# Patient Record
Sex: Male | Born: 1965 | Race: Black or African American | Hispanic: No | Marital: Single | Smoking: Never smoker
Health system: Southern US, Community
[De-identification: ages and names within clinical notes are randomized; demographics above are authoritative.]

## PROBLEM LIST (undated history)

## (undated) DIAGNOSIS — F1020 Alcohol dependence, uncomplicated: Secondary | ICD-10-CM

## (undated) DIAGNOSIS — F1010 Alcohol abuse, uncomplicated: Secondary | ICD-10-CM

## (undated) DIAGNOSIS — K7460 Unspecified cirrhosis of liver: Secondary | ICD-10-CM

## (undated) DIAGNOSIS — K7030 Alcoholic cirrhosis of liver without ascites: Secondary | ICD-10-CM

## (undated) DIAGNOSIS — M1990 Unspecified osteoarthritis, unspecified site: Secondary | ICD-10-CM

---

## 2004-02-07 ENCOUNTER — Emergency Department: Payer: Self-pay | Admitting: Emergency Medicine

## 2010-10-25 ENCOUNTER — Emergency Department: Payer: Self-pay | Admitting: Emergency Medicine

## 2012-12-09 ENCOUNTER — Emergency Department: Payer: Self-pay | Admitting: Emergency Medicine

## 2014-11-28 ENCOUNTER — Emergency Department
Admission: EM | Admit: 2014-11-28 | Discharge: 2014-11-28 | Disposition: A | Discharge status: Home / Self Care | Payer: Self-pay | Attending: Emergency Medicine | Admitting: Emergency Medicine

## 2014-11-28 DIAGNOSIS — F10120 Alcohol abuse with intoxication, uncomplicated: Secondary | ICD-10-CM | POA: Insufficient documentation

## 2014-11-28 DIAGNOSIS — F10920 Alcohol use, unspecified with intoxication, uncomplicated: Secondary | ICD-10-CM

## 2014-11-28 LAB — BASIC METABOLIC PANEL
Anion gap: 16 — ABNORMAL HIGH (ref 5–15)
BUN: 7 mg/dL (ref 6–20)
CALCIUM: 8.5 mg/dL — AB (ref 8.9–10.3)
CHLORIDE: 98 mmol/L — AB (ref 101–111)
CO2: 22 mmol/L (ref 22–32)
CREATININE: 0.64 mg/dL (ref 0.61–1.24)
GFR calc Af Amer: >60 mL/min (ref >60)
GFR calc non Af Amer: >60 mL/min (ref >60)
Glucose, Bld: 153 mg/dL — ABNORMAL HIGH (ref 65–99)
Potassium: 3.4 mmol/L — ABNORMAL LOW (ref 3.5–5.1)
SODIUM: 136 mmol/L (ref 135–145)

## 2014-11-28 LAB — ETHANOL: Alcohol, Ethyl (B): 387 mg/dL (ref <5)

## 2014-11-28 MED ORDER — SODIUM CHLORIDE 0.9 % IV BOLUS (SEPSIS)
1000.0000 mL | Freq: Once | INTRAVENOUS | Status: AC
  Administered 2014-11-28: 1000 mL via INTRAVENOUS

## 2014-11-28 NOTE — ED Notes (Signed)
Patient brought in by [place] police for to be cleared for jail.

## 2014-11-28 NOTE — ED Provider Notes (Signed)
-----------------------------------------   8:23 AM on 11/28/2014 -----------------------------------------  Police breathalyzer indicates alcohol level of 210, well below 300. Patient fully awake and alert reports that he feels ready to go. Heart rate 95, fully awake and alert in no distress. He'll be going in the custody of the police to jail.  Alcohol intoxication return precautions given and follow-up with [facility] advised once he is out of custody of the police.  Delman Kitten, MD 11/28/14 [phone]

## 2014-11-28 NOTE — ED Notes (Signed)
Pt placed on O2 [place] 2 LPM for oxygen saturation on 92 in RA.

## 2014-11-28 NOTE — Discharge Instructions (Signed)
Alcohol Intoxication Alcohol intoxication occurs when the amount of alcohol that a person has consumed impairs his or her ability to mentally and physically function. Alcohol directly impairs the normal chemical activity of the brain. Drinking large amounts of alcohol can lead to changes in mental function and behavior, and it can cause many physical effects that can be harmful.  Alcohol intoxication can range in severity from mild to very severe. Various factors can affect the level of intoxication that occurs, such as the person's age, gender, weight, frequency of alcohol consumption, and the presence of other medical conditions (such as diabetes, seizures, or heart conditions). Dangerous levels of alcohol intoxication may occur when people drink large amounts of alcohol in a short period (binge drinking). Alcohol can also be especially dangerous when combined with certain prescription medicines or "recreational" drugs. SIGNS AND SYMPTOMS Some common signs and symptoms of mild alcohol intoxication include:  Loss of coordination.  Changes in mood and behavior.  Impaired judgment.  Slurred speech. As alcohol intoxication progresses to more severe levels, other signs and symptoms will appear. These may include:  Vomiting.  Confusion and impaired memory.  Slowed breathing.  Seizures.  Loss of consciousness. DIAGNOSIS  Your health care provider will take a medical history and perform a physical exam. You will be asked about the amount and type of alcohol you have consumed. Blood tests will be done to measure the concentration of alcohol in your blood. In many places, your blood alcohol level must be lower than 80 mg/dL (0.08%) to legally drive. However, many dangerous effects of alcohol can occur at much lower levels.  TREATMENT  People with alcohol intoxication often do not require treatment. Most of the effects of alcohol intoxication are temporary, and they go away as the alcohol naturally  leaves the body. Your health care provider will monitor your condition until you are stable enough to go home. Fluids are sometimes given through an IV access tube to help prevent dehydration.  HOME CARE INSTRUCTIONS  Do not drive after drinking alcohol.  Stay hydrated. Drink enough water and fluids to keep your urine clear or pale yellow. Avoid caffeine.   Only take over-the-counter or prescription medicines as directed by your health care provider.  SEEK MEDICAL CARE IF:   You have persistent vomiting.   You do not feel better after a few days.  You have frequent alcohol intoxication. Your health care provider can help determine if you should see a substance use treatment counselor. SEEK IMMEDIATE MEDICAL CARE IF:   You become shaky or tremble when you try to stop drinking.   You shake uncontrollably (seizure).   You throw up (vomit) blood. This may be bright red or may look like black coffee grounds.   You have blood in your stool. This may be bright red or may appear as a black, tarry, bad smelling stool.   You become lightheaded or faint.  MAKE SURE YOU:   Understand these instructions.  Will watch your condition.  Will get help right away if you are not doing well or get worse.   This information is not intended to replace advice given to you by your health care provider. Make sure you discuss any questions you have with your health care provider.   Document Released: 10/04/2004 Document Revised: 08/27/2012 Document Reviewed: 05/30/2012 Elsevier Interactive Patient Education 2016 [employer]  Ethanol Test WHY AM I HAVING THIS TEST? This test is used to determine whether your blood alcohol level can impair  your ability to drive. It can also determine the possibility of overdose. WHAT KIND OF SAMPLE IS TAKEN? A blood sample is required for this test. It is usually collected by inserting a needle into a vein. HOW DO I PREPARE FOR THE TEST? There is no  preparation required for this test. WHAT ARE THE REFERENCE RANGES? Reference ranges are considered healthy ranges established after testing a large group of healthy people. Reference ranges may vary among different people, labs, and hospitals. It is your responsibility to obtain your test results. Ask the lab or department performing the test when and how you will get your test results. Reference ranges are 0-50 mg/dL or 0-0.05%. WHAT DO THE RESULTS MEAN? If your results are:  Greater than or equal to 80 mg/dL or 0.08%, this indicates legal intoxication.  80-400 mg/dL or 0.08-0.4%, this means that you have increased intoxication. You may also have depressed central nervous response.  Greater than 400 mg/dL or 0.4%, this indicates possible alcohol overdose. Talk with your health care provider to discuss your results, treatment options, and if necessary, the need for more tests. Talk with your health care provider if you have any questions about your results.   This information is not intended to replace advice given to you by your health care provider. Make sure you discuss any questions you have with your health care provider.   Document Released: 01/18/2004 Document Revised: 01/15/2014 Document Reviewed: 06/16/2013 Elsevier Interactive Patient Education [employer].

## 2014-11-28 NOTE — ED Provider Notes (Signed)
[facility] Emergency Department Provider Note  ____________________________________________  Time seen: Approximately [id number] AM  I have reviewed the triage vital signs and the nursing notes.   HISTORY  Chief Complaint Alcohol Intoxication    HPI Jordan Hayden is a 49 y.o. male who was brought in by police with alcohol intoxication. The patient reports he was at a cookout and he had some liquor. The patient reports that he feels better that he had when he first came in. The patient is unsure how much she was drinking. He reports that he drinks about a beer daily. The patient denies any vomiting or dizziness. He also denies any smoking. The patient has some left knee pain with some mild swelling that he is unsure what the cause. The patient was brought in by the police because when they took him to jail his blood alcohol level was initially in the 340s and then seemed to rise into the 450s. The patient's alcohol level was too high for him to be safely monitored details and he was brought in here for medical clearance and will be taken back once he is medically cleared. The patient has no complaints or concerns at this time and is sitting without any distress on the stretcher.   No past medical history  There are no active problems to display for this patient.   No past surgical history   No current outpatient prescriptions on file.  Allergies No known drug allergies  No family history on file.  Social History Social History  Substance Use Topics  . Smoking status: Smoker  . Smokeless tobacco: Not on file  . Alcohol Use: Daily Drinker    Review of Systems Constitutional: No fever/chills Eyes: No visual changes. ENT: No sore throat. Cardiovascular: Denies chest pain. Respiratory: Denies shortness of breath. Gastrointestinal: No abdominal pain.  No nausea, no vomiting.  No diarrhea.  No constipation. Genitourinary: Negative for  dysuria. Musculoskeletal: Negative for back pain. Skin: Negative for rash. Neurological: Negative for headaches, focal weakness or numbness.  10-point ROS otherwise negative.  ____________________________________________   PHYSICAL EXAM:  VITAL SIGNS: ED Triage Vitals  Enc Vitals Group     BP 11/28/14 0516 157/91 mmHg     Pulse Rate 11/28/14 0516 122     Resp 11/28/14 0516 20     Temp 11/28/14 0516 98.1 F (36.7 C)     Temp Source 11/28/14 0516 Oral     SpO2 11/28/14 0516 94 %     Weight 11/28/14 0515 185 lb (83.915 kg)     Height 11/28/14 0515 5\' 8"  (1.727 m)     Head Cir --      Peak Flow --      Pain Score 11/28/14 0518 8     Pain Loc --      Pain Edu? --      Excl. in [place]? --     Constitutional: Alert and oriented. Well appearing and in no acute distress. Eyes: Conjunctivae are normal. PERRL. EOMI. Head: Atraumatic. Nose: No congestion/rhinnorhea. Mouth/Throat: Mucous membranes are moist.  Oropharynx non-erythematous. Cardiovascular: Normal rate, regular rhythm. Grossly normal heart sounds.  Good peripheral circulation. Respiratory: Normal respiratory effort.  No retractions. Lungs CTAB. Gastrointestinal: Soft and nontender. No distention. Positive bowel sounds Musculoskeletal: No lower extremity tenderness nor edema.   Neurologic:  Normal speech and language.  Skin:  Skin is warm, dry and intact.  Psychiatric: Mood and affect are normal.   ____________________________________________   LABS (all labs  ordered are listed, but only abnormal results are displayed)  Labs Reviewed  ETHANOL - Abnormal; Notable for the following:    Alcohol, Ethyl (B) 387 (*)    All other components within normal limits  BASIC METABOLIC PANEL - Abnormal; Notable for the following:    Potassium 3.4 (*)    Chloride 98 (*)    Glucose, Bld 153 (*)    Calcium 8.5 (*)    Anion gap 16 (*)    All other components within normal limits    ____________________________________________  EKG  none ____________________________________________  RADIOLOGY  none ____________________________________________   PROCEDURES  Procedure(s) performed: None  Critical Care performed: No  ____________________________________________   INITIAL IMPRESSION / ASSESSMENT AND PLAN / ED COURSE  Pertinent labs & imaging results that were available during my care of the patient were reviewed by me and considered in my medical decision making (see chart for details).  This is a patient who is intoxicated in the custody of police. We will give him some fluid and reassess the patient.  The patient will be reassessed to see if he is able to be discharged into the care of the police. Dr. Jacqualine Code will follow up the results of the breathalyzer. He continues to be in no distress.  ____________________________________________   FINAL CLINICAL IMPRESSION(S) / ED DIAGNOSES  Final diagnoses:  Alcohol intoxication, uncomplicated (HCC)      Loney Hering, MD 11/28/14 [phone]

## 2014-11-28 NOTE — ED Notes (Signed)
Pt arrived to the ED under [employer] custody for medical clearance for jail. Pt is AOx4 anxious during assessment. No other complaints during assessment.

## 2015-12-29 ENCOUNTER — Emergency Department
Admission: EM | Admit: 2015-12-29 | Discharge: 2015-12-29 | Disposition: A | Discharge status: Home / Self Care | Payer: Medicaid Other | Attending: Emergency Medicine | Admitting: Emergency Medicine

## 2015-12-29 ENCOUNTER — Emergency Department: Payer: Medicaid Other

## 2015-12-29 DIAGNOSIS — K7031 Alcoholic cirrhosis of liver with ascites: Secondary | ICD-10-CM | POA: Insufficient documentation

## 2015-12-29 DIAGNOSIS — R609 Edema, unspecified: Secondary | ICD-10-CM | POA: Diagnosis not present

## 2015-12-29 DIAGNOSIS — M7989 Other specified soft tissue disorders: Secondary | ICD-10-CM | POA: Diagnosis present

## 2015-12-29 LAB — COMPREHENSIVE METABOLIC PANEL
ALT: 23 U/L (ref 17–63)
AST: 82 U/L — ABNORMAL HIGH (ref 15–41)
Albumin: 1.9 g/dL — ABNORMAL LOW (ref 3.5–5.0)
Alkaline Phosphatase: 176 U/L — ABNORMAL HIGH (ref 38–126)
Anion gap: 5 (ref 5–15)
BILIRUBIN TOTAL: 13.2 mg/dL — AB (ref 0.3–1.2)
BUN: <5 mg/dL — ABNORMAL LOW (ref 6–20)
CHLORIDE: 100 mmol/L — AB (ref 101–111)
CO2: 27 mmol/L (ref 22–32)
Calcium: 8.1 mg/dL — ABNORMAL LOW (ref 8.9–10.3)
Creatinine, Ser: 0.4 mg/dL — ABNORMAL LOW (ref 0.61–1.24)
Glucose, Bld: 103 mg/dL — ABNORMAL HIGH (ref 65–99)
POTASSIUM: 3.3 mmol/L — AB (ref 3.5–5.1)
Sodium: 132 mmol/L — ABNORMAL LOW (ref 135–145)
TOTAL PROTEIN: 7.4 g/dL (ref 6.5–8.1)

## 2015-12-29 LAB — CBC
HCT: 33.5 % — ABNORMAL LOW (ref 40.0–52.0)
Hemoglobin: 11.8 g/dL — ABNORMAL LOW (ref 13.0–18.0)
MCH: 36.3 pg — AB (ref 26.0–34.0)
MCHC: 35.4 g/dL (ref 32.0–36.0)
MCV: 102.5 fL — AB (ref 80.0–100.0)
PLATELETS: 84 10*3/uL — AB (ref 150–440)
RBC: 3.26 MIL/uL — ABNORMAL LOW (ref 4.40–5.90)
RDW: 18.7 % — AB (ref 11.5–14.5)
WBC: 7.7 10*3/uL (ref 3.8–10.6)

## 2015-12-29 LAB — BRAIN NATRIURETIC PEPTIDE: B NATRIURETIC PEPTIDE 5: 78 pg/mL (ref 0.0–100.0)

## 2015-12-29 LAB — PROTIME-INR
INR: 1.81
Prothrombin Time: 21.2 seconds — ABNORMAL HIGH (ref 11.4–15.2)

## 2015-12-29 LAB — ETHANOL

## 2015-12-29 LAB — TROPONIN I: Troponin I: <0.03 ng/mL (ref <0.03)

## 2015-12-29 MED ORDER — FUROSEMIDE 20 MG PO TABS: 20.0000 mg | ORAL_TABLET | Freq: Every day | ORAL | 11 refills | Status: DC

## 2015-12-29 MED ORDER — SPIRONOLACTONE 25 MG PO TABS: 25.0000 mg | ORAL_TABLET | Freq: Every day | ORAL | 11 refills | Status: DC

## 2015-12-29 NOTE — ED Triage Notes (Signed)
Pt ibn in with co bilat lower leg edema since last week. No hx of the same, denies any pain or shob.

## 2015-12-29 NOTE — ED Provider Notes (Signed)
[facility] Emergency Department Provider Note        Time seen: ----------------------------------------- 7:59 PM on 12/29/2015 -----------------------------------------    I have reviewed the triage vital signs and the nursing notes.   HISTORY  Chief Complaint Leg Swelling    HPI Jordan Hayden is a 50 y.o. male who presents to ER with bilateral lower extremity edema for the last week. He has not had a history of same, denies fluid or weight gain. He denies fevers, chills, chest pain or shortness of breath. Patient reports frequent alcohol intake but denies alcohol intake daily.   No past medical history on file.  There are no active problems to display for this patient.   No past surgical history on file.  Allergies Patient has no known allergies.  Social History Social History  Substance Use Topics  . Smoking status: Not on file  . Smokeless tobacco: Not on file  . Alcohol use Not on file    Review of Systems Constitutional: Negative for fever. Cardiovascular: Negative for chest pain. Respiratory: Negative for shortness of breath. Gastrointestinal: Negative for abdominal pain, vomiting and diarrhea. Genitourinary: Negative for dysuria. Musculoskeletal: Negative for back pain.Positive for lower extremity edema Skin: Negative for rash. Neurological: Negative for headaches, focal weakness or numbness.  10-point ROS otherwise negative.  ____________________________________________   PHYSICAL EXAM:  VITAL SIGNS: ED Triage Vitals [12/29/15 1905]  Enc Vitals Group     BP (!) 149/86     Pulse Rate 95     Resp 18     Temp 99.6 F (37.6 C)     Temp Source Oral     SpO2 100 %     Weight 190 lb (86.2 kg)     Height 5\' 8"  (1.727 m)     Head Circumference      Peak Flow      Pain Score      Pain Loc      Pain Edu?      Excl. in [place]?     Constitutional: Alert and oriented. Well appearing and in no distress. Eyes:  Conjunctivae are normal. PERRL. Normal extraocular movements. ENT   Head: Normocephalic and atraumatic.   Nose: No congestion/rhinnorhea.   Mouth/Throat: Mucous membranes are moist.   Neck: No stridor. Cardiovascular: Normal rate, regular rhythm. No murmurs, rubs, or gallops. Respiratory: Normal respiratory effort without tachypnea nor retractions. Breath sounds are clear and equal bilaterally. No wheezes/rales/rhonchi. Gastrointestinal: Soft and nontender. Normal bowel sounds Musculoskeletal: Nontender with normal range of motion in all extremities. Bilateral lower extremity pitting edema to the knees Neurologic:  Normal speech and language. No gross focal neurologic deficits are appreciated.  Skin:  Skin is warm, dry and intact. No rash noted. Psychiatric: Mood and affect are normal. Speech and behavior are normal.  ____________________________________________  EKG: Interpreted by me. Sinus rhythm rate 88 bpm, normal PR interval, normal QRS, normal QT, normal axis.  ____________________________________________  ED COURSE:  Pertinent labs & imaging results that were available during my care of the patient were reviewed by me and considered in my medical decision making (see chart for details). Clinical Course   Patient presents to the ER with edema which is likely cirrhosis related. We will assess with labs and reevaluate.  Procedures ____________________________________________   LABS (pertinent positives/negatives)  Labs Reviewed  CBC - Abnormal; Notable for the following:       Result Value   RBC 3.26 (*)    Hemoglobin 11.8 (*)  HCT 33.5 (*)    MCV 102.5 (*)    MCH 36.3 (*)    RDW 18.7 (*)    Platelets 84 (*)    All other components within normal limits  COMPREHENSIVE METABOLIC PANEL - Abnormal; Notable for the following:    Sodium 132 (*)    Potassium 3.3 (*)    Chloride 100 (*)    Glucose, Bld 103 (*)    BUN <5 (*)    Creatinine, Ser 0.40 (*)     Calcium 8.1 (*)    Albumin 1.9 (*)    AST 82 (*)    Alkaline Phosphatase 176 (*)    Total Bilirubin 13.2 (*)    All other components within normal limits  PROTIME-INR - Abnormal; Notable for the following:    Prothrombin Time 21.2 (*)    All other components within normal limits  BRAIN NATRIURETIC PEPTIDE  TROPONIN I  ETHANOL  URINALYSIS, COMPLETE (UACMP) WITH MICROSCOPIC    RADIOLOGY Images were viewed by me  Right upper quadrant ultrasound IMPRESSION: 1. Advanced cirrhosis with moderate ascites suggesting portal hypertension. 2. Gallbladder sludge with nonspecific gallbladder wall thickening (likely due to liver disease and ascites). No biliary dilatation or sonographic Murphy's sign.  ____________________________________________  FINAL ASSESSMENT AND PLAN  Cirrhosis, Peripheral edema, hyperbilirubinemia  Plan: Patient with labs and imaging as dictated above. Patient presents to the ER with symptoms and signs of cirrhosis. Ultrasound findings confirmed the same. I have advised the patient that he needs to be admitted and have his liver more thoroughly evaluated for GI consultation. He has declined and is leaving [place]. I will offer him diuretics to start taking and he is advised to return for worsening symptoms.   Earleen Newport, MD   Note: This dictation was prepared with Dragon dictation. Any transcriptional errors that result from this process are unintentional    Earleen Newport, MD 12/29/15 2223

## 2016-02-17 DIAGNOSIS — M720 Palmar fascial fibromatosis [Dupuytren]: Secondary | ICD-10-CM | POA: Insufficient documentation

## 2016-02-17 DIAGNOSIS — K7460 Unspecified cirrhosis of liver: Secondary | ICD-10-CM | POA: Insufficient documentation

## 2016-02-28 ENCOUNTER — Other Ambulatory Visit: Payer: Self-pay | Admitting: Gastroenterology

## 2016-02-28 ENCOUNTER — Ambulatory Visit
Admission: RE | Admit: 2016-02-28 | Discharge: 2016-02-28 | Disposition: A | Discharge status: Home / Self Care | Payer: Medicaid Other | Source: Ambulatory Visit | Attending: Gastroenterology | Admitting: Gastroenterology

## 2016-02-28 DIAGNOSIS — D6959 Other secondary thrombocytopenia: Secondary | ICD-10-CM | POA: Diagnosis present

## 2016-02-28 DIAGNOSIS — Z79899 Other long term (current) drug therapy: Secondary | ICD-10-CM

## 2016-02-28 DIAGNOSIS — E8809 Other disorders of plasma-protein metabolism, not elsewhere classified: Secondary | ICD-10-CM | POA: Diagnosis present

## 2016-02-28 DIAGNOSIS — D689 Coagulation defect, unspecified: Secondary | ICD-10-CM | POA: Diagnosis present

## 2016-02-28 DIAGNOSIS — Z833 Family history of diabetes mellitus: Secondary | ICD-10-CM

## 2016-02-28 DIAGNOSIS — K7031 Alcoholic cirrhosis of liver with ascites: Principal | ICD-10-CM | POA: Diagnosis present

## 2016-02-28 DIAGNOSIS — R601 Generalized edema: Secondary | ICD-10-CM | POA: Diagnosis present

## 2016-02-28 DIAGNOSIS — N5089 Other specified disorders of the male genital organs: Secondary | ICD-10-CM | POA: Diagnosis present

## 2016-02-28 DIAGNOSIS — D539 Nutritional anemia, unspecified: Secondary | ICD-10-CM | POA: Diagnosis present

## 2016-02-28 DIAGNOSIS — Z8249 Family history of ischemic heart disease and other diseases of the circulatory system: Secondary | ICD-10-CM

## 2016-02-28 DIAGNOSIS — E876 Hypokalemia: Secondary | ICD-10-CM | POA: Diagnosis present

## 2016-02-28 DIAGNOSIS — J90 Pleural effusion, not elsewhere classified: Secondary | ICD-10-CM | POA: Diagnosis present

## 2016-02-28 LAB — ALBUMIN, PLEURAL OR PERITONEAL FLUID

## 2016-02-28 LAB — PROTEIN, PLEURAL OR PERITONEAL FLUID

## 2016-02-28 NOTE — Discharge Instructions (Signed)
Paracentesis, Care After °Introduction °Refer to this sheet in the next few weeks. These instructions provide you with information about caring for yourself after your procedure. Your health care provider may also give you more specific instructions. Your treatment has been planned according to current medical practices, but problems sometimes occur. Call your health care provider if you have any problems or questions after your procedure. °What can I expect after the procedure? °After your procedure, it is common to have a small amount of clear fluid coming from the puncture site. °Follow these instructions at home: °· Return to your normal activities as told by your health care provider. Ask your health care provider what activities are safe for you. °· Take over-the-counter and prescription medicines only as told by your health care provider. °· Do not take baths, swim, or use a hot tub until your health care provider approves. °· Follow instructions from your health care provider about: °¨ How to take care of your puncture site. °¨ When and how you should change your bandage (dressing). °¨ When you should remove your dressing. °· Check your puncture area every day signs of infection. Watch for: °¨ Redness, swelling, or pain. °¨ Fluid, blood, or pus. °· Keep all follow-up visits as told by your health care provider. This is important. °Contact a health care provider if: °· You have redness, swelling, or pain at your puncture site. °· You start to have more clear fluid coming from your puncture site. °· You have blood or pus coming from your puncture site. °· You have chills. °· You have a fever. °Get help right away if: °· You develop chest pain or shortness of breath. °· You develop increasing pain, discomfort, or swelling in your abdomen. °· You feel dizzy or light-headed or you pass out. °This information is not intended to replace advice given to you by your health care provider. Make sure you discuss any  questions you have with your health care provider. °Document Released: 05/11/2014 Document Revised: 06/02/2015 Document Reviewed: 03/09/2014 °© 2017 Elsevier ° °

## 2016-02-29 ENCOUNTER — Inpatient Hospital Stay
Admission: EM | Admit: 2016-02-29 | Discharge: 2016-03-05 | DRG: 433 | Disposition: A | Discharge status: Home / Self Care | Payer: Medicaid Other | Attending: Internal Medicine | Admitting: Internal Medicine

## 2016-02-29 ENCOUNTER — Encounter: Payer: Self-pay | Admitting: *Deleted

## 2016-02-29 DIAGNOSIS — I5189 Other ill-defined heart diseases: Secondary | ICD-10-CM

## 2016-02-29 DIAGNOSIS — Z8249 Family history of ischemic heart disease and other diseases of the circulatory system: Secondary | ICD-10-CM | POA: Diagnosis not present

## 2016-02-29 DIAGNOSIS — E876 Hypokalemia: Secondary | ICD-10-CM | POA: Diagnosis present

## 2016-02-29 DIAGNOSIS — J90 Pleural effusion, not elsewhere classified: Secondary | ICD-10-CM | POA: Diagnosis present

## 2016-02-29 DIAGNOSIS — R601 Generalized edema: Secondary | ICD-10-CM

## 2016-02-29 DIAGNOSIS — D539 Nutritional anemia, unspecified: Secondary | ICD-10-CM | POA: Diagnosis present

## 2016-02-29 DIAGNOSIS — R140 Abdominal distension (gaseous): Secondary | ICD-10-CM

## 2016-02-29 DIAGNOSIS — Z79899 Other long term (current) drug therapy: Secondary | ICD-10-CM | POA: Diagnosis not present

## 2016-02-29 DIAGNOSIS — N5089 Other specified disorders of the male genital organs: Secondary | ICD-10-CM | POA: Diagnosis present

## 2016-02-29 DIAGNOSIS — R188 Other ascites: Secondary | ICD-10-CM

## 2016-02-29 DIAGNOSIS — D689 Coagulation defect, unspecified: Secondary | ICD-10-CM | POA: Diagnosis present

## 2016-02-29 DIAGNOSIS — Z833 Family history of diabetes mellitus: Secondary | ICD-10-CM | POA: Diagnosis not present

## 2016-02-29 DIAGNOSIS — D6959 Other secondary thrombocytopenia: Secondary | ICD-10-CM | POA: Diagnosis present

## 2016-02-29 DIAGNOSIS — K7031 Alcoholic cirrhosis of liver with ascites: Secondary | ICD-10-CM | POA: Diagnosis not present

## 2016-02-29 DIAGNOSIS — E8809 Other disorders of plasma-protein metabolism, not elsewhere classified: Secondary | ICD-10-CM | POA: Diagnosis present

## 2016-02-29 HISTORY — DX: Alcohol abuse, uncomplicated: F10.10

## 2016-02-29 HISTORY — DX: Unspecified cirrhosis of liver: K74.60

## 2016-02-29 LAB — COMPREHENSIVE METABOLIC PANEL
ALBUMIN: 1.7 g/dL — AB (ref 3.5–5.0)
ALT: 22 U/L (ref 17–63)
ANION GAP: 6 (ref 5–15)
AST: 74 U/L — ABNORMAL HIGH (ref 15–41)
Alkaline Phosphatase: 184 U/L — ABNORMAL HIGH (ref 38–126)
BILIRUBIN TOTAL: 6.1 mg/dL — AB (ref 0.3–1.2)
BUN: 7 mg/dL (ref 6–20)
CHLORIDE: 103 mmol/L (ref 101–111)
CO2: 27 mmol/L (ref 22–32)
Calcium: 7.9 mg/dL — ABNORMAL LOW (ref 8.9–10.3)
Creatinine, Ser: 1.02 mg/dL (ref 0.61–1.24)
GFR calc Af Amer: >60 mL/min (ref >60)
GFR calc non Af Amer: >60 mL/min (ref >60)
GLUCOSE: 106 mg/dL — AB (ref 65–99)
POTASSIUM: 3.3 mmol/L — AB (ref 3.5–5.1)
SODIUM: 136 mmol/L (ref 135–145)
TOTAL PROTEIN: 7.1 g/dL (ref 6.5–8.1)

## 2016-02-29 LAB — DIFFERENTIAL
Basophils Absolute: 0.1 10*3/uL (ref 0–0.1)
Basophils Relative: 1 %
EOS PCT: 5 %
Eosinophils Absolute: 0.3 10*3/uL (ref 0–0.7)
LYMPHS ABS: 1.2 10*3/uL (ref 1.0–3.6)
LYMPHS PCT: 20 %
MONO ABS: 0.5 10*3/uL (ref 0.2–1.0)
MONOS PCT: 9 %
NEUTROS ABS: 4.1 10*3/uL (ref 1.4–6.5)
Neutrophils Relative %: 65 %

## 2016-02-29 LAB — CBC
HEMATOCRIT: 32.1 % — AB (ref 40.0–52.0)
HEMOGLOBIN: 11.2 g/dL — AB (ref 13.0–18.0)
MCH: 36.3 pg — ABNORMAL HIGH (ref 26.0–34.0)
MCHC: 35 g/dL (ref 32.0–36.0)
MCV: 103.9 fL — ABNORMAL HIGH (ref 80.0–100.0)
Platelets: 84 10*3/uL — ABNORMAL LOW (ref 150–440)
RBC: 3.09 MIL/uL — ABNORMAL LOW (ref 4.40–5.90)
RDW: 15.9 % — AB (ref 11.5–14.5)
WBC: 6.2 10*3/uL (ref 3.8–10.6)

## 2016-02-29 LAB — MISC LABCORP TEST (SEND OUT): Labcorp test code: 19588

## 2016-02-29 LAB — TSH: TSH: 3.601 u[IU]/mL (ref 0.350–4.500)

## 2016-02-29 LAB — PROTIME-INR
INR: 2.2
Prothrombin Time: 24.8 seconds — ABNORMAL HIGH (ref 11.4–15.2)

## 2016-02-29 LAB — CYTOLOGY - NON PAP

## 2016-02-29 LAB — APTT: aPTT: 47 seconds — ABNORMAL HIGH (ref 24–36)

## 2016-02-29 MED ORDER — ONDANSETRON HCL 4 MG PO TABS: 4.0000 mg | ORAL_TABLET | Freq: Four times a day (QID) | ORAL | Status: DC | PRN

## 2016-02-29 MED ORDER — ONDANSETRON HCL 4 MG/2ML IJ SOLN: 4.0000 mg | Freq: Four times a day (QID) | INTRAMUSCULAR | Status: DC | PRN

## 2016-02-29 MED ORDER — SPIRONOLACTONE 25 MG PO TABS
25.0000 mg | ORAL_TABLET | Freq: Every day | ORAL | Status: DC
  Administered 2016-02-29 – 2016-03-02 (×3): 25 mg via ORAL
  Filled 2016-02-29 (×4): qty 1

## 2016-02-29 MED ORDER — FUROSEMIDE 10 MG/ML IJ SOLN
60.0000 mg | Freq: Two times a day (BID) | INTRAMUSCULAR | Status: DC
  Administered 2016-03-01 – 2016-03-03 (×5): 60 mg via INTRAVENOUS
  Filled 2016-02-29 (×5): qty 6

## 2016-02-29 MED ORDER — ACETAMINOPHEN 325 MG PO TABS: 650.0000 mg | ORAL_TABLET | Freq: Four times a day (QID) | ORAL | Status: DC | PRN

## 2016-02-29 MED ORDER — SODIUM CHLORIDE 0.9% FLUSH
3.0000 mL | INTRAVENOUS | Status: DC | PRN
  Administered 2016-03-03: 3 mL via INTRAVENOUS
  Filled 2016-02-29: qty 3

## 2016-02-29 MED ORDER — HEPARIN SODIUM (PORCINE) 5000 UNIT/ML IJ SOLN
5000.0000 [IU] | Freq: Three times a day (TID) | INTRAMUSCULAR | Status: DC
  Administered 2016-02-29 – 2016-03-04 (×9): 5000 [IU] via SUBCUTANEOUS
  Filled 2016-02-29 (×9): qty 1

## 2016-02-29 MED ORDER — ACETAMINOPHEN 650 MG RE SUPP: 650.0000 mg | Freq: Four times a day (QID) | RECTAL | Status: DC | PRN

## 2016-02-29 MED ORDER — FUROSEMIDE 10 MG/ML IJ SOLN
60.0000 mg | Freq: Once | INTRAMUSCULAR | Status: AC
  Administered 2016-02-29: 60 mg via INTRAVENOUS
  Filled 2016-02-29: qty 8

## 2016-02-29 MED ORDER — SODIUM CHLORIDE 0.9% FLUSH
3.0000 mL | Freq: Two times a day (BID) | INTRAVENOUS | Status: DC
  Administered 2016-02-29 – 2016-03-04 (×9): 3 mL via INTRAVENOUS

## 2016-02-29 MED ORDER — SODIUM CHLORIDE 0.9 % IV SOLN: 250.0000 mL | INTRAVENOUS | Status: DC | PRN

## 2016-02-29 MED ORDER — ALBUMIN HUMAN 25 % IV SOLN
25.0000 g | Freq: Four times a day (QID) | INTRAVENOUS | Status: AC
  Administered 2016-02-29 – 2016-03-01 (×3): 25 g via INTRAVENOUS
  Filled 2016-02-29 (×4): qty 100

## 2016-02-29 NOTE — ED Triage Notes (Signed)
Pt has cirrhosis, pt has a paracentesis yesterday, pt sent from [facility] today for increased fluid, 2 liters drawn off abdomen yesterday, pt reports fluid leaking from site, pt reports MD wanted pt admitted

## 2016-02-29 NOTE — H&P (Signed)
[facility] [place] NAME: Jordan Hayden    MR#:  [id number]  DATE OF BIRTH:  04-21-1965  DATE OF ADMISSION:  02/29/2016  PRIMARY CARE PHYSICIAN: Zaviyar Body, MD   REQUESTING/REFERRING PHYSICIAN: Shelba Flake MD  CHIEF COMPLAINT:   Chief Complaint  Patient presents with  . Abdominal Pain    HISTORY OF PRESENT ILLNESS: Jordan Hayden  is a 51 y.o. male with a known history of  Alcohol abuse and cirrhosis who actually had his first paracentesis yesterday. He went back for follow-up with Dr. Lytle Butte who evaluated the patient and noticed that he had significant amount of lower extremity swelling he has a significant amount of scrotal swelling and referred him to the emergency room for further evaluation. Patient does have anasarca from his waist below. Also has abdominal distention. He denies any fevers chills no abdominal pain nausea vomiting or diarrhea. Patient used to drink he stopped drinking 2 months ago PAST MEDICAL HISTORY:   Past Medical History:  Diagnosis Date  . Alcohol abuse   . Cirrhosis ([place])     PAST SURGICAL HISTORY: Denies any surgical history  SOCIAL HISTORY:  Social History  Substance Use Topics  . Smoking status: Never Smoker  . Smokeless tobacco: Never Used  . Alcohol use No    FAMILY HISTORY:  Family History  Problem Relation Age of Onset  . Hypertension Mother   . Diabetes Father     DRUG ALLERGIES: No Known Allergies  REVIEW OF SYSTEMS:   CONSTITUTIONAL: No fever, fatigue or weakness.  EYES: No blurred or double vision.  EARS, NOSE, AND THROAT: No tinnitus or ear pain.  RESPIRATORY: No cough, shortness of breath, wheezing or hemoptysis.  CARDIOVASCULAR: No chest pain, orthopnea, edema.  GASTROINTESTINAL: No nausea, vomiting, diarrhea or abdominal pain. Abdominal distention GENITOURINARY: No dysuria, hematuria.  Positive scrotal swelling ENDOCRINE: No polyuria, nocturia,  HEMATOLOGY: No  anemia, easy bruising or bleeding SKIN: No rash or lesion. MUSCULOSKELETAL: No joint pain or arthritis.   NEUROLOGIC: No tingling, numbness, weakness.  PSYCHIATRY: No anxiety or depression.   MEDICATIONS AT HOME:  Prior to Admission medications   Medication Sig Start Date End Date Taking? Authorizing Provider  furosemide (LASIX) 20 MG tablet Take 1 tablet (20 mg total) by mouth daily. 12/29/15 12/28/16  Earleen Newport, MD  spironolactone (ALDACTONE) 25 MG tablet Take 1 tablet (25 mg total) by mouth daily. 12/29/15 12/28/16  Earleen Newport, MD      PHYSICAL EXAMINATION:   VITAL SIGNS: Blood pressure 136/77, pulse (!) 108, temperature 98.4 F (36.9 C), temperature source Oral, resp. rate 20, height 5\' 8"  (1.727 m), weight 223 lb (101.2 kg), SpO2 100 %.  GENERAL:  51 y.o.-year-old patient lying in the bed with no acute distress.  EYES: Pupils equal, round, reactive to light and accommodation. No scleral icterus. Extraocular muscles intact.  HEENT: Head atraumatic, normocephalic. Oropharynx and nasopharynx clear.  NECK:  Supple, no jugular venous distention. No thyroid enlargement, no tenderness.  LUNGS: Normal breath sounds bilaterally, no wheezing, rales,rhonchi or crepitation. No use of accessory muscles of respiration.  CARDIOVASCULAR: S1, S2 normal. No murmurs, rubs, or gallops.  ABDOMEN: Soft, nontender, ascites noted Bowel sounds present. No organomegaly or mass.  EXTREMITIES: 3+ pedal edema, cyanosis, or clubbing.  NEUROLOGIC: Cranial nerves II through XII are intact. Muscle strength 5/5 in all extremities. Sensation intact. Gait not checked.  PSYCHIATRIC: The patient is alert and oriented x 3.  SKIN: No  obvious rash, lesion, or ulcer.   LABORATORY PANEL:   CBC No results for input(s): WBC, HGB, HCT, PLT, MCV, MCH, MCHC, RDW, LYMPHSABS, MONOABS, EOSABS, BASOSABS, BANDABS in the last 168 hours.  Invalid input(s): NEUTRABS,  BANDSABD ------------------------------------------------------------------------------------------------------------------  Chemistries  No results for input(s): NA, K, CL, CO2, GLUCOSE, BUN, CREATININE, CALCIUM, MG, AST, ALT, ALKPHOS, BILITOT in the last 168 hours.  Invalid input(s): GFRCGP ------------------------------------------------------------------------------------------------------------------ CrCl cannot be calculated (Patient's most recent lab result is older than the maximum 21 days allowed.). ------------------------------------------------------------------------------------------------------------------ No results for input(s): TSH, T4TOTAL, T3FREE, THYROIDAB in the last 72 hours.  Invalid input(s): FREET3   Coagulation profile No results for input(s): INR, PROTIME in the last 168 hours. ------------------------------------------------------------------------------------------------------------------- No results for input(s): DDIMER in the last 72 hours. -------------------------------------------------------------------------------------------------------------------  Cardiac Enzymes No results for input(s): CKMB, TROPONINI, MYOGLOBIN in the last 168 hours.  Invalid input(s): CK ------------------------------------------------------------------------------------------------------------------ Invalid input(s): POCBNP  ---------------------------------------------------------------------------------------------------------------  Urinalysis No results found for: COLORURINE, APPEARANCEUR, LABSPEC, PHURINE, GLUCOSEU, HGBUR, BILIRUBINUR, KETONESUR, PROTEINUR, UROBILINOGEN, NITRITE, LEUKOCYTESUR   RADIOLOGY: US Paracentesis  Result Date: 02/28/2016 CLINICAL DATA:  Alcoholic cirrhosis, abdominal ascites, distension. Paracentesis up to 2 L requested. EXAM: ULTRASOUND GUIDED PARACENTESIS TECHNIQUE: The procedure, risks (including but not limited to bleeding, infection,  organ damage ), benefits, and alternatives were explained to the patient. Questions regarding the procedure were encouraged and answered. The patient understands and consents to the procedure. Survey ultrasound of the abdomen was performed and an appropriate skin entry site in the right lateral abdomen was selected. Skin site was marked, prepped with chlorhexidine, and draped in usual sterile fashion, and infiltrated locally with 1% lidocaine. A Safe-T-Centesis sheath needle was advanced into the peritoneal space until fluid could be aspirated. The sheath was advanced and the needle removed. 2 L of clear yellowascites were aspirated. Sample sent for the requested laboratory studies. COMPLICATIONS: COMPLICATIONS none IMPRESSION: Technically successful ultrasound guided paracentesis, removing 2 L of ascites. Electronically Signed   By: Lucrezia Europe M.D.   On: 02/28/2016 13:23    EKG: Orders placed or performed during the hospital encounter of 02/29/16  . EKG 12-Lead  . EKG 12-Lead    IMPRESSION AND PLAN: Patient is a 51 year old African-American male with liver cirrhosis now presents with anasarca  1. Anasarca due to combination of liver cirrhosis and severe hypoalbuminemia I will grieve patient IV albumin We'll treat him with IV Lasix may need IV Lasix drip Also obtain echo showed that there is no cardiac dysfunction  2. Ascites which is distended We'll obtain paracentesis tomorrow  3. Liver cirrhosis continue supportive care  4. Miscellaneous SCDs for DVT prophylaxis    All the records are reviewed and case discussed with ED provider. Management plans discussed with the patient, family and they are in agreement.  CODE STATUS:    Code Status Orders        Start     Ordered   02/29/16 1725  Full code  Continuous     02/29/16 1726    Code Status History    Date Active Date Inactive Code Status Order ID Comments User Context   This patient has a current code status but no  historical code status.       TOTAL TIME TAKING CARE OF THIS PATIENT: 55 minutes.    Dustin Flock M.D on 02/29/2016 at 5:32 PM  Between 7am to 6pm - Pager - [phone]  After 6pm go to [email or web address] - password [facility]  Office  [phone]  CC: Primary care physician; Fynn Body, MD

## 2016-02-29 NOTE — ED Provider Notes (Signed)
[facility] Emergency Department Provider Note   ____________________________________________   I have reviewed the triage vital signs and the nursing notes.   HISTORY  Chief Complaint Abdominal Pain   History limited by: Not Limited   HPI Jordan Hayden is a 51 y.o. male who presents to the emergency department today because of GI concerns. The patient was seen in GI clinic today after having a paracentesis performed yesterday. He was seen again because of leakage at the paracentesis site. During the visit today they noticed that the swelling was worse and is now extending into the groin area. Given concern concern for failure of outpatient treatment they sent the patient to the emergency department. Patient states that he feels like his breathing has improved since the paracentesis yesterday.    Past Medical History:  Diagnosis Date  . Cirrhosis ([place])     There are no active problems to display for this patient.   History reviewed. No pertinent surgical history.  Prior to Admission medications   Medication Sig Start Date End Date Taking? Authorizing Provider  furosemide (LASIX) 20 MG tablet Take 1 tablet (20 mg total) by mouth daily. 12/29/15 12/28/16  Earleen Newport, MD  spironolactone (ALDACTONE) 25 MG tablet Take 1 tablet (25 mg total) by mouth daily. 12/29/15 12/28/16  Earleen Newport, MD    Allergies Patient has no known allergies.  No family history on file.  Social History Social History  Substance Use Topics  . Smoking status: Never Smoker  . Smokeless tobacco: Not on file  . Alcohol use No    Review of Systems  Constitutional: Negative for fever. Cardiovascular: Negative for chest pain. Respiratory: Negative for shortness of breath. Gastrointestinal: Positive for abdominal swelling. Genitourinary: Positive for scrotal and penile swelling. Musculoskeletal: Negative for back pain. Skin: Negative for  rash. Neurological: Negative for headaches, focal weakness or numbness.  10-point ROS otherwise negative.  ____________________________________________   PHYSICAL EXAM:  VITAL SIGNS: ED Triage Vitals [02/29/16 1627]  Enc Vitals Group     BP 136/77     Pulse Rate (!) 108     Resp 20     Temp 98.4 F (36.9 C)     Temp Source Oral     SpO2 100 %     Weight 223 lb (101.2 kg)     Height 5\' 8"  (1.727 m)    Constitutional: Alert and oriented. Well appearing and in no distress. Eyes: Conjunctivae are normal. Normal extraocular movements. ENT   Head: Normocephalic and atraumatic.   Nose: No congestion/rhinnorhea.   Mouth/Throat: Mucous membranes are moist.   Neck: No stridor. Hematological/Lymphatic/Immunilogical: No cervical lymphadenopathy. Cardiovascular: Normal rate, regular rhythm.  No murmurs, rubs, or gallops.  Respiratory: Normal respiratory effort without tachypnea nor retractions. Breath sounds are clear and equal bilaterally. No wheezes/rales/rhonchi. Gastrointestinal: Distended. Bandage to the right lower quadrant. No tenderness. No rebound. No guarding.  Genitourinary: Scrotal and penile swelling. Musculoskeletal: Normal range of motion in all extremities. 2 + bilateral edema.  Neurologic:  Normal speech and language. No gross focal neurologic deficits are appreciated.  Skin:  Skin is warm, dry and intact. No rash noted. Psychiatric: Mood and affect are normal. Speech and behavior are normal. Patient exhibits appropriate insight and judgment.  ____________________________________________    LABS (pertinent positives/negatives)  Pending  ____________________________________________   EKG  None  ____________________________________________    RADIOLOGY  None  ____________________________________________   PROCEDURES  Procedures  ____________________________________________   INITIAL IMPRESSION / ASSESSMENT AND  PLAN / ED  COURSE  Pertinent labs & imaging results that were available during my care of the patient were reviewed by me and considered in my medical decision making (see chart for details).  Patient presented to the emergency department today because of concerns for anasarca. Patient will be admitted for further workup and evaluation.  ____________________________________________   FINAL CLINICAL IMPRESSION(S) / ED DIAGNOSES  Final diagnoses:  Anasarca     Note: This dictation was prepared with Dragon dictation. Any transcriptional errors that result from this process are unintentional     Nance Pear, MD 02/29/16 1715

## 2016-02-29 NOTE — ED Notes (Signed)
Patient denies pain and is resting comfortably.  

## 2016-03-01 ENCOUNTER — Inpatient Hospital Stay (HOSPITAL_COMMUNITY)
Admit: 2016-03-01 | Discharge: 2016-03-01 | Disposition: A | Discharge status: Home / Self Care | Payer: Medicaid Other | Attending: Internal Medicine | Admitting: Internal Medicine

## 2016-03-01 ENCOUNTER — Inpatient Hospital Stay: Payer: Medicaid Other

## 2016-03-01 DIAGNOSIS — R0600 Dyspnea, unspecified: Secondary | ICD-10-CM

## 2016-03-01 LAB — ECHOCARDIOGRAM COMPLETE
CHL CUP MV DEC (S): 176
E decel time: 176 msec
EERAT: 5.33
FS: 55 % — AB (ref 28–44)
Height: 68 in
IV/PV OW: 1.07
LA ID, A-P, ES: 42 mm
LA diam end sys: 42 mm
LA diam index: 1.88 cm/m2
LA vol A4C: 74.4 ml
LA vol index: 30.5 mL/m2
LA vol: 68.2 mL
LV E/e' medial: 5.33
LV E/e'average: 5.33
LV e' LATERAL: 17.2 cm/s
MV Peak grad: 3 mmHg
MVPKAVEL: 89.2 m/s
MVPKEVEL: 91.7 m/s
PW: 7.59 mm — AB (ref 0.6–1.1)
RV LATERAL S' VELOCITY: 20 cm/s
TAPSE: 35 mm
TDI e' lateral: 17.2
TDI e' medial: 11
WEIGHTICAEL: 3568 [oz_av]

## 2016-03-01 LAB — BASIC METABOLIC PANEL
Anion gap: 5 (ref 5–15)
BUN: 7 mg/dL (ref 6–20)
CALCIUM: 7.6 mg/dL — AB (ref 8.9–10.3)
CO2: 27 mmol/L (ref 22–32)
CREATININE: 0.85 mg/dL (ref 0.61–1.24)
Chloride: 107 mmol/L (ref 101–111)
GFR calc Af Amer: >60 mL/min (ref >60)
GFR calc non Af Amer: >60 mL/min (ref >60)
Glucose, Bld: 89 mg/dL (ref 65–99)
Potassium: 3.4 mmol/L — ABNORMAL LOW (ref 3.5–5.1)
SODIUM: 139 mmol/L (ref 135–145)

## 2016-03-01 LAB — CBC
HEMATOCRIT: 28 % — AB (ref 40.0–52.0)
HEMOGLOBIN: 10.2 g/dL — AB (ref 13.0–18.0)
MCH: 37 pg — ABNORMAL HIGH (ref 26.0–34.0)
MCHC: 36.2 g/dL — ABNORMAL HIGH (ref 32.0–36.0)
MCV: 102.1 fL — AB (ref 80.0–100.0)
Platelets: 77 10*3/uL — ABNORMAL LOW (ref 150–440)
RBC: 2.75 MIL/uL — AB (ref 4.40–5.90)
RDW: 15.7 % — AB (ref 11.5–14.5)
WBC: 5.7 10*3/uL (ref 3.8–10.6)

## 2016-03-01 MED ORDER — POTASSIUM CHLORIDE CRYS ER 20 MEQ PO TBCR
20.0000 meq | EXTENDED_RELEASE_TABLET | Freq: Two times a day (BID) | ORAL | Status: DC
  Administered 2016-03-01 – 2016-03-05 (×9): 20 meq via ORAL
  Filled 2016-03-01 (×9): qty 1

## 2016-03-01 NOTE — Consult Note (Signed)
Please see full GI consult by Ms Jacqulyn Liner.  Patient presented to o/p clinic yesterday with anasarca and massive ascites.  Patient with h/o etoh abuse, apparently initially presenting with ascites in 12/17.  He states ETOH abstinence since then.  Currently undergoing diuresis and albumen replacement due to hypoalbuminemia and above. Echo done, awaiting final reading.  Please see GI consult for recommendations and for medication doses for o/p followup.  He will need to have close o/p fu with GI.  May consider repeat paracentesis in 2 days.  Still with substantial edema as well.  Will need to follow Na restriction and fluid restriction as o/p as well as I/p and daily weights and strict I/O.     Thank you to IM for help. Following with you Lollie Sails, MD Gastroenterology

## 2016-03-01 NOTE — Progress Notes (Signed)
Patient ID: Jordan Hayden, male   DOB: 1965-09-14, 51 y.o.   MRN: [id number]  [employer] PROGRESS NOTE  Jordan Hayden [id number] DOB: 06-11-1965 DOA: 02/29/2016 PCP: Cordale Body, MD  HPI/Subjective: Patient has had swelling since December. He was diagnosed with cirrhosis of the liver. Has not drank in alcohol since December. Scrotum and legs are so swollen that he can hardly walk.  Objective: Vitals:   03/01/16 0758 03/01/16 1346  BP: 125/63 122/74  Pulse: (!) 108 (!) 108  Resp: (!) 22 20  Temp: 98.7 F (37.1 C)     Filed Weights   02/29/16 1627  Weight: 101.2 kg (223 lb)    ROS: Review of Systems  Constitutional: Negative for chills and fever.  Eyes: Negative for blurred vision.  Respiratory: Positive for shortness of breath. Negative for cough.   Cardiovascular: Negative for chest pain.  Gastrointestinal: Positive for abdominal pain. Negative for constipation, diarrhea, nausea and vomiting.  Genitourinary: Negative for dysuria.  Musculoskeletal: Negative for joint pain.  Neurological: Negative for dizziness and headaches.   Exam: Physical Exam  Constitutional: He is oriented to person, place, and time.  HENT:  Nose: No mucosal edema.  Mouth/Throat: No oropharyngeal exudate or posterior oropharyngeal edema.  Eyes: Conjunctivae, EOM and lids are normal. Pupils are equal, round, and reactive to light.  Neck: No JVD present. Carotid bruit is not present. No edema present. No thyroid mass and no thyromegaly present.  Cardiovascular: S1 normal and S2 normal.  Exam reveals no gallop.   No murmur heard. Pulses:      Dorsalis pedis pulses are 2+ on the right side, and 2+ on the left side.  Respiratory: No respiratory distress. He has decreased breath sounds in the right lower field and the left lower field. He has no wheezes. He has no rhonchi. He has no rales.  GI: Soft. Bowel sounds are normal. He exhibits distension. There is no tenderness.   Genitourinary:  Genitourinary Comments: Penis and scrotum swelling 3+  Musculoskeletal:       Right knee: He exhibits swelling.       Left knee: He exhibits swelling.       Right ankle: He exhibits swelling.       Left ankle: He exhibits swelling.  Lymphadenopathy:    He has no cervical adenopathy.  Neurological: He is alert and oriented to person, place, and time. No cranial nerve deficit.  Skin: Skin is warm. No rash noted. Nails show no clubbing.  Psychiatric: He has a normal mood and affect.      Data Reviewed: Basic Metabolic Panel:  Recent Labs Lab 02/29/16 1826 03/01/16 0422  NA 136 139  K 3.3* 3.4*  CL 103 107  CO2 27 27  GLUCOSE 106* 89  BUN 7 7  CREATININE 1.02 0.85  CALCIUM 7.9* 7.6*   Liver Function Tests:  Recent Labs Lab 02/29/16 1826  AST 74*  ALT 22  ALKPHOS 184*  BILITOT 6.1*  PROT 7.1  ALBUMIN 1.7*   CBC:  Recent Labs Lab 02/29/16 1826 03/01/16 0422  WBC 6.2 5.7  NEUTROABS 4.1  --   HGB 11.2* 10.2*  HCT 32.1* 28.0*  MCV 103.9* 102.1*  PLT 84* 77*   BNP (last 3 results)  Recent Labs  12/29/15 1908  BNP 78.0      Recent Results (from the past 240 hour(s))  Aerobic/Anaerobic Culture (surgical/deep wound)     Status: None (Preliminary result)   Collection Time: 02/28/16 12:00 PM  Result  Value Ref Range Status   Specimen Description PERITONEAL  Final   Special Requests NONE  Final   Gram Stain   Final    WBC PRESENT, PREDOMINANTLY MONONUCLEAR NO ORGANISMS SEEN CYTOSPIN    Culture   Final    NO GROWTH 2 DAYS Performed at [facility], [address], [place], [place] [address]    Report Status PENDING  Incomplete      Scheduled Meds: . albumin human  25 g Intravenous Q6H  . furosemide  60 mg Intravenous BID  . heparin  5,000 Units Subcutaneous Q8H  . potassium chloride  20 mEq Oral BID  . sodium chloride flush  3 mL Intravenous Q12H  . spironolactone  25 mg Oral Daily    Assessment/Plan:  1. Severe  anasarca secondary to liver cirrhosis and severe hypoalbuminemia. The patient is receiving Lasix and albumin IV. Also spironolactone. Echocardiogram ordered. Continue to watch creatinine closely with diuresis. 2. 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 Patient has thrombocytopenia and coagulopathy and ascites. Check hepatitis profiles. Patient will need another paracentesis large volume while here. 3. Hypokalemia replace potassium orally with diuresis.  Code Status:     Code Status Orders        Start     Ordered   02/29/16 1725  Full code  Continuous     02/29/16 1726    Code Status History    Date Active Date Inactive Code Status Order ID Comments User Context   This patient has a current code status but no historical code status.     Disposition Plan: To be determined  Consultants:  Gastroenterology  Time spent: 25 minutes  [place], [place]

## 2016-03-01 NOTE — Consult Note (Signed)
Consultation  Referring Provider:     Dr Leslye Peer Admit date: 02/29/16 Consult date      03/01/16   Reason for Consultation:     Cirrhosis         HPI:   Jordan Hayden is a 51 y.o. 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 Seen in the GI clinic 2d ago to establish care for this:    was noted at the time he was diagnosed with liver cirrhosis about 2-45m ago when seen at [facility] 12/17 for edema: was recommended for him to be admitted for liver evaluation, but patient declined- work up included labs, [place], EKG. LImited US 12/17- moderate ascites/ advanced liver cirrhosis.No hepatoma.  PT.INR: 21.2/1.8. + thrombocytopenia, macrocytic anemia, hypoalbuminema, elevated alt and ast. Alt normal. There has been hyperbilirubinemia. BNP normal. He was given some diuretic prescriptions and advised to see a provider for this. At the time of visit he had significant ascites/edema- diagnostic paracentesis was arranged & done labs assessed/diuretics adjusted, and he was to follow up today. However, during a call placed to patient yesterday, it was noted he was having significant leakage from his paracentesis site. He was brought back to clinic for reassessment, and was subsequently admitted for ascites/anasarca management.  Of note labs 02/28/16 demonstrated improvement to bilirubin (6/7). He has immunity to HAV, no evidence of HBV/HCV. HIV negative.  Since admission, patient states he is feeling better and less swollen. States he has no abdominal pain, NV, dyspepsia, melena/hematochezia, nor any other GI related complaint. Paracentesis labs from yesterday with no SBP so far and no malignancy. He is afebrile and hemodynamically stable. He is receiving IV lasix and po spironolactone, albumin q6h. He has been started on Cipro twice weekly for SBP prophlyaxis. I&O total since admission is a net -1297ml.       Past Medical History:  Diagnosis Date  . Alcohol abuse   .  Cirrhosis ([place])     History reviewed. No pertinent surgical history.  Family History  Problem Relation Age of Onset  . Hypertension Mother   . Diabetes Father      Social History  Substance Use Topics  . Smoking status: Never Smoker  . Smokeless tobacco: Never Used  . Alcohol use No    Prior to Admission medications   Medication Sig Start Date End Date Taking? Authorizing Provider  aspirin 500 MG tablet Take 500 mg by mouth every 6 (six) hours as needed for pain.   Yes Historical Provider, MD  furosemide (LASIX) 20 MG tablet Take 1 tablet (20 mg total) by mouth daily. 12/29/15 12/28/16 Yes Earleen Newport, MD  KLOR-CON M20 20 MEQ tablet Take 20 mEq by mouth daily. Take daily when lasix is taken 02/17/16  Yes Historical Provider, MD  naproxen sodium (ANAPROX) 220 MG tablet Take 220 mg by mouth as needed.   Yes Historical Provider, MD  spironolactone (ALDACTONE) 25 MG tablet Take 1 tablet (25 mg total) by mouth daily. 12/29/15 12/28/16 Yes Earleen Newport, MD    Current Facility-Administered Medications  Medication Dose Route Frequency Provider Last Rate Last Dose  . 0.9 %  sodium chloride infusion  250 mL Intravenous PRN Dustin Flock, MD      . acetaminophen (TYLENOL) tablet 650 mg  650 mg Oral Q6H PRN Dustin Flock, MD       Or  . acetaminophen (TYLENOL) suppository 650 mg  650 mg Rectal Q6H PRN Dustin Flock, MD      .  albumin human 25 % solution 25 g  25 g Intravenous Q6H Dustin Flock, MD   25 g at 03/01/16 1116  . furosemide (LASIX) injection 60 mg  60 mg Intravenous BID Dustin Flock, MD   60 mg at 03/01/16 0912  . heparin injection 5,000 Units  5,000 Units Subcutaneous Q8H Dustin Flock, MD   5,000 Units at 03/01/16 0602  . ondansetron (ZOFRAN) tablet 4 mg  4 mg Oral Q6H PRN Dustin Flock, MD       Or  . ondansetron (ZOFRAN) injection 4 mg  4 mg Intravenous Q6H PRN Dustin Flock, MD      . potassium chloride SA (K-DUR,KLOR-CON) CR tablet 20 mEq  20 mEq Oral  BID Loletha Grayer, MD   20 mEq at 03/01/16 0912  . sodium chloride flush (NS) 0.9 % injection 3 mL  3 mL Intravenous Q12H Dustin Flock, MD   3 mL at 03/01/16 0916  . sodium chloride flush (NS) 0.9 % injection 3 mL  3 mL Intravenous PRN Dustin Flock, MD      . spironolactone (ALDACTONE) tablet 25 mg  25 mg Oral Daily Dustin Flock, MD   25 mg at 03/01/16 0912    Allergies as of 02/29/2016  . (No Known Allergies)     Review of Systems:    All systems reviewed and negative except where noted in HPI.      Physical Exam:  Vital signs in last 24 hours: Temp:  [98.1 F (36.7 C)-98.8 F (37.1 C)] 98.7 F (37.1 C) (02/22 0758) Pulse Rate:  [108-111] 108 (02/22 0758) Resp:  [18-22] 22 (02/22 0758) BP: (125-136)/(63-82) 125/63 (02/22 0758) SpO2:  [98 %-100 %] 98 % (02/22 0758) Weight:  [101.2 kg (223 lb)] 101.2 kg (223 lb) (02/21 1627) Last BM Date: 02/29/16 General:   Pleasant man in NAD Head:  Normocephalic and atraumatic. Eyes:   No icterus.   Conjunctiva pink. Ears:  Normal auditory acuity. Mouth: Mucosa pink moist, no lesions. Neck:  Supple; no masses felt Lungs: Respirations even and unlabored. Lungs clear to auscultation bilaterally.   No wheezes, crackles, or rhonchi.  Heart:  S1S2, RRR, no MRG. Edematous from stomach down, 1-2+- improved from 2d ago Abdomen:   Flat, soft, + ascites, nontender. Normal bowel sounds. No appreciable masses or hepatomegaly. No rebound signs or other peritoneal signs. Rectal:  Not performed.  Msk:  MAEW x4, No clubbing or cyanosis. Strength 5/5. Symmetrical without gross deformities. Neurologic:  Alert and  oriented x4;  Cranial nerves II-XII intact.  Skin:  Warm, dry, pink without significant lesions or rashes. Psych:  Alert and cooperative. Normal affect.  LAB RESULTS:  Recent Labs  02/29/16 1826 03/01/16 0422  WBC 6.2 5.7  HGB 11.2* 10.2*  HCT 32.1* 28.0*  PLT 84* 77*   BMET  Recent Labs  02/29/16 1826 03/01/16 0422  NA  136 139  K 3.3* 3.4*  CL 103 107  CO2 27 27  GLUCOSE 106* 89  BUN 7 7  CREATININE 1.02 0.85  CALCIUM 7.9* 7.6*   LFT  Recent Labs  02/29/16 1826  PROT 7.1  ALBUMIN 1.7*  AST 74*  ALT 22  ALKPHOS 184*  BILITOT 6.1*   PT/INR  Recent Labs  02/29/16 1739  LABPROT 24.8*  INR 2.20    STUDIES: US Paracentesis  Result Date: 02/28/2016 CLINICAL DATA:  Alcoholic cirrhosis, abdominal ascites, distension. Paracentesis up to 2 L requested. EXAM: ULTRASOUND GUIDED PARACENTESIS TECHNIQUE: The procedure, risks (including but not  limited to bleeding, infection, organ damage ), benefits, and alternatives were explained to the patient. Questions regarding the procedure were encouraged and answered. The patient understands and consents to the procedure. Survey ultrasound of the abdomen was performed and an appropriate skin entry site in the right lateral abdomen was selected. Skin site was marked, prepped with chlorhexidine, and draped in usual sterile fashion, and infiltrated locally with 1% lidocaine. A Safe-T-Centesis sheath needle was advanced into the peritoneal space until fluid could be aspirated. The sheath was advanced and the needle removed. 2 L of clear yellowascites were aspirated. Sample sent for the requested laboratory studies. COMPLICATIONS: COMPLICATIONS none IMPRESSION: Technically successful ultrasound guided paracentesis, removing 2 L of ascites. Electronically Signed   By: Lucrezia Europe M.D.   On: 02/28/2016 13:23       Impression / Plan:   1. Alcoholic cirrhosis with ascites/anasarca: CTP C, MELD 22. Agree with present diuretics- Agree with I&O, needs qd weights, 2l fluid restriction, 2gmna diet. Will arrange for paracentesis, 2-4L removal for tomorrow.  Recommend outpatient diuretics to be lasix 40mg  po bid and Aldactone 100mg  poqd.  Thank you very much for this consult. These services were provided by Stephens November, NP-C, in collaboration with Lollie Sails, MD,  with whom I have discussed this patient in full.   Stephens November, NP-C

## 2016-03-02 ENCOUNTER — Encounter: Payer: Self-pay | Admitting: Radiology

## 2016-03-02 ENCOUNTER — Inpatient Hospital Stay: Payer: Medicaid Other

## 2016-03-02 LAB — BASIC METABOLIC PANEL
ANION GAP: 4 — AB (ref 5–15)
BUN: 6 mg/dL (ref 6–20)
CHLORIDE: 107 mmol/L (ref 101–111)
CO2: 27 mmol/L (ref 22–32)
Calcium: 7.7 mg/dL — ABNORMAL LOW (ref 8.9–10.3)
Creatinine, Ser: 0.7 mg/dL (ref 0.61–1.24)
GFR calc Af Amer: >60 mL/min (ref >60)
GFR calc non Af Amer: >60 mL/min (ref >60)
GLUCOSE: 91 mg/dL (ref 65–99)
POTASSIUM: 3.7 mmol/L (ref 3.5–5.1)
Sodium: 138 mmol/L (ref 135–145)

## 2016-03-02 MED ORDER — IOPAMIDOL (ISOVUE-300) INJECTION 61%
75.0000 mL | Freq: Once | INTRAVENOUS | Status: AC | PRN
  Administered 2016-03-02: 75 mL via INTRAVENOUS

## 2016-03-02 MED ORDER — SPIRONOLACTONE 25 MG PO TABS
50.0000 mg | ORAL_TABLET | Freq: Every day | ORAL | Status: DC
  Administered 2016-03-03: 50 mg via ORAL
  Filled 2016-03-02: qty 2

## 2016-03-02 NOTE — Consult Note (Signed)
Subjective: Patient seen for cirrhosis, massive ascites and anasarca.  Patient is feeling some better, some decrease of earlier SOB.  Denies abdominal pain.  Urinating well, normal BM.  Continues with anasarca, improving, continues with ascites.  I/O negative not including paracentesis of over 6 liters in the last 3 days. .   Objective: Vital signs in last 24 hours: Temp:  [98.4 F (36.9 C)-98.9 F (37.2 C)] 98.4 F (36.9 C) (02/23 0746) Pulse Rate:  [98-103] 98 (02/23 0746) Resp:  [16-20] 16 (02/23 0450) BP: (103-109)/(60-66) 109/66 (02/23 0746) SpO2:  [97 %-99 %] 97 % (02/23 0746) Blood pressure 109/66, pulse 98, temperature 98.4 F (36.9 C), temperature source Oral, resp. rate 16, height 5\' 8"  (1.727 m), weight 101.2 kg (223 lb), SpO2 97 %.   Intake/Output from previous day: 02/22 0701 - 02/23 0700 In: 480 [P.O.:480] Out: 2700 [Urine:2700]  Intake/Output this shift: Total I/O In: 790 [P.O.:790] Out: 2000 [Urine:2000]   General appearance:  51 male no distress Resp:  cta Cardio:  rrr GI:  Ascites, bowel sounds positive, NT.  Extremities:  2-3 + edema   Lab Results: Results for orders placed or performed during the hospital encounter of 02/29/16 (from the past 24 hour(s))  Basic metabolic panel     Status: Abnormal   Collection Time: 03/02/16  7:33 AM  Result Value Ref Range   Sodium 138 135 - 145 mmol/L   Potassium 3.7 3.5 - 5.1 mmol/L   Chloride 107 101 - 111 mmol/L   CO2 27 22 - 32 mmol/L   Glucose, Bld 91 65 - 99 mg/dL   BUN 6 6 - 20 mg/dL   Creatinine, Ser 0.70 0.61 - 1.24 mg/dL   Calcium 7.7 (L) 8.9 - 10.3 mg/dL   GFR calc non Af Amer >60 >60 mL/min   GFR calc Af Amer >60 >60 mL/min   Anion gap 4 (L) 5 - 15      Recent Labs  02/29/16 1826 03/01/16 0422  WBC 6.2 5.7  HGB 11.2* 10.2*  HCT 32.1* 28.0*  PLT 84* 77*   BMET  Recent Labs  02/29/16 1826 03/01/16 0422 03/02/16 0733  NA 136 139 138  K 3.3* 3.4* 3.7  CL 103 107 107  CO2 27 27 27    GLUCOSE 106* 89 91  BUN 7 7 6   CREATININE 1.02 0.85 0.70  CALCIUM 7.9* 7.6* 7.7*   LFT  Recent Labs  02/29/16 1826  PROT 7.1  ALBUMIN 1.7*  AST 74*  ALT 22  ALKPHOS 184*  BILITOT 6.1*   PT/INR  Recent Labs  02/29/16 1739  LABPROT 24.8*  INR 2.20   Hepatitis Panel No results for input(s): HEPBSAG, HCVAB, HEPAIGM, HEPBIGM in the last 72 hours. C-Diff No results for input(s): CDIFFTOX in the last 72 hours. No results for input(s): CDIFFPCR in the last 72 hours.   Studies/Results: Ct Chest W Contrast  Result Date: 03/02/2016 CLINICAL DATA:  Shortness of breath. Improvement following paracentesis. EXAM: CT CHEST WITH CONTRAST TECHNIQUE: Multidetector CT imaging of the chest was performed during intravenous contrast administration. CONTRAST:  26mL ISOVUE-300 IOPAMIDOL (ISOVUE-300) INJECTION 61% COMPARISON:  No prior chest imaging. FINDINGS: Cardiovascular: Extensive coronary artery calcification. No aortic calcification identified. No pericardial fluid. Mediastinum/Nodes: No mediastinal or hilar mass or lymphadenopathy. Lungs/Pleura: Elevated right hemidiaphragm with mild atelectasis at the right lung base. Nondependent lung is clear. Moderate size left effusion layering dependently with dependent pulmonary atelectasis. Near complete collapse of left lower lobe. No focal lesions  seen in the aerated lung. Upper Abdomen: Advanced cirrhosis.  Large amount of ascites. Musculoskeletal: Ordinary degenerative changes affect the spine. IMPRESSION: Moderate left effusion layering dependently with dependent pulmonary atelectasis. Elevated right hemidiaphragm with some atelectasis the right lung base. Extensive coronary artery calcification. Large amount of ascites.  Advanced cirrhosis. Electronically Signed   By: Nelson Chimes M.D.   On: 03/02/2016 11:28   US Paracentesis  Result Date: 03/01/2016 INDICATION: 51 year old male with recurrent ascites EXAM: ULTRASOUND GUIDED  PARACENTESIS  MEDICATIONS: None. COMPLICATIONS: None immediate. PROCEDURE: Informed written consent was obtained from the patient after a discussion of the risks, benefits and alternatives to treatment. A timeout was performed prior to the initiation of the procedure. Initial ultrasound scanning demonstrates a large amount of ascites within the right lower abdominal quadrant. The right lower abdomen was prepped and draped in the usual sterile fashion. 1% lidocaine with epinephrine was used for local anesthesia. Following this, a 6 Fr Safe-T-Centesis catheter was introduced. An ultrasound image was saved for documentation purposes. The paracentesis was performed. The catheter was removed and a dressing was applied. The patient tolerated the procedure well without immediate post procedural complication. FINDINGS: A total of approximately 4300 mL straw-colored ascitic fluid was removed. Samples were sent to the laboratory as requested by the clinical team. IMPRESSION: Successful ultrasound-guided paracentesis yielding 4.3 liters of peritoneal fluid. Electronically Signed   By: Jacqulynn Cadet M.D.   On: 03/01/2016 17:46    Scheduled Inpatient Medications:   . furosemide  60 mg Intravenous BID  . heparin  5,000 Units Subcutaneous Q8H  . potassium chloride  20 mEq Oral BID  . sodium chloride flush  3 mL Intravenous Q12H  . spironolactone  25 mg Oral Daily    Continuous Inpatient Infusions:    PRN Inpatient Medications:  sodium chloride, acetaminophen **OR** acetaminophen, ondansetron **OR** ondansetron (ZOFRAN) IV, sodium chloride flush  Miscellaneous:   Assessment:  1) childs pugh class C cirrhosis with obviousd cirrhosis on imaging,  Hypoalbuminemia, large amount of ascites--slow improvement. 2) atypical result of ECHO  Noted, CT chest with atelectasis likely from pleural effusion and ascites/  Plan:  1) increase aldactone to 50 daily with increase to 100 Monday. Repeat paracentesis Monday of 4-6 liters as  tolerated. Convert iv lasix to po when clinically feasible.   I will not be available until Monday, Dr Marius Ditch on call if needed.  Thank you for IM management.   Lollie Sails MD 03/02/2016, 3:07 PM

## 2016-03-02 NOTE — Progress Notes (Signed)
Patient ID: Jordan Hayden, male   DOB: 01-23-1965, 51 y.o.   MRN: [id number]   [facility] PROGRESS NOTE  Jordan Hayden [id number] DOB: 17-Nov-1965 DOA: 02/29/2016 PCP: Lazarius Body, MD  HPI/Subjective: Patient states that his swelling has gotten better. He is urinating well. Breathing better.  Objective: Vitals:   03/02/16 0450 03/02/16 0746  BP: 104/61 109/66  Pulse: (!) 103 98  Resp: 16   Temp: 98.9 F (37.2 C) 98.4 F (36.9 C)    Filed Weights   02/29/16 1627  Weight: 101.2 kg (223 lb)    ROS: Review of Systems  Constitutional: Negative for chills and fever.  Eyes: Negative for blurred vision.  Respiratory: Positive for shortness of breath. Negative for cough.   Cardiovascular: Negative for chest pain.  Gastrointestinal: Negative for abdominal pain, constipation, diarrhea, nausea and vomiting.  Genitourinary: Negative for dysuria.  Musculoskeletal: Negative for joint pain.  Neurological: Negative for dizziness and headaches.   Exam: Physical Exam  Constitutional: He is oriented to person, place, and time.  HENT:  Nose: No mucosal edema.  Mouth/Throat: No oropharyngeal exudate or posterior oropharyngeal edema.  Eyes: Conjunctivae, EOM and lids are normal. Pupils are equal, round, and reactive to light.  Neck: No JVD present. Carotid bruit is not present. No edema present. No thyroid mass and no thyromegaly present.  Cardiovascular: S1 normal and S2 normal.  Exam reveals no gallop.   No murmur heard. Pulses:      Dorsalis pedis pulses are 2+ on the right side, and 2+ on the left side.  Respiratory: No respiratory distress. He has decreased breath sounds in the right lower field and the left lower field. He has no wheezes. He has no rhonchi. He has no rales.  GI: Soft. Bowel sounds are normal. He exhibits distension. There is no tenderness.  Genitourinary:  Genitourinary Comments: Penis and scrotum swelling 3+  Musculoskeletal:       Right  knee: He exhibits swelling.       Left knee: He exhibits swelling.       Right ankle: He exhibits swelling.       Left ankle: He exhibits swelling.  Lymphadenopathy:    He has no cervical adenopathy.  Neurological: He is alert and oriented to person, place, and time. No cranial nerve deficit.  Skin: Skin is warm. No rash noted. Nails show no clubbing.  Psychiatric: He has a normal mood and affect.      Data Reviewed: Basic Metabolic Panel:  Recent Labs Lab 02/29/16 1826 03/01/16 0422 03/02/16 0733  NA 136 139 138  K 3.3* 3.4* 3.7  CL 103 107 107  CO2 27 27 27   GLUCOSE 106* 89 91  BUN 7 7 6   CREATININE 1.02 0.85 0.70  CALCIUM 7.9* 7.6* 7.7*   Liver Function Tests:  Recent Labs Lab 02/29/16 1826  AST 74*  ALT 22  ALKPHOS 184*  BILITOT 6.1*  PROT 7.1  ALBUMIN 1.7*   CBC:  Recent Labs Lab 02/29/16 1826 03/01/16 0422  WBC 6.2 5.7  NEUTROABS 4.1  --   HGB 11.2* 10.2*  HCT 32.1* 28.0*  MCV 103.9* 102.1*  PLT 84* 77*   BNP (last 3 results)  Recent Labs  12/29/15 1908  BNP 78.0      Recent Results (from the past 240 hour(s))  Aerobic/Anaerobic Culture (surgical/deep wound)     Status: None (Preliminary result)   Collection Time: 02/28/16 12:00 PM  Result Value Ref Range Status   Specimen  Description PERITONEAL  Final   Special Requests NONE  Final   Gram Stain   Final    WBC PRESENT, PREDOMINANTLY MONONUCLEAR NO ORGANISMS SEEN CYTOSPIN    Culture   Final    NO GROWTH 3 DAYS Performed at [facility], [place] [address] [place], [place] [address]    Report Status PENDING  Incomplete      Scheduled Meds: . furosemide  60 mg Intravenous BID  . heparin  5,000 Units Subcutaneous Q8H  . potassium chloride  20 mEq Oral BID  . sodium chloride flush  3 mL Intravenous Q12H  . spironolactone  25 mg Oral Daily    Assessment/Plan:  1. Severe anasarca secondary to liver cirrhosis and severe hypoalbuminemia. The patient is receiving Lasix and  albumin IV. Also spironolactone. Echocardiogram ordered Showed normal ejection fraction but could not rule out a mass outside the heart. CT scan did not show any mass. Just showed pleural effusion and anasarca and ascites. 2. 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 Patient has thrombocytopenia and coagulopathy and ascites. Patient will need another paracentesis large volume while here. 3. Hypokalemia replace potassium orally with diuresis.  Code Status:     Code Status Orders        Start     Ordered   02/29/16 1725  Full code  Continuous     02/29/16 1726    Code Status History    Date Active Date Inactive Code Status Order ID Comments User Context   This patient has a current code status but no historical code status.     Disposition Plan: To be determined  Consultants:  Gastroenterology  Time spent: 24 minutes  Loletha Grayer  [employer]

## 2016-03-03 LAB — BASIC METABOLIC PANEL
Anion gap: 4 — ABNORMAL LOW (ref 5–15)
BUN: 7 mg/dL (ref 6–20)
CHLORIDE: 106 mmol/L (ref 101–111)
CO2: 26 mmol/L (ref 22–32)
CREATININE: 0.76 mg/dL (ref 0.61–1.24)
Calcium: 7.6 mg/dL — ABNORMAL LOW (ref 8.9–10.3)
Glucose, Bld: 91 mg/dL (ref 65–99)
POTASSIUM: 3.8 mmol/L (ref 3.5–5.1)
SODIUM: 136 mmol/L (ref 135–145)

## 2016-03-03 MED ORDER — SPIRONOLACTONE 25 MG PO TABS
50.0000 mg | ORAL_TABLET | Freq: Two times a day (BID) | ORAL | Status: DC
  Administered 2016-03-03 – 2016-03-04 (×3): 50 mg via ORAL
  Filled 2016-03-03: qty 1
  Filled 2016-03-03 (×4): qty 2

## 2016-03-03 MED ORDER — FUROSEMIDE 40 MG PO TABS
40.0000 mg | ORAL_TABLET | Freq: Two times a day (BID) | ORAL | Status: DC
  Administered 2016-03-03 – 2016-03-04 (×3): 40 mg via ORAL
  Filled 2016-03-03 (×4): qty 1

## 2016-03-03 NOTE — Progress Notes (Signed)
4oz of water total today from 7am to 5pm this does not include what is on tray

## 2016-03-03 NOTE — Progress Notes (Signed)
Subjective: Feels less distended in his abdomen. Making good urine. Denies any complaints.    Objective: Vital signs in last 24 hours: Vitals:   03/02/16 1925 03/03/16 0400 03/03/16 0726 03/03/16 1447  BP: 119/73 119/70 (!) 113/59 107/63  Pulse: 70 (!) 105 100 (!) 106  Resp: 16 16 16 16   Temp: 98.8 F (37.1 C) 99.3 F (37.4 C) 98.8 F (37.1 C)   TempSrc: Oral Oral Oral   SpO2: 99% 97% 96% 96%  Weight:      Height:       Weight change:   Intake/Output Summary (Last 24 hours) at 03/03/16 1520 Last data filed at 03/03/16 1414  Gross per 24 hour  Intake              723 ml  Output                0 ml  Net              723 ml     Exam: Heart:: S1S2 present Lungs: clear to auscultation Abdomen: soft, nontender, distended, normal bowel sounds   Lab Results: @LABTEST2 @ Micro Results: Recent Results (from the past 240 hour(s))  Aerobic/Anaerobic Culture (surgical/deep wound)     Status: None (Preliminary result)   Collection Time: 02/28/16 12:00 PM  Result Value Ref Range Status   Specimen Description PERITONEAL  Final   Special Requests NONE  Final   Gram Stain   Final    WBC PRESENT, PREDOMINANTLY MONONUCLEAR NO ORGANISMS SEEN CYTOSPIN    Culture   Final    NO GROWTH 2 DAYS NO ANAEROBES ISOLATED; CULTURE IN PROGRESS FOR 5 DAYS Performed at [facility], [address], [place], [place] [address]    Report Status PENDING  Incomplete   Studies/Results: Ct Chest W Contrast  Result Date: 03/02/2016 CLINICAL DATA:  Shortness of breath. Improvement following paracentesis. EXAM: CT CHEST WITH CONTRAST TECHNIQUE: Multidetector CT imaging of the chest was performed during intravenous contrast administration. CONTRAST:  66mL ISOVUE-300 IOPAMIDOL (ISOVUE-300) INJECTION 61% COMPARISON:  No prior chest imaging. FINDINGS: Cardiovascular: Extensive coronary artery calcification. No aortic calcification identified. No pericardial fluid. Mediastinum/Nodes: No  mediastinal or hilar mass or lymphadenopathy. Lungs/Pleura: Elevated right hemidiaphragm with mild atelectasis at the right lung base. Nondependent lung is clear. Moderate size left effusion layering dependently with dependent pulmonary atelectasis. Near complete collapse of left lower lobe. No focal lesions seen in the aerated lung. Upper Abdomen: Advanced cirrhosis.  Large amount of ascites. Musculoskeletal: Ordinary degenerative changes affect the spine. IMPRESSION: Moderate left effusion layering dependently with dependent pulmonary atelectasis. Elevated right hemidiaphragm with some atelectasis the right lung base. Extensive coronary artery calcification. Large amount of ascites.  Advanced cirrhosis. Electronically Signed   By: Nelson Chimes M.D.   On: 03/02/2016 11:28   Medications: I have reviewed the patient's current medications. Scheduled Meds: . furosemide  40 mg Oral BID  . heparin  5,000 Units Subcutaneous Q8H  . potassium chloride  20 mEq Oral BID  . sodium chloride flush  3 mL Intravenous Q12H  . spironolactone  50 mg Oral BID   Continuous Infusions: PRN Meds:.sodium chloride, acetaminophen **OR** acetaminophen, ondansetron **OR** ondansetron (ZOFRAN) IV, sodium chloride flush   Assessment: Active Problems:   Anasarca Ascites Cirrhosis   Plan: - Start po lasix 40mg  BID - Aldactone 50mg  BID - 2 gm sodium restriction - Abstinence from ETOH - Needs EGD as out pt for variceal screening - No  evidence of [place] on [place] in 12/2015, recommend [place] every 20months - Follow up with Dr Gustavo Lah as out pt for long term cirrhosis care   LOS: 3 days   Lekendrick Alpern 03/03/2016, 3:20 PM

## 2016-03-03 NOTE — Progress Notes (Signed)
Patient ID: Jordan Hayden, male   DOB: 12-04-1965, 51 y.o.   MRN: [id number]    [facility] PROGRESS NOTE  Jordan Hayden [id number] DOB: [birth year] DOA: 02/29/2016 PCP: Jordan Body, MD  HPI/Subjective: Patient feels his breathing is better. Still little bit short of breath. The swelling is a little bit better. But still swollen  Objective: Vitals:   03/03/16 0400 03/03/16 0726  BP: 119/70 (!) 113/59  Pulse: (!) 105 100  Resp: 16 16  Temp: 99.3 F (37.4 C) 98.8 F (37.1 C)    Filed Weights   02/29/16 1627  Weight: 101.2 kg (223 lb)    ROS: Review of Systems  Constitutional: Negative for chills and fever.  Eyes: Negative for blurred vision.  Respiratory: Positive for shortness of breath. Negative for cough.   Cardiovascular: Negative for chest pain.  Gastrointestinal: Negative for abdominal pain, constipation, diarrhea, nausea and vomiting.  Genitourinary: Negative for dysuria.  Musculoskeletal: Negative for joint pain.  Neurological: Negative for dizziness and headaches.   Exam: Physical Exam  Constitutional: He is oriented to person, place, and time.  HENT:  Nose: No mucosal edema.  Mouth/Throat: No oropharyngeal exudate or posterior oropharyngeal edema.  Eyes: Conjunctivae, EOM and lids are normal. Pupils are equal, round, and reactive to light.  Neck: No JVD present. Carotid bruit is not present. No edema present. No thyroid mass and no thyromegaly present.  Cardiovascular: S1 normal and S2 normal.  Exam reveals no gallop.   No murmur heard. Pulses:      Dorsalis pedis pulses are 2+ on the right side, and 2+ on the left side.  Respiratory: No respiratory distress. He has decreased breath sounds in the right lower field and the left lower field. He has no wheezes. He has no rhonchi. He has no rales.  GI: Soft. Bowel sounds are normal. He exhibits distension. There is no tenderness.  Genitourinary:  Genitourinary Comments: Penis and scrotum  swelling 3+  Musculoskeletal:       Right knee: He exhibits swelling.       Left knee: He exhibits swelling.       Right ankle: He exhibits swelling.       Left ankle: He exhibits swelling.  Lymphadenopathy:    He has no cervical adenopathy.  Neurological: He is alert and oriented to person, place, and time. No cranial nerve deficit.  Skin: Skin is warm. No rash noted. Nails show no clubbing.  Psychiatric: He has a normal mood and affect.      Data Reviewed: Basic Metabolic Panel:  Recent Labs Lab 02/29/16 1826 03/01/16 0422 03/02/16 0733 03/03/16 0412  NA 136 139 138 136  K 3.3* 3.4* 3.7 3.8  CL 103 107 107 106  CO2 27 27 27 26   GLUCOSE 106* 89 91 91  BUN 7 7 6 7   CREATININE 1.02 0.85 0.70 0.76  CALCIUM 7.9* 7.6* 7.7* 7.6*   Liver Function Tests:  Recent Labs Lab 02/29/16 1826  AST 74*  ALT 22  ALKPHOS 184*  BILITOT 6.1*  PROT 7.1  ALBUMIN 1.7*   CBC:  Recent Labs Lab 02/29/16 1826 03/01/16 0422  WBC 6.2 5.7  NEUTROABS 4.1  --   HGB 11.2* 10.2*  HCT 32.1* 28.0*  MCV 103.9* 102.1*  PLT 84* 77*   BNP (last 3 results)  Recent Labs  12/29/15 1908  BNP 78.0      Recent Results (from the past 240 hour(s))  Aerobic/Anaerobic Culture (surgical/deep wound)     Status:  None (Preliminary result)   Collection Time: 02/28/16 12:00 PM  Result Value Ref Range Status   Specimen Description PERITONEAL  Final   Special Requests NONE  Final   Gram Stain   Final    WBC PRESENT, PREDOMINANTLY MONONUCLEAR NO ORGANISMS SEEN CYTOSPIN    Culture   Final    NO GROWTH 2 DAYS NO ANAEROBES ISOLATED; CULTURE IN PROGRESS FOR 5 DAYS Performed at [facility], [place] [address] [place], [place] [address]    Report Status PENDING  Incomplete      Scheduled Meds: . furosemide  40 mg Oral BID  . heparin  5,000 Units Subcutaneous Q8H  . potassium chloride  20 mEq Oral BID  . sodium chloride flush  3 mL Intravenous Q12H  . spironolactone  50 mg Oral BID     Assessment/Plan:  1. Severe anasarca secondary to liver cirrhosis and severe hypoalbuminemia. The patient is receiving albumin IV.  Case discussed with gastroenterology today. Switch Lasix to 40 mg orally twice a day and increase spironolactone to 50 mg twice a day.  2. 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 Patient has thrombocytopenia and coagulopathy and ascites.  3. Hypokalemia replace potassium orally with diuresis.  Code Status:     Code Status Orders        Start     Ordered   02/29/16 1725  Full code  Continuous     02/29/16 1726    Code Status History    Date Active Date Inactive Code Status Order ID Comments User Context   This patient has a current code status but no historical code status.     Disposition Plan: To be determined  Consultants:  Gastroenterology  Time spent: 25 minutes  [place], [place]

## 2016-03-04 LAB — AEROBIC/ANAEROBIC CULTURE W GRAM STAIN (SURGICAL/DEEP WOUND): Culture: NO GROWTH

## 2016-03-04 LAB — AEROBIC/ANAEROBIC CULTURE (SURGICAL/DEEP WOUND)

## 2016-03-04 LAB — BASIC METABOLIC PANEL
Anion gap: 5 (ref 5–15)
BUN: 7 mg/dL (ref 6–20)
CO2: 27 mmol/L (ref 22–32)
Calcium: 7.7 mg/dL — ABNORMAL LOW (ref 8.9–10.3)
Chloride: 104 mmol/L (ref 101–111)
Creatinine, Ser: 1.02 mg/dL (ref 0.61–1.24)
Glucose, Bld: 86 mg/dL (ref 65–99)
POTASSIUM: 3.7 mmol/L (ref 3.5–5.1)
SODIUM: 136 mmol/L (ref 135–145)

## 2016-03-04 NOTE — Progress Notes (Signed)
Patient ID: Jordan Hayden, male   DOB: February 25, 1965, 51 y.o.   MRN: [id number]  [facility] PROGRESS NOTE  Iremide Meseke [id number] DOB: 1965-09-30 DOA: 02/29/2016 PCP: Ashir Body, MD  HPI/Subjective: Patient feeling better with regards to his breathing and his weight. His leg swelling is less. His scrotum swelling is less.  Objective: Vitals:   03/04/16 0337 03/04/16 0741  BP: 114/68 111/68  Pulse: (!) 105 99  Resp: 18 18  Temp: 98.6 F (37 C) 99.1 F (37.3 C)    Filed Weights   02/29/16 1627 03/04/16 0337  Weight: 101.2 kg (223 lb) 91.9 kg (202 lb 11.2 oz)    ROS: Review of Systems  Constitutional: Negative for chills and fever.  Eyes: Negative for blurred vision.  Respiratory: Negative for cough and shortness of breath.   Cardiovascular: Negative for chest pain.  Gastrointestinal: Negative for abdominal pain, constipation, diarrhea, nausea and vomiting.  Genitourinary: Negative for dysuria.  Musculoskeletal: Negative for joint pain.  Neurological: Negative for dizziness and headaches.   Exam: Physical Exam  Constitutional: He is oriented to person, place, and time.  HENT:  Nose: No mucosal edema.  Mouth/Throat: No oropharyngeal exudate or posterior oropharyngeal edema.  Eyes: Conjunctivae, EOM and lids are normal. Pupils are equal, round, and reactive to light.  Neck: No JVD present. Carotid bruit is not present. No edema present. No thyroid mass and no thyromegaly present.  Cardiovascular: S1 normal and S2 normal.  Tachycardia present.  Exam reveals no gallop.   No murmur heard. Pulses:      Dorsalis pedis pulses are 2+ on the right side, and 2+ on the left side.  Respiratory: No respiratory distress. He has no wheezes. He has no rhonchi. He has no rales.  GI: Soft. Bowel sounds are normal. He exhibits distension and fluid wave. There is no tenderness.  Genitourinary:  Genitourinary Comments: 3+ scrotal and penile swelling  Musculoskeletal:     Right knee: He exhibits swelling.       Left knee: He exhibits swelling.       Right ankle: He exhibits swelling.       Left ankle: He exhibits swelling.  Lymphadenopathy:    He has no cervical adenopathy.  Neurological: He is alert and oriented to person, place, and time. No cranial nerve deficit.  Skin: Skin is warm. No rash noted. Nails show no clubbing.  Psychiatric: He has a normal mood and affect.      Data Reviewed: Basic Metabolic Panel:  Recent Labs Lab 02/29/16 1826 03/01/16 0422 03/02/16 0733 03/03/16 0412 03/04/16 0347  NA 136 139 138 136 136  K 3.3* 3.4* 3.7 3.8 3.7  CL 103 107 107 106 104  CO2 27 27 27 26 27   GLUCOSE 106* 89 91 91 86  BUN 7 7 6 7 7   CREATININE 1.02 0.85 0.70 0.76 1.02  CALCIUM 7.9* 7.6* 7.7* 7.6* 7.7*   Liver Function Tests:  Recent Labs Lab 02/29/16 1826  AST 74*  ALT 22  ALKPHOS 184*  BILITOT 6.1*  PROT 7.1  ALBUMIN 1.7*   CBC:  Recent Labs Lab 02/29/16 1826 03/01/16 0422  WBC 6.2 5.7  NEUTROABS 4.1  --   HGB 11.2* 10.2*  HCT 32.1* 28.0*  MCV 103.9* 102.1*  PLT 84* 77*   BNP (last 3 results)  Recent Labs  12/29/15 1908  BNP 78.0      Recent Results (from the past 240 hour(s))  Aerobic/Anaerobic Culture (surgical/deep wound)  Status: None   Collection Time: 02/28/16 12:00 PM  Result Value Ref Range Status   Specimen Description PERITONEAL  Final   Special Requests NONE  Final   Gram Stain   Final    WBC PRESENT, PREDOMINANTLY MONONUCLEAR NO ORGANISMS SEEN CYTOSPIN    Culture   Final    No growth aerobically or anaerobically. Performed at [facility], [place] [address] [place], [place] [address]    Report Status 03/04/2016 FINAL  Final      Scheduled Meds: . furosemide  40 mg Oral BID  . potassium chloride  20 mEq Oral BID  . sodium chloride flush  3 mL Intravenous Q12H  . spironolactone  50 mg Oral BID    Assessment/Plan:  1. Severe anasarca secondary to liver cirrhosis and severe  hypoalbuminemia. Patient was switched to Lasix 40 mg orally twice a day and spironolactone was increased to 50 mg twice a day. Patient will have a paracentesis  Monday and potentially can go home after that. 2. 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 Patient also has thrombocytopenia, coagulopathy and ascites. Patient will have a paracentesis on Monday and potentially go home after that. 3. Hypokalemia. Potassium replaced with diuresis. Since the patient spironolactone dose is increased and hopefully sent home on potassium replacement.  Code Status:     Code Status Orders        Start     Ordered   02/29/16 1725  Full code  Continuous     02/29/16 1726    Code Status History    Date Active Date Inactive Code Status Order ID Comments User Context   This patient has a current code status but no historical code status.     Disposition Plan: potentially home tomorrow  After paracentesis  Consultants:  gastroenterology  Time spent: 25 minutes  [place], [place]

## 2016-03-05 ENCOUNTER — Inpatient Hospital Stay: Payer: Medicaid Other

## 2016-03-05 LAB — BASIC METABOLIC PANEL
Anion gap: 4 — ABNORMAL LOW (ref 5–15)
BUN: 7 mg/dL (ref 6–20)
CO2: 26 mmol/L (ref 22–32)
Calcium: 7.5 mg/dL — ABNORMAL LOW (ref 8.9–10.3)
Chloride: 104 mmol/L (ref 101–111)
Creatinine, Ser: 0.92 mg/dL (ref 0.61–1.24)
GFR calc Af Amer: >60 mL/min (ref >60)
GFR calc non Af Amer: >60 mL/min (ref >60)
Glucose, Bld: 98 mg/dL (ref 65–99)
POTASSIUM: 3.7 mmol/L (ref 3.5–5.1)
SODIUM: 134 mmol/L — AB (ref 135–145)

## 2016-03-05 MED ORDER — POTASSIUM CHLORIDE CRYS ER 20 MEQ PO TBCR: 20.0000 meq | EXTENDED_RELEASE_TABLET | Freq: Two times a day (BID) | ORAL | 0 refills | Status: DC

## 2016-03-05 MED ORDER — ASPIRIN EC 81 MG PO TBEC: 81.0000 mg | DELAYED_RELEASE_TABLET | Freq: Every day | ORAL | Status: DC

## 2016-03-05 MED ORDER — SPIRONOLACTONE 50 MG PO TABS: 50.0000 mg | ORAL_TABLET | Freq: Two times a day (BID) | ORAL | 0 refills | Status: DC

## 2016-03-05 MED ORDER — FUROSEMIDE 40 MG PO TABS: 40.0000 mg | ORAL_TABLET | Freq: Two times a day (BID) | ORAL | 0 refills | Status: DC

## 2016-03-05 NOTE — Discharge Summary (Signed)
[place] [facility] [place] NAME: Jordan Hayden    MR#:  [id number]  DATE OF BIRTH:  Oct 14, 1965  DATE OF ADMISSION:  02/29/2016 ADMITTING PHYSICIAN: Dustin Flock, MD  DATE OF DISCHARGE: 03/05/16 PRIMARY CARE PHYSICIAN: Lazlo Body, MD    ADMISSION DIAGNOSIS:  Abdominal distention [R14.0] Anasarca [R60.1]  DISCHARGE DIAGNOSIS:  Active Problems:   Anasarca   SECONDARY DIAGNOSIS:   Past Medical History:  Diagnosis Date  . Alcohol abuse   . Cirrhosis [facility])     HOSPITAL COURSE:   HISTORY OF PRESENT ILLNESS: Jordan Hayden  is a 51 y.o. male with a known history of  Alcohol abuse and cirrhosis who actually had his first paracentesis yesterday. He went back for follow-up with Dr. Lytle Butte who evaluated the patient and noticed that he had significant amount of lower extremity swelling he has a significant amount of scrotal swelling and referred him to the emergency room for further evaluation. Patient does have anasarca from his waist below. Also has abdominal distention. He denies any fevers chills no abdominal pain nausea vomiting or diarrhea. Patient used to drink he stopped drinking 2 months ago. Please review history and physical for details  Hospital course   1. Severe anasarca secondary to liver cirrhosis and severe hypoalbuminemia. The patient has received IV  albumin . Case discussed with gastroenterology . Recommending  Lasix to 40 mg orally twice a day and increase spironolactone to 50 mg twice a day. Patient needs repeat ultrasound of the abdomen in 6 months. Outpatient follow-up with the gastroenterology Dr. Anastasio Champion for EGD. Anasarca significantly improved  2. 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 Patient has ascites. Patient had paracentesis today ultrasound-guided and had 4.3 L of fluid extracted, tolerated the procedure well, feeling better. Provide low-sodium diet   3. 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 Hypokalemia replace potassium orally . PCP to consider repeating BMP in a week during follow-up visit   DISCHARGE CONDITIONS:   Stable  CONSULTS OBTAINED:  Treatment Team:  Lollie Sails, MD Lin Landsman, MD   PROCEDURES Paracentesis on 03/05/2016  DRUG ALLERGIES:  No Known Allergies  DISCHARGE MEDICATIONS:   Current Discharge Medication List    START taking these medications   Details  aspirin EC 81 MG tablet Take 1 tablet (81 mg total) by mouth daily.      CONTINUE these medications which have CHANGED   Details  furosemide (LASIX) 40 MG tablet Take 1 tablet (40 mg total) by mouth 2 (two) times daily. Qty: 60 tablet, Refills: 0    potassium chloride SA (K-DUR,KLOR-CON) 20 MEQ tablet Take 1 tablet (20 mEq total) by mouth 2 (two) times daily. Qty: 60 tablet, Refills: 0    spironolactone (ALDACTONE) 50 MG tablet Take 1 tablet (50 mg total) by mouth 2 (two) times daily. Qty: 120 tablet, Refills: 0      CONTINUE these medications which have NOT CHANGED   Details  naproxen sodium (ANAPROX) 220 MG tablet Take 220 mg by mouth as needed.      STOP taking these medications     aspirin 500 MG tablet          DISCHARGE INSTRUCTIONS:  Follow-up with primary care physician in a week, PCP to repeat BMP in a week Follow-up with gastroenterology Dr. Donnella Sham in 2 weeks for outpatient EGD Restart taking spironolactone tonight and Lasix from tomorrow 03/06/2016 Stop drinking alcohol, outpatient follow-up with AA  DIET:  Cardiac diet  DISCHARGE CONDITION:  Fair  ACTIVITY:  Activity as tolerated  OXYGEN:  Home Oxygen: No.   Oxygen Delivery: room air  DISCHARGE LOCATION:  home   If you experience worsening of your admission symptoms, develop shortness of breath, life threatening emergency, suicidal or homicidal thoughts you must seek medical attention immediately by calling 911 or calling your MD  immediately  if symptoms less severe.  You Must read complete instructions/literature along with all the possible adverse reactions/side effects for all the Medicines you take and that have been prescribed to you. Take any new Medicines after you have completely understood and accpet all the possible adverse reactions/side effects.   Please note  You were cared for by a hospitalist during your hospital stay. If you have any questions about your discharge medications or the care you received while you were in the hospital after you are discharged, you can call the unit and asked to speak with the hospitalist on call if the hospitalist that took care of you is not available. Once you are discharged, your primary care physician will handle any further medical issues. Please note that NO REFILLS for any discharge medications will be authorized once you are discharged, as it is imperative that you return to your primary care physician (or establish a relationship with a primary care physician if you do not have one) for your aftercare needs so that they can reassess your need for medications and monitor your lab values.     Today  Chief Complaint  Patient presents with  . Abdominal Pain   Patient is feeling fine. Had ultrasound guided paracentesis today tolerated the procedure well lower extremity swelling significantly improved and wants to go home. GI is recommending outpatient EGD after discharge and repeat ultrasound in 6 months  ROS:  CONSTITUTIONAL: Denies fevers, chills. Denies any fatigue, weakness.  EYES: Denies blurry vision, double vision, eye pain. EARS, NOSE, THROAT: Denies tinnitus, ear pain, hearing loss. RESPIRATORY: Denies cough, wheeze, shortness of breath.  CARDIOVASCULAR: Denies chest pain, palpitations, edema.  GASTROINTESTINAL: Denies nausea, vomiting, diarrhea, abdominal pain. Denies bright red blood per rectum. GENITOURINARY: Denies dysuria, hematuria. ENDOCRINE: Denies  nocturia or thyroid problems. HEMATOLOGIC AND LYMPHATIC: Denies easy bruising or bleeding. SKIN: Denies rash or lesion. MUSCULOSKELETAL: Denies pain in neck, back, shoulder, knees, hips or arthritic symptoms.  NEUROLOGIC: Denies paralysis, paresthesias.  PSYCHIATRIC: Denies anxiety or depressive symptoms.   VITAL SIGNS:  Blood pressure 113/62, pulse (!) 103, temperature 99.2 F (37.3 C), temperature source Oral, resp. rate 20, height 5\' 8"  (1.727 m), weight 91 kg (200 lb 11.2 oz), SpO2 98 %.  I/O:    Intake/Output Summary (Last 24 hours) at 03/05/16 1105 Last data filed at 03/05/16 0835  Gross per 24 hour  Intake              483 ml  Output              900 ml  Net             -417 ml    PHYSICAL EXAMINATION:  GENERAL:  51 y.o.-year-old patient lying in the bed with no acute distress.  EYES: Pupils equal, round, reactive to light and accommodation. No scleral icterus. Extraocular muscles intact.  HEENT: Head atraumatic, normocephalic. Oropharynx and nasopharynx clear.  NECK:  Supple, no jugular venous distention. No thyroid enlargement, no tenderness.  LUNGS: Normal breath sounds bilaterally, no wheezing, rales,rhonchi or crepitation. No use of accessory muscles  of respiration.  CARDIOVASCULAR: S1, S2 normal. No murmurs, rubs, or gallops.  ABDOMEN: Soft, non-tender, non-distended.Status post ultrasound-guided paracentesis Bowel sounds present. No organomegaly or mass.  EXTREMITIES: Trace pedal edema, no cyanosis, or clubbing.  NEUROLOGIC: Cranial nerves II through XII are intact. Muscle strength 5/5 in all extremities. Sensation intact. Gait not checked.  PSYCHIATRIC: The patient is alert and oriented x 3.  SKIN: No obvious rash, lesion, or ulcer.   DATA REVIEW:   CBC  Recent Labs Lab 03/01/16 0422  WBC 5.7  HGB 10.2*  HCT 28.0*  PLT 77*    Chemistries   Recent Labs Lab 02/29/16 1826  03/05/16 0425  NA 136  < > 134*  K 3.3*  < > 3.7  CL 103  < > 104  CO2 27   < > 26  GLUCOSE 106*  < > 98  BUN 7  < > 7  CREATININE 1.02  < > 0.92  CALCIUM 7.9*  < > 7.5*  AST 74*  --   --   ALT 22  --   --   ALKPHOS 184*  --   --   BILITOT 6.1*  --   --   < > = values in this interval not displayed.  Cardiac Enzymes No results for input(s): TROPONINI in the last 168 hours.  Microbiology Results  Results for orders placed or performed during the hospital encounter of 02/28/16  Aerobic/Anaerobic Culture (surgical/deep wound)     Status: None   Collection Time: 02/28/16 12:00 PM  Result Value Ref Range Status   Specimen Description PERITONEAL  Final   Special Requests NONE  Final   Gram Stain   Final    WBC PRESENT, PREDOMINANTLY MONONUCLEAR NO ORGANISMS SEEN CYTOSPIN    Culture   Final    No growth aerobically or anaerobically. Performed at [facility], [place] [address], [place], [place] [address]    Report Status 03/04/2016 FINAL  Final    RADIOLOGY:  Ct Chest W Contrast  Result Date: 03/02/2016 CLINICAL DATA:  Shortness of breath. Improvement following paracentesis. EXAM: CT CHEST WITH CONTRAST TECHNIQUE: Multidetector CT imaging of the chest was performed during intravenous contrast administration. CONTRAST:  88mL ISOVUE-300 IOPAMIDOL (ISOVUE-300) INJECTION 61% COMPARISON:  No prior chest imaging. FINDINGS: Cardiovascular: Extensive coronary artery calcification. No aortic calcification identified. No pericardial fluid. Mediastinum/Nodes: No mediastinal or hilar mass or lymphadenopathy. Lungs/Pleura: Elevated right hemidiaphragm with mild atelectasis at the right lung base. Nondependent lung is clear. Moderate size left effusion layering dependently with dependent pulmonary atelectasis. Near complete collapse of left lower lobe. No focal lesions seen in the aerated lung. Upper Abdomen: Advanced cirrhosis.  Large amount of ascites. Musculoskeletal: Ordinary degenerative changes affect the spine. IMPRESSION: Moderate left effusion layering dependently  with dependent pulmonary atelectasis. Elevated right hemidiaphragm with some atelectasis the right lung base. Extensive coronary artery calcification. Large amount of ascites.  Advanced cirrhosis. Electronically Signed   By: Nelson Chimes M.D.   On: 03/02/2016 11:28   US Paracentesis  Result Date: 03/01/2016 INDICATION: 51 year old male with recurrent ascites EXAM: ULTRASOUND GUIDED  PARACENTESIS MEDICATIONS: None. COMPLICATIONS: None immediate. PROCEDURE: Informed written consent was obtained from the patient after a discussion of the risks, benefits and alternatives to treatment. A timeout was performed prior to the initiation of the procedure. Initial ultrasound scanning demonstrates a large amount of ascites within the right lower abdominal quadrant. The right lower abdomen was prepped and draped in the usual sterile fashion. 1%  lidocaine with epinephrine was used for local anesthesia. Following this, a 6 Fr Safe-T-Centesis catheter was introduced. An ultrasound image was saved for documentation purposes. The paracentesis was performed. The catheter was removed and a dressing was applied. The patient tolerated the procedure well without immediate post procedural complication. FINDINGS: A total of approximately 4300 mL straw-colored ascitic fluid was removed. Samples were sent to the laboratory as requested by the clinical team. IMPRESSION: Successful ultrasound-guided paracentesis yielding 4.3 liters of peritoneal fluid. Electronically Signed   By: Jacqulynn Cadet M.D.   On: 03/01/2016 17:46    EKG:   Orders placed or performed during the hospital encounter of 02/29/16  . EKG 12-Lead  . EKG 12-Lead      Management plans discussed with the patient, family and they are in agreement.  CODE STATUS:     Code Status Orders        Start     Ordered   02/29/16 1725  Full code  Continuous     02/29/16 1726    Code Status History    Date Active Date Inactive Code Status Order ID Comments  User Context   This patient has a current code status but no historical code status.      TOTAL TIME TAKING CARE OF THIS PATIENT: 45  minutes.   Note: This dictation was prepared with Dragon dictation along with smaller phrase technology. Any transcriptional errors that result from this process are unintentional.   @[facility] @  on 03/05/2016 at 11:05 AM  Between 7am to 6pm - Pager - [phone]  After 6pm go to [email or web address] - password [email or web address] [facility]  Office  [phone]  CC: Primary care physician; Ettore Body, MD

## 2016-03-05 NOTE — Progress Notes (Signed)
Pt is alert and VS stable. Denies pain. Swelling has improved.

## 2016-03-05 NOTE — Discharge Instructions (Signed)
Follow-up with primary care physician in a week, PCP to repeat BMP in a week Follow-up with gastroenterology Dr. Donnella Sham in 2 weeks for outpatient EGD Restart taking spironolactone tonight and Lasix from tomorrow 03/06/2016

## 2016-04-03 ENCOUNTER — Ambulatory Visit: Payer: Self-pay | Admitting: Pharmacy Technician

## 2016-04-03 NOTE — Progress Notes (Signed)
Patient scheduled for eligibility appointment at [facility].  Patient did not show for the appointment on 04/03/16 at 2:00p.m.  Patient did not reschedule eligibility appointment.  [facility] will be unable to provide additional medication assistance until eligibility is determined.  [facility]

## 2016-06-27 ENCOUNTER — Other Ambulatory Visit: Payer: Self-pay | Admitting: Gastroenterology

## 2016-06-27 DIAGNOSIS — K7031 Alcoholic cirrhosis of liver with ascites: Secondary | ICD-10-CM

## 2016-07-17 ENCOUNTER — Ambulatory Visit
Admission: RE | Admit: 2016-07-17 | Discharge: 2016-07-17 | Disposition: A | Discharge status: Home / Self Care | Payer: Medicaid Other | Source: Ambulatory Visit | Attending: Gastroenterology | Admitting: Gastroenterology

## 2016-07-17 DIAGNOSIS — K7031 Alcoholic cirrhosis of liver with ascites: Secondary | ICD-10-CM | POA: Diagnosis not present

## 2016-11-16 ENCOUNTER — Encounter: Payer: Self-pay | Admitting: *Deleted

## 2016-11-19 ENCOUNTER — Encounter: Admission: RE | Payer: Self-pay | Source: Ambulatory Visit

## 2016-11-19 ENCOUNTER — Ambulatory Visit: Admission: RE | Admit: 2016-11-19 | Payer: Medicaid Other | Source: Ambulatory Visit | Admitting: Gastroenterology

## 2016-11-19 HISTORY — DX: Alcohol dependence, uncomplicated: F10.20

## 2016-11-19 HISTORY — DX: Alcoholic cirrhosis of liver without ascites: K70.30

## 2016-11-19 SURGERY — ESOPHAGOGASTRODUODENOSCOPY (EGD) WITH PROPOFOL
Anesthesia: General

## 2017-01-14 ENCOUNTER — Other Ambulatory Visit: Payer: Self-pay | Admitting: Gastroenterology

## 2017-01-14 DIAGNOSIS — K7030 Alcoholic cirrhosis of liver without ascites: Secondary | ICD-10-CM

## 2017-01-18 ENCOUNTER — Ambulatory Visit
Admission: RE | Admit: 2017-01-18 | Discharge: 2017-01-18 | Disposition: A | Discharge status: Home / Self Care | Payer: Medicaid Other | Source: Ambulatory Visit | Attending: Gastroenterology | Admitting: Gastroenterology

## 2017-01-18 DIAGNOSIS — K7030 Alcoholic cirrhosis of liver without ascites: Secondary | ICD-10-CM | POA: Diagnosis not present

## 2017-01-18 DIAGNOSIS — R932 Abnormal findings on diagnostic imaging of liver and biliary tract: Secondary | ICD-10-CM | POA: Insufficient documentation

## 2017-03-29 ENCOUNTER — Ambulatory Visit: Admission: RE | Admit: 2017-03-29 | Payer: Medicaid Other | Source: Ambulatory Visit | Admitting: Gastroenterology

## 2017-03-29 ENCOUNTER — Encounter: Admission: RE | Payer: Self-pay | Source: Ambulatory Visit

## 2017-03-29 SURGERY — COLONOSCOPY WITH PROPOFOL
Anesthesia: General

## 2017-04-01 DIAGNOSIS — D696 Thrombocytopenia, unspecified: Secondary | ICD-10-CM | POA: Insufficient documentation

## 2017-05-16 ENCOUNTER — Other Ambulatory Visit: Payer: Self-pay

## 2017-05-16 ENCOUNTER — Encounter: Payer: Self-pay | Admitting: Oncology

## 2017-05-16 ENCOUNTER — Inpatient Hospital Stay: Payer: Medicaid Other

## 2017-05-16 ENCOUNTER — Inpatient Hospital Stay: Payer: Medicaid Other | Attending: Oncology | Admitting: Oncology

## 2017-05-16 VITALS — BP 117/65 | HR 94 | Temp 98.3°F | Wt 244.0 lb

## 2017-05-16 DIAGNOSIS — D509 Iron deficiency anemia, unspecified: Secondary | ICD-10-CM | POA: Diagnosis not present

## 2017-05-16 DIAGNOSIS — Z808 Family history of malignant neoplasm of other organs or systems: Secondary | ICD-10-CM | POA: Diagnosis not present

## 2017-05-16 DIAGNOSIS — Z79899 Other long term (current) drug therapy: Secondary | ICD-10-CM | POA: Insufficient documentation

## 2017-05-16 DIAGNOSIS — R748 Abnormal levels of other serum enzymes: Secondary | ICD-10-CM | POA: Insufficient documentation

## 2017-05-16 DIAGNOSIS — D689 Coagulation defect, unspecified: Secondary | ICD-10-CM | POA: Diagnosis not present

## 2017-05-16 DIAGNOSIS — D696 Thrombocytopenia, unspecified: Secondary | ICD-10-CM | POA: Diagnosis not present

## 2017-05-16 DIAGNOSIS — R5382 Chronic fatigue, unspecified: Secondary | ICD-10-CM | POA: Diagnosis not present

## 2017-05-16 DIAGNOSIS — D649 Anemia, unspecified: Secondary | ICD-10-CM

## 2017-05-16 DIAGNOSIS — K7030 Alcoholic cirrhosis of liver without ascites: Secondary | ICD-10-CM | POA: Diagnosis not present

## 2017-05-16 DIAGNOSIS — E559 Vitamin D deficiency, unspecified: Secondary | ICD-10-CM | POA: Insufficient documentation

## 2017-05-16 LAB — CBC WITH DIFFERENTIAL/PLATELET
BASOS PCT: 1 %
Basophils Absolute: 0 10*3/uL (ref 0–0.1)
EOS PCT: 5 %
Eosinophils Absolute: 0.2 10*3/uL (ref 0–0.7)
HCT: 28.1 % — ABNORMAL LOW (ref 40.0–52.0)
Hemoglobin: 9.3 g/dL — ABNORMAL LOW (ref 13.0–18.0)
Lymphocytes Relative: 34 %
Lymphs Abs: 1.5 10*3/uL (ref 1.0–3.6)
MCH: 30.9 pg (ref 26.0–34.0)
MCHC: 33.3 g/dL (ref 32.0–36.0)
MCV: 92.8 fL (ref 80.0–100.0)
MONO ABS: 0.4 10*3/uL (ref 0.2–1.0)
MONOS PCT: 9 %
NEUTROS ABS: 2.2 10*3/uL (ref 1.4–6.5)
Neutrophils Relative %: 51 %
PLATELETS: 77 10*3/uL — AB (ref 150–440)
RBC: 3.02 MIL/uL — ABNORMAL LOW (ref 4.40–5.90)
RDW: 15.7 % — AB (ref 11.5–14.5)
WBC: 4.3 10*3/uL (ref 3.8–10.6)

## 2017-05-16 LAB — FOLATE: Folate: 11.8 ng/mL (ref >5.9)

## 2017-05-16 LAB — IRON AND TIBC
IRON: 16 ug/dL — AB (ref 45–182)
SATURATION RATIOS: 5 % — AB (ref 17.9–39.5)
TIBC: 304 ug/dL (ref 250–450)
UIBC: 288 ug/dL

## 2017-05-16 LAB — FERRITIN: FERRITIN: 11 ng/mL — AB (ref 24–336)

## 2017-05-16 LAB — VITAMIN B12: Vitamin B-12: 1004 pg/mL — ABNORMAL HIGH (ref 180–914)

## 2017-05-16 LAB — TECHNOLOGIST SMEAR REVIEW

## 2017-05-16 LAB — TSH: TSH: 2.145 u[IU]/mL (ref 0.350–4.500)

## 2017-05-16 NOTE — Progress Notes (Signed)
Hematology/Oncology Consult note [facility] [phone] [phone]   Patient Care Team: Hendry Body, MD as PCP - General (Family Medicine)  REFERRING PROVIDER: Dr.Skulskie CHIEF COMPLAINTS/PURPOSE OF CONSULTATION:  Evaluation of elevated alkaline phosphatase.   HISTORY OF PRESENTING ILLNESS:  Jordan Hayden is a  52 y.o.  male with PMH listed below who was referred to me for evaluation of alkaline phosphatase. Patient has a history of alcoholic cirrhosis  And follows up with gastroenterologist Dr. Gustavo Lah.  Her recent labs showed anemia, which he plan to have EGD colonoscopy for evaluation.  This was dictated due to thrombocytopenia.  Patient's function test showed significantly elevated alkaline phosphatase with increased bone fraction.  Subsequently patient had PSA, serum protein electrophoresis done for work-up suspecting possible bone metastasis.  PSA was normal SPEP did not show any monoclonal protein.  Patient was referred to [place] for further evaluation.  Patient reports chronic fatigue. Denies hematochezia, hematuria, hematemesis, epistaxis, black tarry stool or easy bruising.   Review of Systems  Constitutional: Positive for malaise/fatigue. Negative for chills, fever and weight loss.  HENT: Negative for congestion, ear discharge, ear pain, nosebleeds, sinus pain and sore throat.   Eyes: Negative for double vision, photophobia, pain, discharge and redness.  Respiratory: Negative for cough, hemoptysis, sputum production, shortness of breath and wheezing.   Cardiovascular: Negative for chest pain, palpitations, orthopnea, claudication and leg swelling.  Gastrointestinal: Negative for abdominal pain, blood in stool, constipation, diarrhea, heartburn, melena, nausea and vomiting.  Genitourinary: Negative for dysuria, flank pain, frequency and hematuria.  Musculoskeletal: Negative for back pain, myalgias and neck pain.  Skin: Negative  for itching and rash.  Neurological: Negative for dizziness, tingling, tremors, focal weakness, weakness and headaches.  Endo/Heme/Allergies: Negative for environmental allergies. Does not bruise/bleed easily.  Psychiatric/Behavioral: Negative for depression and hallucinations. The patient is not nervous/anxious.     MEDICAL HISTORY:  Past Medical History:  Diagnosis Date  . Alcohol abuse   . Alcoholic cirrhosis ([place])   . Alcoholism ([place])   . Cirrhosis ([place])     SURGICAL HISTORY: History reviewed. No pertinent surgical history.  SOCIAL HISTORY: Social History   Socioeconomic History  . Marital status: Single    Spouse name: Not on file  . Number of children: 1  . Years of education: Not on file  . Highest education level: Not on file  Occupational History  . Not on file  Social Needs  . Financial resource strain: Not on file  . Food insecurity:    Worry: Not on file    Inability: Not on file  . Transportation needs:    Medical: Not on file    Non-medical: Not on file  Tobacco Use  . Smoking status: Never Smoker  . Smokeless tobacco: Never Used  Substance and Sexual Activity  . Alcohol use: No    Comment: quit 2 years   . Drug use: No  . Sexual activity: Not on file  Lifestyle  . Physical activity:    Days per week: Not on file    Minutes per session: Not on file  . Stress: Not on file  Relationships  . Social connections:    Talks on phone: Not on file    Gets together: Not on file    Attends religious service: Not on file    Active member of club or organization: Not on file    Attends meetings of clubs or organizations: Not on file    Relationship status: Not on  file  . Intimate partner violence:    Fear of current or ex partner: Not on file    Emotionally abused: Not on file    Physically abused: Not on file    Forced sexual activity: Not on file  Other Topics Concern  . Not on file  Social History Narrative  . Not on file    FAMILY  HISTORY: Family History  Problem Relation Age of Onset  . Hypertension Mother   . Diabetes Father   . Cervical cancer Sister   . Cirrhosis Brother     ALLERGIES:  is allergic to hydrocodone-acetaminophen.  MEDICATIONS:  Current Outpatient Medications  Medication Sig Dispense Refill  . aspirin EC 81 MG tablet Take 1 tablet (81 mg total) by mouth daily.    . furosemide (LASIX) 40 MG tablet Take 1 tablet (40 mg total) by mouth 2 (two) times daily. 60 tablet 0  . hydrOXYzine (ATARAX/VISTARIL) 25 MG tablet Take 25 mg by mouth 3 (three) times daily as needed.    . naproxen sodium (ANAPROX) 220 MG tablet Take 220 mg by mouth as needed.    Marland Kitchen spironolactone (ALDACTONE) 50 MG tablet Take 1 tablet (50 mg total) by mouth 2 (two) times daily. 120 tablet 0   No current facility-administered medications for this visit.      PHYSICAL EXAMINATION: ECOG PERFORMANCE STATUS: 1 - Symptomatic but completely ambulatory Vitals:   05/16/17 1547  BP: 117/65  Pulse: 94  Temp: 98.3 F (36.8 C)   Filed Weights   05/16/17 1547  Weight: 244 lb (110.7 kg)    Physical Exam  Constitutional: He is oriented to person, place, and time. He appears well-developed and well-nourished. No distress.  HENT:  Head: Normocephalic and atraumatic.  Right Ear: External ear normal.  Left Ear: External ear normal.  Mouth/Throat: Oropharynx is clear and moist.  Eyes: Pupils are equal, round, and reactive to light. EOM are normal. No scleral icterus.  Neck: Normal range of motion. Neck supple.  Cardiovascular: Normal rate, regular rhythm and normal heart sounds.  Pulmonary/Chest: Effort normal and breath sounds normal. No respiratory distress. He has no wheezes. He has no rales. He exhibits no tenderness.  Abdominal: Soft. Bowel sounds are normal. He exhibits no distension and no mass. There is no tenderness.  Musculoskeletal: Normal range of motion. He exhibits no edema or deformity.  Lymphadenopathy:    He has no  cervical adenopathy.  Neurological: He is alert and oriented to person, place, and time. No cranial nerve deficit. Coordination normal.  Skin: Skin is warm and dry. No rash noted.  Psychiatric: He has a normal mood and affect. His behavior is normal. Thought content normal.     LABORATORY DATA:  I have reviewed the data as listed Lab Results  Component Value Date   WBC 4.3 05/16/2017   HGB 9.3 (L) 05/16/2017   HCT 28.1 (L) 05/16/2017   MCV 92.8 05/16/2017   PLT 77 (L) 05/16/2017   No results for input(s): NA, K, CL, CO2, GLUCOSE, BUN, CREATININE, CALCIUM, GFRNONAA, GFRAA, PROT, ALBUMIN, AST, ALT, ALKPHOS, BILITOT, BILIDIR, IBILI in the last 8760 hours.     ASSESSMENT & PLAN:  1. Alkaline phosphatase elevation   2. Iron deficiency anemia, unspecified iron deficiency anemia type   3. Thrombocytopenia ([place])   4. Alcoholic cirrhosis, unspecified whether ascites present ([place])   5. Vitamin D deficiency    # Previous labs reviewed with patient. He has significantly elevated Alkaline phosphatase, bone fraction. PSA  normal and no monoclinal protein spike on SPEP. I suspect patient may have severe vitamin D deficiency, which can cause similar picture. Will obtain vitamin D level  # Anemia: possible causes including chronic blood loss, hyper/hypothyroidism, nutritional deficiency, infection/chronic inflammation, hemolysis, underlying bone marrow disorders.Will check CBC w differential, vitamin B12, Folate, iron/TIBC, ferritin,, TSH.   # Labs reviewed, consistent with severe vitamin D deficiency, will start patient on vitamin D supplement 50,000 units weekly x 8. Rx sent to Pharmacy.  Also severe iron deficiency anemia, plan IV iron therapy. Will discuss with patient.  Suspect chronic GI blood loss. Currently patient's GI work up is held due to thrombocytopenia and coagulopathy.   All questions were answered. The patient knows to call the clinic with any problems questions or  concerns.  Return of visit: 1-2 weeks. Thank you for this kind referral and the opportunity to participate in the care of this patient. A copy of today's note is routed to referring provider    Earlie Server, MD, PhD Hematology Oncology [facility] at [facility] Pager- [phone] 05/16/2017

## 2017-05-16 NOTE — Progress Notes (Signed)
Patient here today as a new patient  

## 2017-05-17 LAB — KAPPA/LAMBDA LIGHT CHAINS
KAPPA, LAMDA LIGHT CHAIN RATIO: 1.85 — AB (ref 0.26–1.65)
Kappa free light chain: 65.9 mg/L — ABNORMAL HIGH (ref 3.3–19.4)
Lambda free light chains: 35.7 mg/L — ABNORMAL HIGH (ref 5.7–26.3)

## 2017-05-17 LAB — VITAMIN D 25 HYDROXY (VIT D DEFICIENCY, FRACTURES)

## 2017-05-19 DIAGNOSIS — D509 Iron deficiency anemia, unspecified: Secondary | ICD-10-CM | POA: Insufficient documentation

## 2017-05-19 MED ORDER — ERGOCALCIFEROL 1.25 MG (50000 UT) PO CAPS: 50000.0000 [IU] | ORAL_CAPSULE | ORAL | 0 refills | Status: DC

## 2017-05-21 ENCOUNTER — Inpatient Hospital Stay: Payer: Medicaid Other

## 2017-05-21 ENCOUNTER — Inpatient Hospital Stay: Payer: Medicaid Other | Admitting: Oncology

## 2017-05-21 ENCOUNTER — Other Ambulatory Visit: Payer: Self-pay | Admitting: Gastroenterology

## 2017-05-21 DIAGNOSIS — K7460 Unspecified cirrhosis of liver: Secondary | ICD-10-CM

## 2017-05-22 ENCOUNTER — Telehealth: Payer: Self-pay | Admitting: Oncology

## 2017-05-22 NOTE — Telephone Encounter (Signed)
Patient No Show 05/21/17 MD/Infusion appts. Called patient to rschd appts, was unsuccessful. L/M and phone number on V/M for patient to call and rschd appts.

## 2017-05-23 NOTE — Discharge Instructions (Signed)
Paracentesis, Care After °Refer to this sheet in the next few weeks. These instructions provide you with information about caring for yourself after your procedure. Your health care provider may also give you more specific instructions. Your treatment has been planned according to current medical practices, but problems sometimes occur. Call your health care provider if you have any problems or questions after your procedure. °What can I expect after the procedure? °After your procedure, it is common to have a small amount of clear fluid coming from the puncture site. °Follow these instructions at home: °· Return to your normal activities as told by your health care provider. Ask your health care provider what activities are safe for you. °· Take over-the-counter and prescription medicines only as told by your health care provider. °· Do not take baths, swim, or use a hot tub until your health care provider approves. °· Follow instructions from your health care provider about: °? How to take care of your puncture site. °? When and how you should change your bandage (dressing). °? When you should remove your dressing. °· Check your puncture area every day signs of infection. Watch for: °? Redness, swelling, or pain. °? Fluid, blood, or pus. °· Keep all follow-up visits as told by your health care provider. This is important. °Contact a health care provider if: °· You have redness, swelling, or pain at your puncture site. °· You start to have more clear fluid coming from your puncture site. °· You have blood or pus coming from your puncture site. °· You have chills. °· You have a fever. °Get help right away if: °· You develop chest pain or shortness of breath. °· You develop increasing pain, discomfort, or swelling in your abdomen. °· You feel dizzy or light-headed or you pass out. °This information is not intended to replace advice given to you by your health care provider. Make sure you discuss any questions you  have with your health care provider. °Document Released: 05/11/2014 Document Revised: 06/02/2015 Document Reviewed: 03/09/2014 °Elsevier Interactive Patient Education © 2018 [employer] ° °

## 2017-05-28 ENCOUNTER — Ambulatory Visit
Admission: RE | Admit: 2017-05-28 | Discharge: 2017-05-28 | Disposition: A | Discharge status: Home / Self Care | Payer: Medicaid Other | Source: Ambulatory Visit | Attending: Gastroenterology | Admitting: Gastroenterology

## 2017-05-28 ENCOUNTER — Other Ambulatory Visit: Payer: Self-pay | Admitting: Gastroenterology

## 2017-05-28 ENCOUNTER — Ambulatory Visit: Payer: Medicaid Other

## 2017-05-28 DIAGNOSIS — K7469 Other cirrhosis of liver: Secondary | ICD-10-CM | POA: Diagnosis present

## 2017-05-28 DIAGNOSIS — K7460 Unspecified cirrhosis of liver: Secondary | ICD-10-CM

## 2017-05-28 DIAGNOSIS — R188 Other ascites: Secondary | ICD-10-CM | POA: Insufficient documentation

## 2017-05-30 ENCOUNTER — Ambulatory Visit: Payer: Medicaid Other | Admitting: Oncology

## 2017-06-26 ENCOUNTER — Other Ambulatory Visit: Payer: Self-pay | Admitting: Gastroenterology

## 2017-06-26 DIAGNOSIS — K7030 Alcoholic cirrhosis of liver without ascites: Secondary | ICD-10-CM

## 2017-06-28 ENCOUNTER — Other Ambulatory Visit: Payer: Self-pay

## 2017-06-28 ENCOUNTER — Inpatient Hospital Stay
Admission: EM | Admit: 2017-06-28 | Discharge: 2017-07-02 | DRG: 871 | Disposition: A | Discharge status: Home / Self Care | Payer: Medicaid Other | Attending: Family Medicine | Admitting: Family Medicine

## 2017-06-28 ENCOUNTER — Encounter: Payer: Self-pay | Admitting: Emergency Medicine

## 2017-06-28 ENCOUNTER — Emergency Department: Payer: Medicaid Other

## 2017-06-28 DIAGNOSIS — E872 Acidosis: Secondary | ICD-10-CM | POA: Diagnosis present

## 2017-06-28 DIAGNOSIS — T500X5A Adverse effect of mineralocorticoids and their antagonists, initial encounter: Secondary | ICD-10-CM | POA: Diagnosis present

## 2017-06-28 DIAGNOSIS — R509 Fever, unspecified: Secondary | ICD-10-CM

## 2017-06-28 DIAGNOSIS — E861 Hypovolemia: Secondary | ICD-10-CM | POA: Diagnosis present

## 2017-06-28 DIAGNOSIS — K449 Diaphragmatic hernia without obstruction or gangrene: Secondary | ICD-10-CM | POA: Diagnosis present

## 2017-06-28 DIAGNOSIS — E8770 Fluid overload, unspecified: Secondary | ICD-10-CM | POA: Diagnosis not present

## 2017-06-28 DIAGNOSIS — D684 Acquired coagulation factor deficiency: Secondary | ICD-10-CM | POA: Diagnosis present

## 2017-06-28 DIAGNOSIS — K7031 Alcoholic cirrhosis of liver with ascites: Secondary | ICD-10-CM | POA: Diagnosis present

## 2017-06-28 DIAGNOSIS — D6959 Other secondary thrombocytopenia: Secondary | ICD-10-CM | POA: Diagnosis present

## 2017-06-28 DIAGNOSIS — I10 Essential (primary) hypertension: Secondary | ICD-10-CM | POA: Diagnosis present

## 2017-06-28 DIAGNOSIS — N179 Acute kidney failure, unspecified: Secondary | ICD-10-CM | POA: Diagnosis present

## 2017-06-28 DIAGNOSIS — Z7682 Awaiting organ transplant status: Secondary | ICD-10-CM | POA: Diagnosis not present

## 2017-06-28 DIAGNOSIS — I8500 Esophageal varices without bleeding: Secondary | ICD-10-CM | POA: Diagnosis present

## 2017-06-28 DIAGNOSIS — A401 Sepsis due to streptococcus, group B: Secondary | ICD-10-CM | POA: Diagnosis present

## 2017-06-28 DIAGNOSIS — Z79899 Other long term (current) drug therapy: Secondary | ICD-10-CM

## 2017-06-28 DIAGNOSIS — K2970 Gastritis, unspecified, without bleeding: Secondary | ICD-10-CM | POA: Diagnosis present

## 2017-06-28 DIAGNOSIS — K767 Hepatorenal syndrome: Secondary | ICD-10-CM | POA: Diagnosis present

## 2017-06-28 DIAGNOSIS — Z885 Allergy status to narcotic agent status: Secondary | ICD-10-CM

## 2017-06-28 DIAGNOSIS — D500 Iron deficiency anemia secondary to blood loss (chronic): Secondary | ICD-10-CM | POA: Diagnosis present

## 2017-06-28 DIAGNOSIS — Z7982 Long term (current) use of aspirin: Secondary | ICD-10-CM

## 2017-06-28 DIAGNOSIS — I5031 Acute diastolic (congestive) heart failure: Secondary | ICD-10-CM | POA: Diagnosis not present

## 2017-06-28 DIAGNOSIS — D649 Anemia, unspecified: Secondary | ICD-10-CM | POA: Diagnosis not present

## 2017-06-28 DIAGNOSIS — A419 Sepsis, unspecified organism: Secondary | ICD-10-CM | POA: Diagnosis present

## 2017-06-28 LAB — URINALYSIS, COMPLETE (UACMP) WITH MICROSCOPIC
Bilirubin Urine: NEGATIVE
Glucose, UA: NEGATIVE mg/dL
Hgb urine dipstick: NEGATIVE
Ketones, ur: NEGATIVE mg/dL
LEUKOCYTES UA: NEGATIVE
Nitrite: NEGATIVE
PROTEIN: 30 mg/dL — AB
SPECIFIC GRAVITY, URINE: 1.02 (ref 1.005–1.030)
pH: 5 (ref 5.0–8.0)

## 2017-06-28 LAB — COMPREHENSIVE METABOLIC PANEL
ALT: 18 U/L (ref 17–63)
AST: 47 U/L — AB (ref 15–41)
Albumin: 2.2 g/dL — ABNORMAL LOW (ref 3.5–5.0)
Alkaline Phosphatase: 384 U/L — ABNORMAL HIGH (ref 38–126)
Anion gap: 9 (ref 5–15)
BUN: 13 mg/dL (ref 6–20)
CHLORIDE: 110 mmol/L (ref 101–111)
CO2: 16 mmol/L — AB (ref 22–32)
CREATININE: 1.17 mg/dL (ref 0.61–1.24)
Calcium: 7.2 mg/dL — ABNORMAL LOW (ref 8.9–10.3)
GFR calc non Af Amer: >60 mL/min (ref >60)
Glucose, Bld: 90 mg/dL (ref 65–99)
POTASSIUM: 3.4 mmol/L — AB (ref 3.5–5.1)
SODIUM: 135 mmol/L (ref 135–145)
Total Bilirubin: 2.1 mg/dL — ABNORMAL HIGH (ref 0.3–1.2)
Total Protein: 5.9 g/dL — ABNORMAL LOW (ref 6.5–8.1)

## 2017-06-28 LAB — CBC WITH DIFFERENTIAL/PLATELET
BASOS ABS: 0 10*3/uL (ref 0–0.1)
BASOS PCT: 0 %
Eosinophils Absolute: 0 10*3/uL (ref 0–0.7)
Eosinophils Relative: 0 %
HEMATOCRIT: 24 % — AB (ref 40.0–52.0)
HEMOGLOBIN: 8 g/dL — AB (ref 13.0–18.0)
LYMPHS PCT: 7 %
Lymphs Abs: 0.5 10*3/uL — ABNORMAL LOW (ref 1.0–3.6)
MCH: 27.8 pg (ref 26.0–34.0)
MCHC: 33.5 g/dL (ref 32.0–36.0)
MCV: 83.1 fL (ref 80.0–100.0)
MONO ABS: 0.1 10*3/uL — AB (ref 0.2–1.0)
Monocytes Relative: 1 %
NEUTROS ABS: 6.3 10*3/uL (ref 1.4–6.5)
NEUTROS PCT: 92 %
Platelets: 65 10*3/uL — ABNORMAL LOW (ref 150–440)
RBC: 2.89 MIL/uL — ABNORMAL LOW (ref 4.40–5.90)
RDW: 17 % — ABNORMAL HIGH (ref 11.5–14.5)
WBC: 7 10*3/uL (ref 3.8–10.6)

## 2017-06-28 LAB — PROTIME-INR
INR: 2.25
Prothrombin Time: 24.7 seconds — ABNORMAL HIGH (ref 11.4–15.2)

## 2017-06-28 LAB — LACTIC ACID, PLASMA
LACTIC ACID, VENOUS: 4.7 mmol/L — AB (ref 0.5–1.9)
Lactic Acid, Venous: 3.9 mmol/L (ref 0.5–1.9)

## 2017-06-28 MED ORDER — SODIUM CHLORIDE 0.9 % IV SOLN
2.0000 g | Freq: Three times a day (TID) | INTRAVENOUS | Status: DC
  Administered 2017-06-28 – 2017-06-30 (×5): 2 g via INTRAVENOUS
  Filled 2017-06-28 (×7): qty 2

## 2017-06-28 MED ORDER — ACETAMINOPHEN 325 MG PO TABS: 650.0000 mg | ORAL_TABLET | Freq: Four times a day (QID) | ORAL | Status: DC | PRN

## 2017-06-28 MED ORDER — ONDANSETRON HCL 4 MG PO TABS: 4.0000 mg | ORAL_TABLET | Freq: Four times a day (QID) | ORAL | Status: DC | PRN

## 2017-06-28 MED ORDER — POLYETHYLENE GLYCOL 3350 17 G PO PACK: 17.0000 g | PACK | Freq: Every day | ORAL | Status: DC | PRN

## 2017-06-28 MED ORDER — SODIUM CHLORIDE 0.9 % IV BOLUS (SEPSIS)
200.0000 mL | Freq: Once | INTRAVENOUS | Status: AC
  Administered 2017-06-28: 200 mL via INTRAVENOUS

## 2017-06-28 MED ORDER — ACETAMINOPHEN 650 MG RE SUPP: 650.0000 mg | Freq: Four times a day (QID) | RECTAL | Status: DC | PRN

## 2017-06-28 MED ORDER — SODIUM CHLORIDE 0.9 % IV BOLUS
500.0000 mL | Freq: Once | INTRAVENOUS | Status: AC
  Administered 2017-06-28: 500 mL via INTRAVENOUS

## 2017-06-28 MED ORDER — BISACODYL 10 MG RE SUPP: 10.0000 mg | Freq: Every day | RECTAL | Status: DC | PRN

## 2017-06-28 MED ORDER — SODIUM CHLORIDE 0.9 % IV BOLUS (SEPSIS)
1000.0000 mL | Freq: Once | INTRAVENOUS | Status: AC
  Administered 2017-06-28: 1000 mL via INTRAVENOUS

## 2017-06-28 MED ORDER — ONDANSETRON HCL 4 MG/2ML IJ SOLN: 4.0000 mg | Freq: Four times a day (QID) | INTRAMUSCULAR | Status: DC | PRN

## 2017-06-28 MED ORDER — PIPERACILLIN-TAZOBACTAM 3.375 G IVPB 30 MIN
3.3750 g | Freq: Once | INTRAVENOUS | Status: AC
  Administered 2017-06-28: 3.375 g via INTRAVENOUS
  Filled 2017-06-28: qty 50

## 2017-06-28 MED ORDER — HYDROXYZINE HCL 25 MG PO TABS: 25.0000 mg | ORAL_TABLET | Freq: Three times a day (TID) | ORAL | Status: DC | PRN

## 2017-06-28 MED ORDER — VANCOMYCIN HCL IN DEXTROSE 1-5 GM/200ML-% IV SOLN
1000.0000 mg | Freq: Once | INTRAVENOUS | Status: AC
  Administered 2017-06-28: 1000 mg via INTRAVENOUS
  Filled 2017-06-28: qty 200

## 2017-06-28 MED ORDER — ALBUTEROL SULFATE (2.5 MG/3ML) 0.083% IN NEBU
2.5000 mg | INHALATION_SOLUTION | RESPIRATORY_TRACT | Status: DC | PRN
  Administered 2017-06-29: 2.5 mg via RESPIRATORY_TRACT
  Filled 2017-06-28: qty 3

## 2017-06-28 MED ORDER — POTASSIUM CHLORIDE IN NACL 20-0.9 MEQ/L-% IV SOLN
INTRAVENOUS | Status: AC
  Administered 2017-06-29: via INTRAVENOUS
  Filled 2017-06-28 (×3): qty 1000

## 2017-06-28 MED ORDER — VANCOMYCIN HCL IN DEXTROSE 1-5 GM/200ML-% IV SOLN
1000.0000 mg | Freq: Two times a day (BID) | INTRAVENOUS | Status: DC
  Administered 2017-06-29 (×3): 1000 mg via INTRAVENOUS
  Filled 2017-06-28 (×5): qty 200

## 2017-06-28 MED ORDER — IBUPROFEN 600 MG PO TABS
600.0000 mg | ORAL_TABLET | Freq: Once | ORAL | Status: AC
  Administered 2017-06-28: 600 mg via ORAL
  Filled 2017-06-28: qty 1

## 2017-06-28 NOTE — Consult Note (Signed)
Pharmacy Antibiotic Note  Jordan Hayden is a 52 y.o. male admitted on 06/28/2017 with sepsis.  Pharmacy has been consulted for vancomycin and cefepime dosing.  Plan: Vancomycin 1 g given in the ED. Will give next dose in 6 hours for stacked dosing Vancomycin 1000mg  IV every 12 hours.  Goal trough 15-20 mcg/mL.  Trough prior to the 5th dose Cefepime 2g q 8 hours  Height: 5\' 8"  (172.7 cm) Weight: 262 lb (118.8 kg) IBW/kg (Calculated) : 68.4  Temp (24hrs), Avg:102.5 F (39.2 C), Min:102.5 F (39.2 C), Max:102.5 F (39.2 C)  Recent Labs  Lab 06/28/17 1450 06/28/17 1852  WBC 7.0  --   CREATININE 1.17  --   LATICACIDVEN 3.9* 4.7*    Estimated Creatinine Clearance: 94.7 mL/min (by C-G formula based on SCr of 1.17 mg/dL).    Allergies  Allergen Reactions  . Hydrocodone-Acetaminophen Swelling    Caused mouth swelling    Antimicrobials this admission: vancomycin 6/21 >>  cefepime 6/21 >>  Zosyn 6/21 >> one dose  Dose adjustments this admission:   Microbiology results: 6/21 BCx:  6/21 UCx:    Thank you for allowing pharmacy to be a part of this patient's care.  Ramond Dial, Pharm.D, BCPS Clinical Pharmacist 06/28/2017 7:54 PM

## 2017-06-28 NOTE — ED Provider Notes (Signed)
[facility] Emergency Department Provider Note  Time seen: 3:22 PM  I have reviewed the triage vital signs and the nursing notes.   HISTORY  Chief Complaint Medication Reaction    HPI Jordan Hayden is a 52 y.o. male with a past medical history of alcohol use, cirrhosis, hypertension, presents to the emergency department with dizziness and lightheadedness.  According to the patient he took a new blood pressure medication this morning eplerenone.  Patient states since taking his medication he has felt dizzy, lightheaded and felt like he had a fever.  Upon arrival patient was noted to have a temperature 102.5 with a heart rate of 130 and a blood pressure 101/47.  Patient denies any cough or congestion.  Denies abdominal pain nausea or vomiting does state mild loose stool today.  Denies dysuria or hematuria.  No headache neck pain or back pain.   Past Medical History:  Diagnosis Date  . Alcohol abuse   . Alcoholic cirrhosis ([place])   . Alcoholism ([place])   . Cirrhosis [facility])     Patient Active Problem List   Diagnosis Date Noted  . Iron deficiency anemia 05/19/2017  . Anasarca 02/29/2016    History reviewed. No pertinent surgical history.  Prior to Admission medications   Medication Sig Start Date End Date Taking? Authorizing Provider  aspirin EC 81 MG tablet Take 1 tablet (81 mg total) by mouth daily. 03/05/16   Nicholes Mango, MD  ergocalciferol (VITAMIN D2) 50000 units capsule Take 1 capsule (50,000 Units total) by mouth once a week. 05/19/17   Earlie Server, MD  furosemide (LASIX) 40 MG tablet Take 1 tablet (40 mg total) by mouth 2 (two) times daily. 03/06/16   Nicholes Mango, MD  hydrOXYzine (ATARAX/VISTARIL) 25 MG tablet Take 25 mg by mouth 3 (three) times daily as needed. 04/01/17   [provider]  naproxen sodium (ANAPROX) 220 MG tablet Take 220 mg by mouth as needed.    [provider]  spironolactone (ALDACTONE) 50 MG tablet Take 1 tablet (50  mg total) by mouth 2 (two) times daily. 03/06/16   Nicholes Mango, MD    Allergies  Allergen Reactions  . Hydrocodone-Acetaminophen Swelling    Caused mouth swelling    Family History  Problem Relation Age of Onset  . Hypertension Mother   . Diabetes Father   . Cervical cancer Sister   . Cirrhosis Brother     Social History Social History   Tobacco Use  . Smoking status: Never Smoker  . Smokeless tobacco: Never Used  Substance Use Topics  . Alcohol use: No    Comment: quit 2 years   . Drug use: No    Review of Systems Constitutional: Subjective fever at home, objective fever in the emergency department Eyes: Negative for visual complaints ENT: Negative for recent illness/congestion.  Negative for sore throat. Cardiovascular: Negative for chest pain. Respiratory: Negative for shortness of breath.  Negative for cough Gastrointestinal: Negative for abdominal pain, vomiting.  Positive for loose stool. Genitourinary: Negative for urinary compaints Musculoskeletal: Negative for musculoskeletal complaints Skin: Negative for skin complaints  Neurological: Negative for headache All other ROS negative  ____________________________________________   PHYSICAL EXAM:  VITAL SIGNS: ED Triage Vitals  Enc Vitals Group     BP 06/28/17 1436 (!) 101/47     Pulse Rate 06/28/17 1436 (!) 130     Resp 06/28/17 1436 (!) 22     Temp 06/28/17 1436 (!) 102.5 F (39.2 C)  Temp Source 06/28/17 1436 Oral     SpO2 06/28/17 1436 98 %     Weight 06/28/17 1437 262 lb (118.8 kg)     Height 06/28/17 1437 5\' 8"  (1.727 m)     Head Circumference --      Peak Flow --      Pain Score 06/28/17 1436 0     Pain Loc --      Pain Edu? --      Excl. in [place]? --    Constitutional: Alert and oriented. Well appearing and in no distress. Eyes: Normal exam ENT   Head: Normocephalic and atraumatic.   Mouth/Throat: Mucous membranes are moist. Cardiovascular: Regular rhythm, rate around 130.  No  obvious murmur. Respiratory: Normal respiratory effort without tachypnea nor retractions. Breath sounds are clear, without wheeze rales or rhonchi. Gastrointestinal: Soft, slight distention of the abdomen but completely nontender per patient.  No CVA tenderness. Musculoskeletal: Mild lower extremity edema equal bilaterally, nontender to palpation.  Patient states this is chronic. Neurologic:  Normal speech and language. No gross focal neurologic deficits  Skin:  Skin is warm, dry and intact.  Psychiatric: Mood and affect are normal.   ____________________________________________    EKG  EKG reviewed and interpreted by myself shows sinus tachycardia 129 bpm with a narrow QRS, normal axis, normal intervals, nonspecific ST changes without ST elevation.  ____________________________________________    RADIOLOGY  Chest x-ray negative  ____________________________________________   INITIAL IMPRESSION / ASSESSMENT AND PLAN / ED COURSE  Pertinent labs & imaging results that were available during my care of the patient were reviewed by me and considered in my medical decision making (see chart for details).  Patient presents to the emergency department for lightheadedness and dizziness.  Found to have a fever 102.5 tachycardic, tachypneic with mild hypotension consistent with sepsis.  Differential would include infectious etiology such as viral infection, upper respiratory infection, pneumonia, urinary tract infection, intra-abdominal infection.  Reassuringly patient has a completely benign abdominal exam, completely nontender.  No cough or congestion with clear lung sounds.  Patient is awake alert oriented, mentating very well, has no real complaints at this time.  We will dose antipyretics, start the patient on broad-spectrum antibiotics, check labs, cultures, urinalysis, chest x-ray and continue to closely monitor.  Patient's work-up is overall nonrevealing.  Possible slight urinary  infection although does not appear to be overwhelming.  Patient's lactate is 3.9, white blood cell count normal.  Given the patient's fever with tachycardia meeting sepsis criteria and elevated lactic acid patient will be admitted to the hospitalist service for further work-up.  Patient continues to have a completely benign nontender abdominal exam.  Chest x-ray negative.  No headache or neck pain.  CRITICAL CARE Performed by: Harvest Dark   Total critical care time: 30 minutes  Critical care time was exclusive of separately billable procedures and treating other patients.  Critical care was necessary to treat or prevent imminent or life-threatening deterioration.  Critical care was time spent personally by me on the following activities: development of treatment plan with patient and/or surrogate as well as nursing, discussions with consultants, evaluation of patient's response to treatment, examination of patient, obtaining history from patient or surrogate, ordering and performing treatments and interventions, ordering and review of laboratory studies, ordering and review of radiographic studies, pulse oximetry and re-evaluation of patient's condition.   ____________________________________________   FINAL CLINICAL IMPRESSION(S) / ED DIAGNOSES  Sepsis Fever    Harvest Dark, MD 06/28/17 1844

## 2017-06-28 NOTE — Progress Notes (Signed)
Purpose of Encounter cirrhosis, sepsis, code status discussion  Parties in Attendance patient, girlfriend, son  he wants his girlfriend to be his healthcare power of attorney  Patients Decisional capacity patient is alert and oriented. Able to make decisions.  Patient has alcoholic cirrhosis. Quit alcohol two years back. Presently on liver transplant list. Follows at [facility]. We discussed regarding his sepsis of unknown etiology and the treatment plan. Patient verbalized understanding. Discuss code status. Patient mentioned that he would not want to be on life support. I explained to him intubation, CPR. He is undecided at this time. He has seen his brother on life support and does not like it. But he is unclear if he would want temporary measures. At this time the patient wants more time to discuss with his girlfriend. We will order full code. Need to carry further discussion.   Time spent - 18 minutes

## 2017-06-28 NOTE — Progress Notes (Signed)
CODE SEPSIS - PHARMACY COMMUNICATION  **Broad Spectrum Antibiotics should be administered within 1 hour of Sepsis diagnosis**  Time Code Sepsis Called/Page Received: 1523  Antibiotics Ordered: vancomycin/zosyn  Time of 1st antibiotic administration: 1534  Additional action taken by pharmacy:   If necessary, Name of Provider/Nurse Contacted:   Ramond Dial ,PharmD Clinical Pharmacist  06/28/2017  3:34 PM

## 2017-06-28 NOTE — ED Triage Notes (Signed)
Patient presents to ED via POV from home with multiple complaints. Patient reports diarrhea, palpitations, dizziness and increased thirst. Patient reports starting a new medication- eplerenone this morning. Patient reports taking 50mg  of the medication at 10 am. Patient is mildly hypotensive in traige with a MAP of 59. Patient is febrile and tachycardic as well.

## 2017-06-28 NOTE — H&P (Signed)
[facility] NAME: Jordan Hayden    MR#:  [id number]  DATE OF BIRTH:  06-Aug-1965  DATE OF ADMISSION:  06/28/2017  PRIMARY CARE PHYSICIAN: Tiger Body, MD   REQUESTING/REFERRING PHYSICIAN: Dr. Gwyndolyn Saxon  CHIEF COMPLAINT:   Chief Complaint  Patient presents with  . Medication Reaction    HISTORY OF PRESENT ILLNESS:  Jordan Hayden  is a 52 y.o. male with a known history of alcoholic cirrhosis on liver transplant list presented to the hospital with feeling lightheaded and dizzy. Patient was found to be hypotensive and systolic blood pressure in the 80s. He has started a new medication  eplerenone with first dose today morning after which he felt bad and dizzy. On arrival the emergency room he was found to have a fever of 102.6 and hypertension. Code sepsis activated and patient was fluid resuscitated. Lactic acid elevated at 3.9. Chest x-ray clear. Urinalysis normal. No neck pain. No abdominal pain or diarrhea. No rash. No recent antibiotic use. Patient is being admitted for sepsis with unknown source. Vancomycin and Zosyn given the emergency room. A paracentesis was attempted recently in [facility] and no ascites found. Patient feels his abdomen is the same size as before.  PAST MEDICAL HISTORY:   Past Medical History:  Diagnosis Date  . Alcohol abuse   . Alcoholic cirrhosis ([place])   . Alcoholism ([place])   . Cirrhosis ([place])     PAST SURGICAL HISTORY:  History reviewed. No pertinent surgical history.  SOCIAL HISTORY:   Social History   Tobacco Use  . Smoking status: Never Smoker  . Smokeless tobacco: Never Used  Substance Use Topics  . Alcohol use: No    Comment: quit 2 years     FAMILY HISTORY:   Family History  Problem Relation Age of Onset  . Hypertension Mother   . Diabetes Father   . Cervical cancer Sister   . Cirrhosis Brother     DRUG ALLERGIES:   Allergies  Allergen Reactions  .  Hydrocodone-Acetaminophen Swelling    Caused mouth swelling    REVIEW OF SYSTEMS:   Review of Systems  Constitutional: Positive for chills, fever and malaise/fatigue. Negative for weight loss.  HENT: Negative for hearing loss and nosebleeds.   Eyes: Negative for blurred vision, double vision and pain.  Respiratory: Negative for cough, hemoptysis, sputum production, shortness of breath and wheezing.   Cardiovascular: Negative for chest pain, palpitations, orthopnea and leg swelling.  Gastrointestinal: Negative for abdominal pain, constipation, diarrhea, nausea and vomiting.  Genitourinary: Negative for dysuria and hematuria.  Musculoskeletal: Negative for back pain, falls and myalgias.  Skin: Negative for rash.  Neurological: Positive for dizziness. Negative for tremors, sensory change, speech change, focal weakness, seizures and headaches.  Endo/Heme/Allergies: Does not bruise/bleed easily.  Psychiatric/Behavioral: Negative for depression and memory loss. The patient is not nervous/anxious.     MEDICATIONS AT HOME:   Prior to Admission medications   Medication Sig Start Date End Date Taking? Authorizing Provider  aspirin EC 81 MG tablet Take 1 tablet (81 mg total) by mouth daily. 03/05/16  Yes Gouru, Illene Silver, MD  eplerenone (INSPRA) 50 MG tablet Take 1 tablet by mouth daily. 05/24/17 05/24/18 Yes [provider]  ergocalciferol (VITAMIN D2) 50000 units capsule Take 1 capsule (50,000 Units total) by mouth once a week. 05/19/17  Yes Earlie Server, MD  furosemide (LASIX) 40 MG tablet Take 1 tablet (40 mg total) by mouth 2 (two) times daily. Patient taking  differently: Take 20-40 mg by mouth 2 (two) times daily. 40MG -AM 20MG -PM 03/06/16  Yes Gouru, Aruna, MD  hydrOXYzine (ATARAX/VISTARIL) 25 MG tablet Take 25 mg by mouth 3 (three) times daily as needed. 04/01/17  Yes [provider]  spironolactone (ALDACTONE) 50 MG tablet Take 1 tablet (50 mg total) by mouth 2 (two) times  daily. Patient not taking: Reported on 06/28/2017 03/06/16   Nicholes Mango, MD     VITAL SIGNS:  Blood pressure (!) 103/58, pulse (!) 115, temperature (!) 102.5 F (39.2 C), temperature source Oral, resp. rate (!) 32, height 5\' 8"  (1.727 m), weight 118.8 kg (262 lb), SpO2 100 %.  PHYSICAL EXAMINATION:  Physical Exam  GENERAL:  52 y.o.-year-old patient lying in the bed with no acute distress.  EYES: Pupils equal, round, reactive to light and accommodation. No scleral icterus. Extraocular muscles intact.  HEENT: Head atraumatic, normocephalic. Oropharynx and nasopharynx clear. No oropharyngeal erythema, moist oral mucosa  NECK:  Supple, no jugular venous distention. No thyroid enlargement, no tenderness.  LUNGS: Normal breath sounds bilaterally, no wheezing, rales, rhonchi. No use of accessory muscles of respiration.  CARDIOVASCULAR: S1, S2 normal. No murmurs, rubs, or gallops.  ABDOMEN: Soft, nontender, nondistended. Bowel sounds present. No organomegaly or mass.  EXTREMITIES: No pedal edema, cyanosis, or clubbing. + 2 pedal & radial pulses b/l.   NEUROLOGIC: Cranial nerves II through XII are intact. No focal Motor or sensory deficits appreciated b/l PSYCHIATRIC: The patient is alert and oriented x 3. Good affect.  SKIN: No obvious rash, lesion, or ulcer.   LABORATORY PANEL:   CBC Recent Labs  Lab 06/28/17 1450  WBC 7.0  HGB 8.0*  HCT 24.0*  PLT 65*   ------------------------------------------------------------------------------------------------------------------  Chemistries  Recent Labs  Lab 06/28/17 1450  NA 135  K 3.4*  CL 110  CO2 16*  GLUCOSE 90  BUN 13  CREATININE 1.17  CALCIUM 7.2*  AST 47*  ALT 18  ALKPHOS 384*  BILITOT 2.1*   ------------------------------------------------------------------------------------------------------------------  Cardiac Enzymes No results for input(s): TROPONINI in the last 168  hours. ------------------------------------------------------------------------------------------------------------------  RADIOLOGY:  Dg Chest 2 View  Result Date: 06/28/2017 CLINICAL DATA:  Patient reports diarrhea, palpitations, dizziness and increased thirst. Patient reports starting a new medication- eplerenone this morning. Patient reports taking 50mg  of the medication at 10 am. Mildly hypertensive. EXAM: CHEST - 2 VIEW COMPARISON:  None. FINDINGS: Heart size is normal. The lungs are free of focal consolidations. There is trace LEFT pleural effusion or pleural thickening. No pulmonary edema. IMPRESSION: 1.  No evidence for acute  abnormality. 2. Trace LEFT pleural effusion versus pleural thickening. Electronically Signed   By: Nolon Nations M.D.   On: 06/28/2017 15:18     IMPRESSION AND PLAN:   *Sepsis. Source unknown. Afebrile and hypotensive. Will give fluid bolus. Start IV vancomycin and several beam while cultures are pending. Repeat lactic acid in three hours.  *Alcoholic liver cirrhosis. Patient has quit alcohol for two years. On [facility] transplant list. Will hold Lasix and aldosterone antagonist.  *Thrombocytopenia, chronic. Due to cirrhosis.  DVT prophylaxis with SCDs.  All the records are reviewed and case discussed with ED provider. Management plans discussed with the patient, family and they are in agreement.  CODE STATUS: full code  TOTAL TIME TAKING CARE OF THIS PATIENT: 40 minutes.   Leia Alf Javionna Leder M.D on 06/28/2017 at 7:48 PM  Between 7am to 6pm - Pager - [phone]  After 6pm go to [email or web address] - [place]  [facility]  Office  [phone]  CC: Primary care physician; Dionel Body, MD  Note: This dictation was prepared with Dragon dictation along with smaller phrase technology. Any transcriptional errors that result from this process are unintentional.

## 2017-06-29 ENCOUNTER — Inpatient Hospital Stay: Payer: Medicaid Other

## 2017-06-29 DIAGNOSIS — K7031 Alcoholic cirrhosis of liver with ascites: Secondary | ICD-10-CM

## 2017-06-29 DIAGNOSIS — D649 Anemia, unspecified: Secondary | ICD-10-CM

## 2017-06-29 LAB — BASIC METABOLIC PANEL
Anion gap: 7 (ref 5–15)
BUN: 22 mg/dL — AB (ref 6–20)
CALCIUM: 7.4 mg/dL — AB (ref 8.9–10.3)
CHLORIDE: 114 mmol/L — AB (ref 101–111)
CO2: 15 mmol/L — AB (ref 22–32)
CREATININE: 1.62 mg/dL — AB (ref 0.61–1.24)
GFR calc non Af Amer: 48 mL/min — ABNORMAL LOW (ref >60)
GFR, EST AFRICAN AMERICAN: 56 mL/min — AB (ref >60)
Glucose, Bld: 98 mg/dL (ref 65–99)
Potassium: 4.2 mmol/L (ref 3.5–5.1)
Sodium: 136 mmol/L (ref 135–145)

## 2017-06-29 LAB — FERRITIN: FERRITIN: 30 ng/mL (ref 24–336)

## 2017-06-29 LAB — BLOOD CULTURE ID PANEL (REFLEXED)
Acinetobacter baumannii: NOT DETECTED
CANDIDA KRUSEI: NOT DETECTED
Candida albicans: NOT DETECTED
Candida glabrata: NOT DETECTED
Candida parapsilosis: NOT DETECTED
Candida tropicalis: NOT DETECTED
ENTEROCOCCUS SPECIES: NOT DETECTED
Enterobacter cloacae complex: NOT DETECTED
Enterobacteriaceae species: NOT DETECTED
Escherichia coli: NOT DETECTED
HAEMOPHILUS INFLUENZAE: NOT DETECTED
Klebsiella oxytoca: NOT DETECTED
Klebsiella pneumoniae: NOT DETECTED
LISTERIA MONOCYTOGENES: NOT DETECTED
NEISSERIA MENINGITIDIS: NOT DETECTED
PROTEUS SPECIES: NOT DETECTED
PSEUDOMONAS AERUGINOSA: NOT DETECTED
STAPHYLOCOCCUS SPECIES: NOT DETECTED
STREPTOCOCCUS AGALACTIAE: DETECTED — AB
STREPTOCOCCUS PNEUMONIAE: NOT DETECTED
Serratia marcescens: NOT DETECTED
Staphylococcus aureus (BCID): NOT DETECTED
Streptococcus pyogenes: NOT DETECTED
Streptococcus species: DETECTED — AB

## 2017-06-29 LAB — HEMOGLOBIN AND HEMATOCRIT, BLOOD
HEMATOCRIT: 24.6 % — AB (ref 40.0–52.0)
Hemoglobin: 8 g/dL — ABNORMAL LOW (ref 13.0–18.0)

## 2017-06-29 LAB — CBC
HCT: 21.6 % — ABNORMAL LOW (ref 40.0–52.0)
Hemoglobin: 6.9 g/dL — ABNORMAL LOW (ref 13.0–18.0)
MCH: 27.5 pg (ref 26.0–34.0)
MCHC: 31.9 g/dL — ABNORMAL LOW (ref 32.0–36.0)
MCV: 86 fL (ref 80.0–100.0)
PLATELETS: 56 10*3/uL — AB (ref 150–440)
RBC: 2.51 MIL/uL — ABNORMAL LOW (ref 4.40–5.90)
RDW: 17.5 % — AB (ref 11.5–14.5)
WBC: 20.8 10*3/uL — ABNORMAL HIGH (ref 3.8–10.6)

## 2017-06-29 LAB — OCCULT BLOOD X 1 CARD TO LAB, STOOL: Fecal Occult Bld: POSITIVE — AB

## 2017-06-29 LAB — IRON AND TIBC
Iron: 14 ug/dL — ABNORMAL LOW (ref 45–182)
Saturation Ratios: 5 % — ABNORMAL LOW (ref 17.9–39.5)
TIBC: 274 ug/dL (ref 250–450)
UIBC: 260 ug/dL

## 2017-06-29 LAB — ABO/RH: ABO/RH(D): B POS

## 2017-06-29 LAB — TSH: TSH: 2.143 u[IU]/mL (ref 0.350–4.500)

## 2017-06-29 LAB — VITAMIN B12: Vitamin B-12: 865 pg/mL (ref 180–914)

## 2017-06-29 LAB — RETICULOCYTES
RBC.: 2.61 MIL/uL — ABNORMAL LOW (ref 4.40–5.90)
RETIC COUNT ABSOLUTE: 75.7 10*3/uL (ref 19.0–183.0)
RETIC CT PCT: 2.9 % (ref 0.4–3.1)

## 2017-06-29 LAB — PREPARE RBC (CROSSMATCH)

## 2017-06-29 LAB — FOLATE: Folate: 5.5 ng/mL — ABNORMAL LOW (ref >5.9)

## 2017-06-29 MED ORDER — FAMOTIDINE IN NACL 20-0.9 MG/50ML-% IV SOLN
20.0000 mg | Freq: Two times a day (BID) | INTRAVENOUS | Status: DC
  Administered 2017-06-29 – 2017-07-02 (×6): 20 mg via INTRAVENOUS
  Filled 2017-06-29 (×6): qty 50

## 2017-06-29 MED ORDER — ACETAMINOPHEN 325 MG PO TABS
650.0000 mg | ORAL_TABLET | Freq: Once | ORAL | Status: AC
  Administered 2017-06-29: 650 mg via ORAL
  Filled 2017-06-29: qty 2

## 2017-06-29 MED ORDER — SALINE SPRAY 0.65 % NA SOLN
1.0000 | NASAL | Status: DC | PRN
  Filled 2017-06-29: qty 44

## 2017-06-29 MED ORDER — FUROSEMIDE 10 MG/ML IJ SOLN
20.0000 mg | Freq: Once | INTRAMUSCULAR | Status: DC
  Filled 2017-06-29: qty 4

## 2017-06-29 MED ORDER — SPIRONOLACTONE 25 MG PO TABS
12.5000 mg | ORAL_TABLET | Freq: Two times a day (BID) | ORAL | Status: DC
  Administered 2017-06-30 – 2017-07-02 (×5): 12.5 mg via ORAL
  Filled 2017-06-29: qty 0.5
  Filled 2017-06-29: qty 1
  Filled 2017-06-29 (×3): qty 0.5
  Filled 2017-06-29: qty 1
  Filled 2017-06-29 (×2): qty 0.5
  Filled 2017-06-29: qty 1

## 2017-06-29 MED ORDER — VITAMIN K1 10 MG/ML IJ SOLN
10.0000 mg | Freq: Every day | INTRAMUSCULAR | Status: AC
  Administered 2017-06-29 – 2017-07-01 (×3): 10 mg via SUBCUTANEOUS
  Filled 2017-06-29 (×3): qty 1

## 2017-06-29 MED ORDER — FUROSEMIDE 40 MG PO TABS
40.0000 mg | ORAL_TABLET | Freq: Two times a day (BID) | ORAL | Status: DC
  Administered 2017-06-30 – 2017-07-02 (×5): 40 mg via ORAL
  Filled 2017-06-29 (×5): qty 1

## 2017-06-29 MED ORDER — DIPHENHYDRAMINE HCL 25 MG PO CAPS
25.0000 mg | ORAL_CAPSULE | Freq: Once | ORAL | Status: AC
  Administered 2017-06-29: 25 mg via ORAL
  Filled 2017-06-29: qty 1

## 2017-06-29 MED ORDER — SODIUM CHLORIDE 0.9% IV SOLUTION
Freq: Once | INTRAVENOUS | Status: AC
  Administered 2017-06-29: 15:00:00 via INTRAVENOUS

## 2017-06-29 MED ORDER — GUAIFENESIN-DM 100-10 MG/5ML PO SYRP
5.0000 mL | ORAL_SOLUTION | ORAL | Status: DC | PRN
  Administered 2017-06-29: 5 mL via ORAL
  Filled 2017-06-29: qty 5

## 2017-06-29 MED ORDER — FUROSEMIDE 10 MG/ML IJ SOLN
20.0000 mg | Freq: Once | INTRAMUSCULAR | Status: AC
  Administered 2017-06-29: 20 mg via INTRAVENOUS
  Filled 2017-06-29: qty 4

## 2017-06-29 MED ORDER — FLUTICASONE PROPIONATE 50 MCG/ACT NA SUSP
2.0000 | Freq: Every day | NASAL | Status: DC
  Administered 2017-06-29 – 2017-07-01 (×3): 2 via NASAL
  Filled 2017-06-29: qty 16

## 2017-06-29 NOTE — Consult Note (Addendum)
Jordan Hayden , MD [address] [place], [place], [place], [address] [address], [place], [place], [place], [address] Phone: [phone]  Fax: [phone]  Consultation  Referring Provider:   Dr Jerelyn Charles  Primary Care Physician:  Abdirizak Body, MD Primary Gastroenterologist:  Dr. [facility]          Reason for Consultation:     Anemia   Date of Admission:  06/28/2017 Date of Consultation:  06/29/2017         HPI:   Jordan Hayden is a 52 y.o. male is a patient of [facility].  Last seen at their office 4 days back.  He has a history of decompensated alcoholic liver cirrhosis with ascites.  On Aldactone and Lasix as an outpatient.  He also follows at [facility] and apparently has an upper endoscopy scheduled with them.  Based on the last GI note it appears that upper endoscopy was not done at this hospital due to elevated INR.  No prior upper endoscopy performed at this hospital as per epic.  Right upper quadrant ultrasound performed in January 2019 shows features suggestive of cirrhosis.  No recent imaging to assess for [place].  He is presently being treated in the ICU for streptococcal sepsis.  AKI.  And we have been consulted for anemia.  No admission was noted to have an elevated lactic acid of 3.9.  Hemoglobin of 8 g with an MCV of 83.1.  Platelet count of 65.  INR of 2.25.  The creatinine is presently elevated at 1.62.  White cell count is 20 and hemoglobin is 6.9 g.  He denies any hematemesis or melena. Per nursing stool form today was brown. Denies any abdominal pain. He has been taking alleve daily for many Monday for back pain. Quit all alcohol 2 years back.   Past Medical History:  Diagnosis Date  . Alcohol abuse   . Alcoholic cirrhosis ([place])   . Alcoholism ([place])   . Cirrhosis ([place])     History reviewed. No pertinent surgical history.  Prior to Admission medications   Medication Sig Start Date End Date Taking? Authorizing Provider  aspirin EC 81 MG tablet Take 1 tablet  (81 mg total) by mouth daily. 03/05/16  Yes Gouru, Illene Silver, MD  eplerenone (INSPRA) 50 MG tablet Take 1 tablet by mouth daily. 05/24/17 05/24/18 Yes [provider]  ergocalciferol (VITAMIN D2) 50000 units capsule Take 1 capsule (50,000 Units total) by mouth once a week. 05/19/17  Yes Earlie Server, MD  furosemide (LASIX) 40 MG tablet Take 1 tablet (40 mg total) by mouth 2 (two) times daily. Patient taking differently: Take 20-40 mg by mouth 2 (two) times daily. 40MG -AM 20MG -PM 03/06/16  Yes Gouru, Aruna, MD  hydrOXYzine (ATARAX/VISTARIL) 25 MG tablet Take 25 mg by mouth 3 (three) times daily as needed. 04/01/17  Yes [provider]  spironolactone (ALDACTONE) 50 MG tablet Take 1 tablet (50 mg total) by mouth 2 (two) times daily. Patient not taking: Reported on 06/28/2017 03/06/16   Nicholes Mango, MD    Family History  Problem Relation Age of Onset  . Hypertension Mother   . Diabetes Father   . Cervical cancer Sister   . Cirrhosis Brother      Social History   Tobacco Use  . Smoking status: Never Smoker  . Smokeless tobacco: Never Used  Substance Use Topics  . Alcohol use: No    Comment: quit 2 years   . Drug use: No  Allergies as of 06/28/2017 - Review Complete 06/28/2017  Allergen Reaction Noted  . Hydrocodone-acetaminophen Swelling 02/17/2016    Review of Systems:    All systems reviewed and negative except where noted in HPI.   Physical Exam:  Vital signs in last 24 hours: Temp:  [98.5 F (36.9 C)-102.5 F (39.2 C)] 98.6 F (37 C) (06/22 0521) Pulse Rate:  [106-130] 106 (06/22 0521) Resp:  [20-32] 20 (06/22 0521) BP: (92-113)/(47-61) 99/59 (06/22 0521) SpO2:  [96 %-100 %] 96 % (06/22 0521) Weight:  [262 lb (118.8 kg)] 262 lb (118.8 kg) (06/21 1437)   General:   Pleasant, cooperative in NAD Head:  Normocephalic and atraumatic. Eyes:   No icterus.   Conjunctiva pink. PERRLA. Ears:  Normal auditory acuity. Neck:  Supple; no masses or thyroidomegaly Lungs:  Respirations even and unlabored. Lungs clear to auscultation bilaterally.   No wheezes, crackles, or rhonchi.  Heart:  Regular rate and rhythm;  Without murmur, clicks, rubs or gallops Abdomen:  Soft, nondistended, nontender. Normal bowel sounds. No appreciable masses or hepatomegaly.  No rebound or guarding.  Neurologic:  Alert and oriented x3;  grossly normal neurologically. Skin:  Intact without significant lesions or rashes. Cervical Nodes:  No significant cervical adenopathy. Psych:  Alert and cooperative. Normal affect.  LAB RESULTS: Recent Labs    06/28/17 1450 06/29/17 0434  WBC 7.0 20.8*  HGB 8.0* 6.9*  HCT 24.0* 21.6*  PLT 65* 56*   BMET Recent Labs    06/28/17 1450 06/29/17 0434  NA 135 136  K 3.4* 4.2  CL 110 114*  CO2 16* 15*  GLUCOSE 90 98  BUN 13 22*  CREATININE 1.17 1.62*  CALCIUM 7.2* 7.4*   LFT Recent Labs    06/28/17 1450  PROT 5.9*  ALBUMIN 2.2*  AST 47*  ALT 18  ALKPHOS 384*  BILITOT 2.1*   PT/INR Recent Labs    06/28/17 1450  LABPROT 24.7*  INR 2.25    STUDIES: Dg Chest 2 View  Result Date: 06/28/2017 CLINICAL DATA:  Patient reports diarrhea, palpitations, dizziness and increased thirst. Patient reports starting a new medication- eplerenone this morning. Patient reports taking 50mg  of the medication at 10 am. Mildly hypertensive. EXAM: CHEST - 2 VIEW COMPARISON:  None. FINDINGS: Heart size is normal. The lungs are free of focal consolidations. There is trace LEFT pleural effusion or pleural thickening. No pulmonary edema. IMPRESSION: 1.  No evidence for acute  abnormality. 2. Trace LEFT pleural effusion versus pleural thickening. Electronically Signed   By: Nolon Nations M.D.   On: 06/28/2017 15:18      Impression / Plan:   Jordan Hayden is a 52 y.o. y/o male with decompensated alcoholic liver cirrhosis with ascites.  Follows with Dr. Gustavo Lah at [facility].  Review of past office notes suggests that an EGD has not been  performed for unknown reasons.  He has been followed with them since early 2018.  Admitted with sepsis.  We have been consulted for an anemia with no overt GI bleeding.  Noted his INR is elevated at 2.2 for over a year.  He follows at [facility] for transplant evaluation.  With no overt GI bleeding he makes variceal bleeding less likely.  There is no significant elevation in the BUN/creatinine ratio also suggesting less likelihood of an upper GI bleed.  He has metabolic acidosis as this point of time with a low CO2 of 15 very likely from hypovolemia secondary to sepsis and possibly anemia.  At this point of time I would suggest   1.  IV PPI  2. Monitor CBC and transfuse  3. He is already on antibiotics.   4. There is no contraindication for an upper endoscopy to evaluate for varices irrespective of the INR.  Patient with cirrhosis very often are prothrombotic rather than at high risk for bleeding from the high INR.   5. Subcutaneous vitamin K 10 mg once daily for 3 days.  6.  It is very likely that his creatinine is elevated more from AKI+/- NSAID than from a hepatorenal syndrome.  To replete his volume you could use either IV fluids or albumin. 7.  Stool is brown in color hence unlikely having a significant GI bleed. Low Hb could be from hemodilution. It is posible he has some gastritis from NSAID use. Suggest to stop all NSAID;s and will consider EGD possibly Monday when he has been fluid resuscitated and sepsis been treated.  8. Low iron levels consider IV iron  Thank you for involving me in the care of this patient.      LOS: 1 day   Jordan Bellows, MD  06/29/2017, 11:01 AM

## 2017-06-29 NOTE — Progress Notes (Signed)
PHARMACY - PHYSICIAN COMMUNICATION CRITICAL VALUE ALERT - BLOOD CULTURE IDENTIFICATION (BCID)  Jordan Hayden is an 52 y.o. male who presented to [facility] on 06/28/2017 with a chief complaint of medication reaction. Patient recently started eplerenone for fluid retention s/t h/o alcoholic cirrhosis  Assessment:  Tmax 102.5, tachycardic, tachypneic, systolic 160'F, LA 3.9 >> 4.7, UA bacteria rare, CXR trace left pleural effusion/thickening, 4/4 [facility] BCID strep agalactiaea  Name of physician (or Provider) Contacted: Arta Silence  Current antibiotics: Vanc, cefepime  Changes to prescribed antibiotics recommended:  Recommendations declined by provider due to considering there is an unknown source, provider wants to continue current abx until ID can be consulted and/or source determined w/ further cx/sx  Results for orders placed or performed during the hospital encounter of 06/28/17  Blood Culture ID Panel (Reflexed) (Collected: 06/28/2017  2:50 PM)  Result Value Ref Range   Enterococcus species NOT DETECTED NOT DETECTED   Listeria monocytogenes NOT DETECTED NOT DETECTED   Staphylococcus species NOT DETECTED NOT DETECTED   Staphylococcus aureus NOT DETECTED NOT DETECTED   Streptococcus species DETECTED (A) NOT DETECTED   Streptococcus agalactiae DETECTED (A) NOT DETECTED   Streptococcus pneumoniae NOT DETECTED NOT DETECTED   Streptococcus pyogenes NOT DETECTED NOT DETECTED   Acinetobacter baumannii NOT DETECTED NOT DETECTED   Enterobacteriaceae species NOT DETECTED NOT DETECTED   Enterobacter cloacae complex NOT DETECTED NOT DETECTED   Escherichia coli NOT DETECTED NOT DETECTED   Klebsiella oxytoca NOT DETECTED NOT DETECTED   Klebsiella pneumoniae NOT DETECTED NOT DETECTED   Proteus species NOT DETECTED NOT DETECTED   Serratia marcescens NOT DETECTED NOT DETECTED   Haemophilus influenzae NOT DETECTED NOT DETECTED   Neisseria meningitidis NOT DETECTED NOT DETECTED   Pseudomonas  aeruginosa NOT DETECTED NOT DETECTED   Candida albicans NOT DETECTED NOT DETECTED   Candida glabrata NOT DETECTED NOT DETECTED   Candida krusei NOT DETECTED NOT DETECTED   Candida parapsilosis NOT DETECTED NOT DETECTED   Candida tropicalis NOT DETECTED NOT DETECTED   Tobie Lords, PharmD, BCPS Clinical Pharmacist 06/29/2017

## 2017-06-29 NOTE — Progress Notes (Signed)
[facility] [place] NAME: Jordan Hayden    MR#:  [id number]  DATE OF BIRTH:  14-Jul-1965  SUBJECTIVE:  CHIEF COMPLAINT:   Chief Complaint  Patient presents with  . Medication Reaction  Patient denies any rectal bleeding, patient states that he never had any endoscopy before in his life, no events overnight per nursing staff, noted worsening anemia in renal functioning, noted blood cultures positive for strep  REVIEW OF SYSTEMS:  CONSTITUTIONAL: No fever, fatigue or weakness.  EYES: No blurred or double vision.  EARS, NOSE, AND THROAT: No tinnitus or ear pain.  RESPIRATORY: No cough, shortness of breath, wheezing or hemoptysis.  CARDIOVASCULAR: No chest pain, orthopnea, edema.  GASTROINTESTINAL: No nausea, vomiting, diarrhea or abdominal pain.  GENITOURINARY: No dysuria, hematuria.  ENDOCRINE: No polyuria, nocturia,  HEMATOLOGY: No anemia, easy bruising or bleeding SKIN: No rash or lesion. MUSCULOSKELETAL: No joint pain or arthritis.   NEUROLOGIC: No tingling, numbness, weakness.  PSYCHIATRY: No anxiety or depression.   ROS  DRUG ALLERGIES:   Allergies  Allergen Reactions  . Hydrocodone-Acetaminophen Swelling    Caused mouth swelling    VITALS:  Blood pressure (!) 99/59, pulse (!) 106, temperature 98.6 F (37 C), temperature source Oral, resp. rate 20, height 5\' 8"  (1.727 m), weight 118.8 kg (262 lb), SpO2 96 %.  PHYSICAL EXAMINATION:  GENERAL:  52 y.o.-year-old patient lying in the bed with no acute distress.  EYES: Pupils equal, round, reactive to light and accommodation. No scleral icterus. Extraocular muscles intact.  HEENT: Head atraumatic, normocephalic. Oropharynx and nasopharynx clear.  NECK:  Supple, no jugular venous distention. No thyroid enlargement, no tenderness.  LUNGS: Normal breath sounds bilaterally, no wheezing, rales,rhonchi or crepitation. No use of accessory muscles of respiration.  CARDIOVASCULAR: S1, S2  normal. No murmurs, rubs, or gallops.  ABDOMEN: Soft, nontender, nondistended. Bowel sounds present. No organomegaly or mass.  EXTREMITIES: No pedal edema, cyanosis, or clubbing.  NEUROLOGIC: Cranial nerves II through XII are intact. Muscle strength 5/5 in all extremities. Sensation intact. Gait not checked.  PSYCHIATRIC: The patient is alert and oriented x 3.  SKIN: No obvious rash, lesion, or ulcer.   Physical Exam LABORATORY PANEL:   CBC Recent Labs  Lab 06/29/17 0434  WBC 20.8*  HGB 6.9*  HCT 21.6*  PLT 56*   ------------------------------------------------------------------------------------------------------------------  Chemistries  Recent Labs  Lab 06/28/17 1450 06/29/17 0434  NA 135 136  K 3.4* 4.2  CL 110 114*  CO2 16* 15*  GLUCOSE 90 98  BUN 13 22*  CREATININE 1.17 1.62*  CALCIUM 7.2* 7.4*  AST 47*  --   ALT 18  --   ALKPHOS 384*  --   BILITOT 2.1*  --    ------------------------------------------------------------------------------------------------------------------  Cardiac Enzymes No results for input(s): TROPONINI in the last 168 hours. ------------------------------------------------------------------------------------------------------------------  RADIOLOGY:  Dg Chest 2 View  Result Date: 06/28/2017 CLINICAL DATA:  Patient reports diarrhea, palpitations, dizziness and increased thirst. Patient reports starting a new medication- eplerenone this morning. Patient reports taking 50mg  of the medication at 10 am. Mildly hypertensive. EXAM: CHEST - 2 VIEW COMPARISON:  None. FINDINGS: Heart size is normal. The lungs are free of focal consolidations. There is trace LEFT pleural effusion or pleural thickening. No pulmonary edema. IMPRESSION: 1.  No evidence for acute  abnormality. 2. Trace LEFT pleural effusion versus pleural thickening. Electronically Signed   By: Nolon Nations M.D.   On: 06/28/2017 15:18    ASSESSMENT AND  PLAN:  *Acute streptococcal  sepsis Exacerbated by worsening anemia Blood cultures noted for streptococcal species-follow-up on sensitivities/outstanding cultures, continue cefepime, discontinue vancomycin, IV fluids for rehydration  *Acute symptomatic anemia Etiology unknown Check anemia work-up, guaiac all stools, gastroenterology to see, transfuse 2 units packed red blood cells given relative hypotension, CBC daily, and transfuse as needed  *Acute kidney injury Compounded by symptomatic anemia ?  Hepatorenal syndrome Transfuse as stated above, avoid nephrotoxic agents, strict I&O monitoring, check renal ultrasound, nephrology to see, BMP in the morning  *Chronic Alcoholic liver cirrhosis With associated thrombocytopenia Patient has quit alcohol for two years, on [facility] transplant list Restart Lasix and spironolactone   *Chronic thrombocytopenia secondary to alcoholism/liver cirrhosis  Noted worsening  CBC daily and transfuse as needed    DVT prophylaxis with SCDs.  All the records are reviewed and case discussed with Care Management/Social Workerr. Management plans discussed with the patient, family and they are in agreement.  CODE STATUS: full TOTAL TIME TAKING CARE OF THIS PATIENT: 35 minutes.     POSSIBLE D/C IN 2-3 DAYS, DEPENDING ON CLINICAL CONDITION.   Avel Peace Madysun Thall M.D on 06/29/2017   Between 7am to 6pm - Pager - [phone]  After 6pm go to [email or web address] - password [email or web address]  Office  [phone]  CC: Primary care physician; Absalom Body, MD  Note: This dictation was prepared with Dragon dictation along with smaller phrase technology. Any transcriptional errors that result from this process are unintentional.

## 2017-06-30 ENCOUNTER — Inpatient Hospital Stay (HOSPITAL_COMMUNITY)
Admit: 2017-06-30 | Discharge: 2017-06-30 | Disposition: A | Discharge status: Home / Self Care | Payer: Medicaid Other | Attending: Family Medicine | Admitting: Family Medicine

## 2017-06-30 DIAGNOSIS — A419 Sepsis, unspecified organism: Secondary | ICD-10-CM

## 2017-06-30 DIAGNOSIS — I5031 Acute diastolic (congestive) heart failure: Secondary | ICD-10-CM

## 2017-06-30 LAB — PROTIME-INR
INR: 2.83
PROTHROMBIN TIME: 29.5 s — AB (ref 11.4–15.2)

## 2017-06-30 LAB — ECHOCARDIOGRAM COMPLETE
Height: 68 in
Weight: 4324.54 oz

## 2017-06-30 LAB — BPAM RBC
Blood Product Expiration Date: 201907052359
ISSUE DATE / TIME: 201906221513
Unit Type and Rh: 7300

## 2017-06-30 LAB — TYPE AND SCREEN
ABO/RH(D): B POS
ANTIBODY SCREEN: NEGATIVE
UNIT DIVISION: 0

## 2017-06-30 LAB — URINE CULTURE: Culture: <10000 — AB

## 2017-06-30 LAB — BASIC METABOLIC PANEL
ANION GAP: 6 (ref 5–15)
BUN: 21 mg/dL — ABNORMAL HIGH (ref 6–20)
CALCIUM: 7.2 mg/dL — AB (ref 8.9–10.3)
CO2: 17 mmol/L — AB (ref 22–32)
CREATININE: 1.28 mg/dL — AB (ref 0.61–1.24)
Chloride: 114 mmol/L — ABNORMAL HIGH (ref 101–111)
Glucose, Bld: 72 mg/dL (ref 65–99)
Potassium: 3.9 mmol/L (ref 3.5–5.1)
SODIUM: 137 mmol/L (ref 135–145)

## 2017-06-30 LAB — CBC
HEMATOCRIT: 24.1 % — AB (ref 40.0–52.0)
Hemoglobin: 7.9 g/dL — ABNORMAL LOW (ref 13.0–18.0)
MCH: 27.5 pg (ref 26.0–34.0)
MCHC: 32.6 g/dL (ref 32.0–36.0)
MCV: 84.4 fL (ref 80.0–100.0)
PLATELETS: 57 10*3/uL — AB (ref 150–440)
RBC: 2.85 MIL/uL — ABNORMAL LOW (ref 4.40–5.90)
RDW: 16.9 % — AB (ref 11.5–14.5)
WBC: 18.3 10*3/uL — AB (ref 3.8–10.6)

## 2017-06-30 LAB — BRAIN NATRIURETIC PEPTIDE: B Natriuretic Peptide: 322 pg/mL — ABNORMAL HIGH (ref 0.0–100.0)

## 2017-06-30 LAB — VANCOMYCIN, TROUGH: Vancomycin Tr: 13 ug/mL — ABNORMAL LOW (ref 15–20)

## 2017-06-30 MED ORDER — SODIUM CHLORIDE 0.9 % IV SOLN
2.0000 g | INTRAVENOUS | Status: DC
  Administered 2017-06-30: 2 g via INTRAVENOUS
  Filled 2017-06-30: qty 2
  Filled 2017-06-30: qty 20

## 2017-06-30 MED ORDER — FERROUS SULFATE 325 (65 FE) MG PO TABS
325.0000 mg | ORAL_TABLET | Freq: Three times a day (TID) | ORAL | Status: DC
  Administered 2017-06-30 – 2017-07-02 (×7): 325 mg via ORAL
  Filled 2017-06-30 (×7): qty 1

## 2017-06-30 MED ORDER — SODIUM CHLORIDE 0.9 % IV SOLN
100.0000 mg | Freq: Once | INTRAVENOUS | Status: AC
  Administered 2017-06-30: 100 mg via INTRAVENOUS
  Filled 2017-06-30: qty 2

## 2017-06-30 MED ORDER — SODIUM CHLORIDE 0.9 % IV SOLN: 1.0000 g | INTRAVENOUS | Status: DC

## 2017-06-30 NOTE — Consult Note (Signed)
[facility] CONSULT NOTE    Date: 06/30/2017                  Patient Name:  Jordan Hayden  MRN: [id number]  DOB: August 09, 1965  Age / Sex: 52 y.o., male         PCP: Denis Body, MD                 Service Requesting Consult: Hospitalist                 Reason for Consult: Acute renal failure            History of Present Illness: Patient is a 52 y.o. 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  When he first presented he was found to be hypotensive with a systolic blood pressure in the 80s.  It appears that he was recently started on eplerenone as an outpatient.  Later when he arrived here he was found to have a fever of 102.6.  Lactic acid was also elevated at 3.9.  He was initially treated with IV fluid hydration.  Initial creatinine when he came was 1.17.  It rose to 1.62.  IV fluids were stopped and he was started back on Lasix.  Today BUN is down to 21 with a creatinine of 1.28.  In addition patient was found to be iron deficient and he was started on intravenous iron.     Medications: Outpatient medications: Medications Prior to Admission  Medication Sig Dispense Refill Last Dose  . aspirin EC 81 MG tablet Take 1 tablet (81 mg total) by mouth daily.   06/28/2017 at Unknown time  . eplerenone (INSPRA) 50 MG tablet Take 1 tablet by mouth daily.   06/28/2017 at Unknown time  . ergocalciferol (VITAMIN D2) 50000 units capsule Take 1 capsule (50,000 Units total) by mouth once a week. 8 capsule 0 Past Week at Unknown time  . furosemide (LASIX) 40 MG tablet Take 1 tablet (40 mg total) by mouth 2 (two) times daily. (Patient taking differently: Take 20-40 mg by mouth 2 (two) times daily. 40MG -AM 20MG -PM) 60 tablet 0 06/28/2017 at Unknown time  . hydrOXYzine (ATARAX/VISTARIL) 25 MG tablet Take 25 mg by mouth 3 (three)  times daily as needed.   Taking  . spironolactone (ALDACTONE) 50 MG tablet Take 1 tablet (50 mg total) by mouth 2 (two) times daily. (Patient not taking: Reported on 06/28/2017) 120 tablet 0 Not Taking at Unknown time    Current medications: Current Facility-Administered Medications  Medication Dose Route Frequency Provider Last Rate Last Dose  . acetaminophen (TYLENOL) tablet 650 mg  650 mg Oral Q6H PRN Hillary Bow, MD       Or  . acetaminophen (TYLENOL) suppository 650 mg  650 mg Rectal Q6H PRN Sudini, Alveta Heimlich, MD      . albuterol (PROVENTIL) (2.5 MG/3ML) 0.083% nebulizer solution 2.5 mg  2.5 mg Nebulization Q2H PRN Hillary Bow, MD   2.5 mg at 06/29/17 2132  . bisacodyl (DULCOLAX) suppository 10 mg  10 mg Rectal Daily PRN Sudini, Alveta Heimlich, MD      . cefTRIAXone (ROCEPHIN) 2 g in sodium chloride 0.9 % 100 mL IVPB  2 g Intravenous Q24H Salary, Montell D, MD 200 mL/hr at 06/30/17 1226 2 g at 06/30/17 1226  . famotidine (PEPCID) IVPB 20 mg premix  20 mg  Intravenous Q12H Jonathon Bellows, MD   Stopped at 06/30/17 [phone]  . ferrous sulfate tablet 325 mg  325 mg Oral TID WC Salary, Montell D, MD   325 mg at 06/30/17 1228  . fluticasone (FLONASE) 50 MCG/ACT nasal spray 2 spray  2 spray Each Nare Daily Salary, Montell D, MD   2 spray at 06/30/17 1101  . furosemide (LASIX) injection 20 mg  20 mg Intravenous Once Salary, Montell D, MD      . furosemide (LASIX) tablet 40 mg  40 mg Oral BID Loney Hering D, MD   40 mg at 06/30/17 0844  . guaiFENesin-dextromethorphan (ROBITUSSIN DM) 100-10 MG/5ML syrup 5 mL  5 mL Oral Q4H PRN Salary, Montell D, MD   5 mL at 06/29/17 2132  . hydrOXYzine (ATARAX/VISTARIL) tablet 25 mg  25 mg Oral TID PRN Hillary Bow, MD      . ondansetron (ZOFRAN) tablet 4 mg  4 mg Oral Q6H PRN Hillary Bow, MD       Or  . ondansetron (ZOFRAN) injection 4 mg  4 mg Intravenous Q6H PRN Sudini, Srikar, MD      . phytonadione (VITAMIN K) SQ injection 10 mg  10 mg Subcutaneous Daily Jonathon Bellows, MD   10 mg at 06/30/17 1108  . polyethylene glycol (MIRALAX / GLYCOLAX) packet 17 g  17 g Oral Daily PRN Sudini, Srikar, MD      . sodium chloride (OCEAN) 0.65 % nasal spray 1 spray  1 spray Each Nare PRN Salary, Montell D, MD      . spironolactone (ALDACTONE) tablet 12.5 mg  12.5 mg Oral BID Salary, Montell D, MD   12.5 mg at 06/30/17 1101      Allergies: Allergies  Allergen Reactions  . Hydrocodone-Acetaminophen Swelling    Caused mouth swelling      Past Medical History: Past Medical History:  Diagnosis Date  . Alcohol abuse   . Alcoholic cirrhosis ([place])   . Alcoholism ([place])   . Cirrhosis ([place])      Past Surgical History: History reviewed. No pertinent surgical history.   Family History: Family History  Problem Relation Age of Onset  . Hypertension Mother   . Diabetes Father   . Cervical cancer Sister   . Cirrhosis Brother      Social History: Social History   Socioeconomic History  . Marital status: Single    Spouse name: Not on file  . Number of children: 1  . Years of education: Not on file  . Highest education level: Not on file  Occupational History  . Not on file  Social Needs  . Financial resource strain: Not on file  . Food insecurity:    Worry: Not on file    Inability: Not on file  . Transportation needs:    Medical: Not on file    Non-medical: Not on file  Tobacco Use  . Smoking status: Never Smoker  . Smokeless tobacco: Never Used  Substance and Sexual Activity  . Alcohol use: No    Comment: quit 2 years   . Drug use: No  . Sexual activity: Not on file  Lifestyle  . Physical activity:    Days per week: Not on file    Minutes per session: Not on file  . Stress: Not on file  Relationships  . Social connections:    Talks on phone: Not on file    Gets together: Not on file    Attends religious service: Not on  file    Active member of club or organization: Not on file    Attends meetings of clubs or organizations: Not on  file    Relationship status: Not on file  . Intimate partner violence:    Fear of current or ex partner: Not on file    Emotionally abused: Not on file    Physically abused: Not on file    Forced sexual activity: Not on file  Other Topics Concern  . Not on file  Social History Narrative  . Not on file     Review of Systems: As per HPI  Vital Signs: Blood pressure 122/67, pulse (!) 105, temperature 98.8 F (37.1 C), temperature source Oral, resp. rate 18, height 5\' 8"  (1.727 m), weight 122.6 kg (270 lb 4.5 oz), SpO2 100 %.  Weight trends: Filed Weights   06/28/17 1437 06/30/17 0459  Weight: 118.8 kg (262 lb) 122.6 kg (270 lb 4.5 oz)    Physical Exam: General: NAD, laying in bed  Head: Normocephalic, atraumatic.  Eyes: Anicteric, EOMI  Nose: Mucous membranes moist, not inflammed, nonerythematous.  Throat: Oropharynx nonerythematous, no exudate appreciated.   Neck: Supple, trachea midline.  Lungs:  Normal respiratory effort. Clear to auscultation BL without crackles or wheezes.  Heart: RRR. S1 and S2 normal without gallop, murmur, or rubs.  Abdomen:  Soft, distended, no tenderness  Extremities: 1+ pretibial edema.  Neurologic: A&O X3, Motor strength is 5/5 in the all 4 extremities  Skin: No visible rashes, scars.    Lab results: Basic Metabolic Panel: Recent Labs  Lab 06/28/17 1450 06/29/17 0434 06/30/17 0507  NA 135 136 137  K 3.4* 4.2 3.9  CL 110 114* 114*  CO2 16* 15* 17*  GLUCOSE 90 98 72  BUN 13 22* 21*  CREATININE 1.17 1.62* 1.28*  CALCIUM 7.2* 7.4* 7.2*    Liver Function Tests: Recent Labs  Lab 06/28/17 1450  AST 47*  ALT 18  ALKPHOS 384*  BILITOT 2.1*  PROT 5.9*  ALBUMIN 2.2*   No results for input(s): LIPASE, AMYLASE in the last 168 hours. No results for input(s): AMMONIA in the last 168 hours.  CBC: Recent Labs  Lab 06/28/17 1450 06/29/17 0434 06/29/17 1921 06/30/17 0507  WBC 7.0 20.8*  --  18.3*  NEUTROABS 6.3  --   --   --    HGB 8.0* 6.9* 8.0* 7.9*  HCT 24.0* 21.6* 24.6* 24.1*  MCV 83.1 86.0  --  84.4  PLT 65* 56*  --  57*    Cardiac Enzymes: No results for input(s): CKTOTAL, CKMB, CKMBINDEX, TROPONINI in the last 168 hours.  BNP: Invalid input(s): POCBNP  CBG: No results for input(s): GLUCAP in the last 168 hours.  Microbiology: Results for orders placed or performed during the hospital encounter of 06/28/17  Culture, blood (Routine x 2)     Status: None (Preliminary result)   Collection Time: 06/28/17  2:50 PM  Result Value Ref Range Status   Specimen Description BLOOD LEFT ANTECUBITAL  Final   Special Requests   Final    BOTTLES DRAWN AEROBIC AND ANAEROBIC Blood Culture results may not be optimal due to an excessive volume of blood received in culture bottles   Culture  Setup Time   Final    GRAM POSITIVE COCCI IN BOTH AEROBIC AND ANAEROBIC BOTTLES CRITICAL RESULT CALLED TO, READ BACK BY AND VERIFIED WITH: DAVID BESANTI AT 6045 06/29/17.PMH Performed at [facility], [address] [place], [place] [address]  Culture GRAM POSITIVE COCCI  Final   Report Status PENDING  Incomplete  Culture, blood (Routine x 2)     Status: None (Preliminary result)   Collection Time: 06/28/17  2:50 PM  Result Value Ref Range Status   Specimen Description BLOOD RIGHT ANTECUBITAL  Final   Special Requests   Final    BOTTLES DRAWN AEROBIC AND ANAEROBIC Blood Culture adequate volume   Culture  Setup Time   Final    Organism ID to follow GRAM POSITIVE COCCI IN BOTH AEROBIC AND ANAEROBIC BOTTLES CRITICAL RESULT CALLED TO, READ BACK BY AND VERIFIED WITH: DAVID BESANTI AT 8099 06/29/17.PMH Performed at [facility], [address] [place], [place] [address]    Culture [facility] POSITIVE COCCI  Final   Report Status PENDING  Incomplete  Blood Culture ID Panel (Reflexed)     Status: Abnormal   Collection Time: 06/28/17  2:50 PM  Result Value Ref Range Status   Enterococcus species NOT DETECTED  NOT DETECTED Final   Listeria monocytogenes NOT DETECTED NOT DETECTED Final   Staphylococcus species NOT DETECTED NOT DETECTED Final   Staphylococcus aureus NOT DETECTED NOT DETECTED Final   Streptococcus species DETECTED (A) NOT DETECTED Final    Comment: CRITICAL RESULT CALLED TO, READ BACK BY AND VERIFIED WITH: DAVID BESANTI AT 5053 06/29/17.PMH    Streptococcus agalactiae DETECTED (A) NOT DETECTED Final    Comment: CRITICAL RESULT CALLED TO, READ BACK BY AND VERIFIED WITH: DAVID BESANTI AT 9767 06/29/17.PMH    Streptococcus pneumoniae NOT DETECTED NOT DETECTED Final   Streptococcus pyogenes NOT DETECTED NOT DETECTED Final   Acinetobacter baumannii NOT DETECTED NOT DETECTED Final   Enterobacteriaceae species NOT DETECTED NOT DETECTED Final   Enterobacter cloacae complex NOT DETECTED NOT DETECTED Final   Escherichia coli NOT DETECTED NOT DETECTED Final   Klebsiella oxytoca NOT DETECTED NOT DETECTED Final   Klebsiella pneumoniae NOT DETECTED NOT DETECTED Final   Proteus species NOT DETECTED NOT DETECTED Final   Serratia marcescens NOT DETECTED NOT DETECTED Final   Haemophilus influenzae NOT DETECTED NOT DETECTED Final   Neisseria meningitidis NOT DETECTED NOT DETECTED Final   Pseudomonas aeruginosa NOT DETECTED NOT DETECTED Final   Candida albicans NOT DETECTED NOT DETECTED Final   Candida glabrata NOT DETECTED NOT DETECTED Final   Candida krusei NOT DETECTED NOT DETECTED Final   Candida parapsilosis NOT DETECTED NOT DETECTED Final   Candida tropicalis NOT DETECTED NOT DETECTED Final    Comment: Performed at [facility], [address], [place], [place] [address]  Urine culture     Status: Abnormal   Collection Time: 06/28/17  6:03 PM  Result Value Ref Range Status   Specimen Description   Final    URINE, RANDOM Performed at [facility], [address], [place], [place] [address]    Special Requests   Final    NONE Performed at [facility], [place], [place] [address]    Culture (A)  Final    <10,000 COLONIES/mL INSIGNIFICANT GROWTH Performed at [facility], [place] [address], [place], [place] [address]    Report Status 06/30/2017 FINAL  Final    Coagulation Studies: Recent Labs    06/28/17 1450 06/30/17 0930  LABPROT 24.7* 29.5*  INR 2.25 2.83    Urinalysis: Recent Labs    06/28/17 1803  COLORURINE AMBER*  LABSPEC 1.020  PHURINE 5.0  GLUCOSEU NEGATIVE  HGBUR NEGATIVE  BILIRUBINUR NEGATIVE  KETONESUR NEGATIVE  PROTEINUR 30*  NITRITE NEGATIVE  LEUKOCYTESUR NEGATIVE      Imaging: Dg Chest 2 View  Result Date: 06/28/2017 CLINICAL DATA:  Patient reports diarrhea, palpitations, dizziness and increased thirst. Patient reports starting a new medication- eplerenone this morning. Patient reports taking 50mg  of the medication at 10 am. Mildly hypertensive. EXAM: CHEST - 2 VIEW COMPARISON:  None. FINDINGS: Heart size is normal. The lungs are free of focal consolidations. There is trace LEFT pleural effusion or pleural thickening. No pulmonary edema. IMPRESSION: 1.  No evidence for acute  abnormality. 2. Trace LEFT pleural effusion versus pleural thickening. Electronically Signed   By: Nolon Nations M.D.   On: 06/28/2017 15:18   US Renal  Result Date: 06/29/2017 CLINICAL DATA:  52 year old male with history of acute kidney injury. EXAM: RENAL / URINARY TRACT ULTRASOUND COMPLETE COMPARISON:  05/28/2017. FINDINGS: Right Kidney: Length: 10.7 cm. Echogenicity within normal limits. No mass or hydronephrosis visualized. Left Kidney: Length: 12.1 cm. Echogenicity within normal limits. No mass or hydronephrosis visualized. Bladder: Appears normal for degree of bladder distention. Other: Moderate amount of ascites. IMPRESSION: 1. No hydronephrosis or other acute findings. 2. Moderate volume of ascites. Electronically Signed   By: Vinnie Langton M.D.   On: 06/29/2017 13:26      Assessment & Plan: Pt is a 52  y.o. male with a PMHX of alcohol-related cirrhosis on liver transplant list, history of ascites was admitted to [facility] on 06/28/2017 with lightheadedness and dizziness after starting eplerenone.  Patient was also febrile upon admission.  Patient found to have streptococcal bacteremia.  1.  Acute renal failure, suspect secondary to sepsis/NSAIDs/diuretics, improving renal ultrasound negative for hydronephrosis. 2.  Lower extremity edema. 3.  Ascites. 4.  Metabolic acidosis.  Plan: Patient developed lightheadedness and dizziness after starting on eplerenone.  However he is found to have streptococcal bacteremia as well.  Suspect that his acute renal failure is multifactorial and sepsis likely contributing.  Patient was felt to be volume overloaded yesterday and was started back on diuretics.  BUN currently down to 21 with a creatinine of 1.28.  Okay to continue diuretics for now given stability of renal function.  Renal ultrasound was performed and was negative for hydronephrosis.  I have advised the patient to avoid NSAIDs at home.  He verbalized understanding of this.  No indication for dialysis at the moment.

## 2017-06-30 NOTE — Progress Notes (Signed)
[place] [facility] [place] NAME: Jordan Hayden    MR#:  [id number]  DATE OF BIRTH:  1965-07-10  SUBJECTIVE:  CHIEF COMPLAINT:   Chief Complaint  Patient presents with  . Medication Reaction  Feeling better to the day, no events overnight per nursing staff, case with gastroenterology-for EGD on tomorrow  REVIEW OF SYSTEMS:  CONSTITUTIONAL: No fever, fatigue or weakness.  EYES: No blurred or double vision.  EARS, NOSE, AND THROAT: No tinnitus or ear pain.  RESPIRATORY: No cough, shortness of breath, wheezing or hemoptysis.  CARDIOVASCULAR: No chest pain, orthopnea, edema.  GASTROINTESTINAL: No nausea, vomiting, diarrhea or abdominal pain.  GENITOURINARY: No dysuria, hematuria.  ENDOCRINE: No polyuria, nocturia,  HEMATOLOGY: No anemia, easy bruising or bleeding SKIN: No rash or lesion. MUSCULOSKELETAL: No joint pain or arthritis.   NEUROLOGIC: No tingling, numbness, weakness.  PSYCHIATRY: No anxiety or depression.   ROS  DRUG ALLERGIES:   Allergies  Allergen Reactions  . Hydrocodone-Acetaminophen Swelling    Caused mouth swelling    VITALS:  Blood pressure 122/67, pulse (!) 105, temperature 98.8 F (37.1 C), temperature source Oral, resp. rate 18, height 5\' 8"  (1.727 m), weight 122.6 kg (270 lb 4.5 oz), SpO2 100 %.  PHYSICAL EXAMINATION:  GENERAL:  52 y.o.-year-old patient lying in the bed with no acute distress.  EYES: Pupils equal, round, reactive to light and accommodation. No scleral icterus. Extraocular muscles intact.  HEENT: Head atraumatic, normocephalic. Oropharynx and nasopharynx clear.  NECK:  Supple, no jugular venous distention. No thyroid enlargement, no tenderness.  LUNGS: Normal breath sounds bilaterally, no wheezing, rales,rhonchi or crepitation. No use of accessory muscles of respiration.  CARDIOVASCULAR: S1, S2 normal. No murmurs, rubs, or gallops.  ABDOMEN: Soft, nontender, nondistended. Bowel sounds present. No  organomegaly or mass.  EXTREMITIES: No pedal edema, cyanosis, or clubbing.  NEUROLOGIC: Cranial nerves II through XII are intact. Muscle strength 5/5 in all extremities. Sensation intact. Gait not checked.  PSYCHIATRIC: The patient is alert and oriented x 3.  SKIN: No obvious rash, lesion, or ulcer.   Physical Exam LABORATORY PANEL:   CBC Recent Labs  Lab 06/30/17 0507  WBC 18.3*  HGB 7.9*  HCT 24.1*  PLT 57*   ------------------------------------------------------------------------------------------------------------------  Chemistries  Recent Labs  Lab 06/28/17 1450  06/30/17 0507  NA 135   < > 137  K 3.4*   < > 3.9  CL 110   < > 114*  CO2 16*   < > 17*  GLUCOSE 90   < > 72  BUN 13   < > 21*  CREATININE 1.17   < > 1.28*  CALCIUM 7.2*   < > 7.2*  AST 47*  --   --   ALT 18  --   --   ALKPHOS 384*  --   --   BILITOT 2.1*  --   --    < > = values in this interval not displayed.   ------------------------------------------------------------------------------------------------------------------  Cardiac Enzymes No results for input(s): TROPONINI in the last 168 hours. ------------------------------------------------------------------------------------------------------------------  RADIOLOGY:  Dg Chest 2 View  Result Date: 06/28/2017 CLINICAL DATA:  Patient reports diarrhea, palpitations, dizziness and increased thirst. Patient reports starting a new medication- eplerenone this morning. Patient reports taking 50mg  of the medication at 10 am. Mildly hypertensive. EXAM: CHEST - 2 VIEW COMPARISON:  None. FINDINGS: Heart size is normal. The lungs are free of focal consolidations. There is trace LEFT pleural effusion or pleural thickening.  No pulmonary edema. IMPRESSION: 1.  No evidence for acute  abnormality. 2. Trace LEFT pleural effusion versus pleural thickening. Electronically Signed   By: Nolon Nations M.D.   On: 06/28/2017 15:18   US Renal  Result Date:  06/29/2017 CLINICAL DATA:  52 year old male with history of acute kidney injury. EXAM: RENAL / URINARY TRACT ULTRASOUND COMPLETE COMPARISON:  05/28/2017. FINDINGS: Right Kidney: Length: 10.7 cm. Echogenicity within normal limits. No mass or hydronephrosis visualized. Left Kidney: Length: 12.1 cm. Echogenicity within normal limits. No mass or hydronephrosis visualized. Bladder: Appears normal for degree of bladder distention. Other: Moderate amount of ascites. IMPRESSION: 1. No hydronephrosis or other acute findings. 2. Moderate volume of ascites. Electronically Signed   By: Vinnie Langton M.D.   On: 06/29/2017 13:26    ASSESSMENT AND PLAN:  *Acute streptococcal sepsis Resolving Exacerbated acute anemia Blood cultures noted for streptococcal species-follow-up on sensitivities/outstanding cultures, discontinue vancomycin/cefepime, start Rocephin   *Acute symptomatic anemia secondary to blood loss and iron deficiency Improved status post blood transfusion Etiology unknown Anemia work-up noted for iron deficiency-received IV iron today, start ferrous sulfate, gastroenterology to do EGD on tomorrow-if negative per gastroenterology will need colonoscopy/capsule study as an outpatient with Dr. Gustavo Lah, CBC daily, and transfuse as needed  *Acute kidney injury Much improved with diuretic therapy and blood transfusion Compounded by symptomatic anemia Suspected Hepatorenal syndrome Continue to avoid nephrotoxic agents, renal ultrasound noted for moderate ascites, nephrology input appreciated  *Chronic Alcoholic liver cirrhosis With associated thrombocytopenia stable Patient has quit alcohol for two years, on [facility] transplant list Continue Lasix and spironolactone   *Chronic thrombocytopenia secondary to alcoholism/liver cirrhosis  Stable CBC daily and transfuse as needed    DVT prophylaxis withSCDs.  All the records are reviewed and case discussed with Care Management/Social  Workerr. Management plans discussed with the patient, family and they are in agreement.  CODE STATUS: full  TOTAL TIME TAKING CARE OF THIS PATIENT: 35 minutes.     POSSIBLE D/C IN 1-3 DAYS, DEPENDING ON CLINICAL CONDITION.   Avel Peace Salary M.D on 06/30/2017   Between 7am to 6pm - Pager - [phone]  After 6pm go to [email or web address] - password [email or web address] [facility]  Office  [phone]  CC: Primary care physician; Damyan Body, MD  Note: This dictation was prepared with Dragon dictation along with smaller phrase technology. Any transcriptional errors that result from this process are unintentional.

## 2017-06-30 NOTE — Progress Notes (Signed)
Jonathon Bellows , MD [address], [place], [place], [place], [address] [address], [place], [place], [place], [address] Phone: [phone]  Fax: [phone]   Jordan Hayden is being followed for anemia  Day 1 of follow up   Subjective: Normal bowel movemnets, feels much better    Objective: Vital signs in last 24 hours: Vitals:   06/29/17 1536 06/29/17 1744 06/29/17 2052 06/30/17 0459  BP: (!) 109/55 108/60 (!) 105/56 122/67  Pulse: (!) 107 (!) 105 100 (!) 105  Resp: (!) 22 20 20 18   Temp: 98.5 F (36.9 C) 98.4 F (36.9 C) 98.7 F (37.1 C) 98.8 F (37.1 C)  TempSrc: Oral Oral Oral Oral  SpO2: 100% 100% 100% 100%  Weight:    270 lb 4.5 oz (122.6 kg)  Height:       Weight change: 8 lb 4.5 oz (3.757 kg)  Intake/Output Summary (Last 24 hours) at 06/30/2017 1017 Last data filed at 06/30/2017 5102 Gross per 24 hour  Intake 1370 ml  Output 650 ml  Net 720 ml     Exam: Heart:: Regular rate and rhythm, S1S2 present or without murmur or extra heart sounds Lungs: normal, clear to auscultation and clear to auscultation and percussion Abdomen: soft, nontender, normal bowel sounds   Lab Results: @LABTEST2 @ Micro Results: Recent Results (from the past 240 hour(s))  Culture, blood (Routine x 2)     Status: None (Preliminary result)   Collection Time: 06/28/17  2:50 PM  Result Value Ref Range Status   Specimen Description BLOOD LEFT ANTECUBITAL  Final   Special Requests   Final    BOTTLES DRAWN AEROBIC AND ANAEROBIC Blood Culture results may not be optimal due to an excessive volume of blood received in culture bottles   Culture  Setup Time   Final    GRAM POSITIVE COCCI IN BOTH AEROBIC AND ANAEROBIC BOTTLES CRITICAL RESULT CALLED TO, READ BACK BY AND VERIFIED WITH: DAVID BESANTI AT 5852 06/29/17.PMH Performed at [facility], [address] [place], [place] [address]    Culture [facility] POSITIVE COCCI  Final   Report Status PENDING  Incomplete  Culture,  blood (Routine x 2)     Status: None (Preliminary result)   Collection Time: 06/28/17  2:50 PM  Result Value Ref Range Status   Specimen Description BLOOD RIGHT ANTECUBITAL  Final   Special Requests   Final    BOTTLES DRAWN AEROBIC AND ANAEROBIC Blood Culture adequate volume   Culture  Setup Time   Final    Organism ID to follow GRAM POSITIVE COCCI IN BOTH AEROBIC AND ANAEROBIC BOTTLES CRITICAL RESULT CALLED TO, READ BACK BY AND VERIFIED WITH: DAVID BESANTI AT 2353 06/29/17.PMH Performed at [facility], [address] [place], [place] [address]    Culture [facility] POSITIVE COCCI  Final   Report Status PENDING  Incomplete  Blood Culture ID Panel (Reflexed)     Status: Abnormal   Collection Time: 06/28/17  2:50 PM  Result Value Ref Range Status   Enterococcus species NOT DETECTED NOT DETECTED Final   Listeria monocytogenes NOT DETECTED NOT DETECTED Final   Staphylococcus species NOT DETECTED NOT DETECTED Final   Staphylococcus aureus NOT DETECTED NOT DETECTED Final   Streptococcus species DETECTED (A) NOT DETECTED Final    Comment: CRITICAL RESULT CALLED TO, READ BACK BY AND VERIFIED WITH: DAVID BESANTI AT 1540 06/29/17.PMH    Streptococcus agalactiae DETECTED (A) NOT DETECTED Final    Comment: CRITICAL RESULT CALLED  TO, READ BACK BY AND VERIFIED WITH: DAVID BESANTI AT 0054 06/29/17.PMH    Streptococcus pneumoniae NOT DETECTED NOT DETECTED Final   Streptococcus pyogenes NOT DETECTED NOT DETECTED Final   Acinetobacter baumannii NOT DETECTED NOT DETECTED Final   Enterobacteriaceae species NOT DETECTED NOT DETECTED Final   Enterobacter cloacae complex NOT DETECTED NOT DETECTED Final   Escherichia coli NOT DETECTED NOT DETECTED Final   Klebsiella oxytoca NOT DETECTED NOT DETECTED Final   Klebsiella pneumoniae NOT DETECTED NOT DETECTED Final   Proteus species NOT DETECTED NOT DETECTED Final   Serratia marcescens NOT DETECTED NOT DETECTED Final   Haemophilus influenzae NOT  DETECTED NOT DETECTED Final   Neisseria meningitidis NOT DETECTED NOT DETECTED Final   Pseudomonas aeruginosa NOT DETECTED NOT DETECTED Final   Candida albicans NOT DETECTED NOT DETECTED Final   Candida glabrata NOT DETECTED NOT DETECTED Final   Candida krusei NOT DETECTED NOT DETECTED Final   Candida parapsilosis NOT DETECTED NOT DETECTED Final   Candida tropicalis NOT DETECTED NOT DETECTED Final    Comment: Performed at [facility], [address] [place], [place] [address]   Studies/Results: Dg Chest 2 View  Result Date: 06/28/2017 CLINICAL DATA:  Patient reports diarrhea, palpitations, dizziness and increased thirst. Patient reports starting a new medication- eplerenone this morning. Patient reports taking 50mg  of the medication at 10 am. Mildly hypertensive. EXAM: CHEST - 2 VIEW COMPARISON:  None. FINDINGS: Heart size is normal. The lungs are free of focal consolidations. There is trace LEFT pleural effusion or pleural thickening. No pulmonary edema. IMPRESSION: 1.  No evidence for acute  abnormality. 2. Trace LEFT pleural effusion versus pleural thickening. Electronically Signed   By: Nolon Nations M.D.   On: 06/28/2017 15:18   US Renal  Result Date: 06/29/2017 CLINICAL DATA:  52 year old male with history of acute kidney injury. EXAM: RENAL / URINARY TRACT ULTRASOUND COMPLETE COMPARISON:  05/28/2017. FINDINGS: Right Kidney: Length: 10.7 cm. Echogenicity within normal limits. No mass or hydronephrosis visualized. Left Kidney: Length: 12.1 cm. Echogenicity within normal limits. No mass or hydronephrosis visualized. Bladder: Appears normal for degree of bladder distention. Other: Moderate amount of ascites. IMPRESSION: 1. No hydronephrosis or other acute findings. 2. Moderate volume of ascites. Electronically Signed   By: Vinnie Langton M.D.   On: 06/29/2017 13:26   Medications: I have reviewed the patient's current medications. Scheduled Meds: . ferrous sulfate  325 mg Oral  TID WC  . fluticasone  2 spray Each Nare Daily  . furosemide  20 mg Intravenous Once  . furosemide  40 mg Oral BID  . phytonadione  10 mg Subcutaneous Daily  . spironolactone  12.5 mg Oral BID   Continuous Infusions: . ceFEPime (MAXIPIME) IV 2 g (06/30/17 0617)  . famotidine (PEPCID) IV Stopped (06/30/17 0617)  . iron dextran (INFED/DEXFERRUM) infusion 100 mg (06/30/17 0833)  . vancomycin Stopped (06/29/17 2340)   PRN Meds:.acetaminophen **OR** acetaminophen, albuterol, bisacodyl, guaiFENesin-dextromethorphan, hydrOXYzine, ondansetron **OR** ondansetron (ZOFRAN) IV, polyethylene glycol, sodium chloride   Assessment: Active Problems:   Sepsis (HCC)  Jordan Hayden is a 52 y.o. y/o male with decompensated alcoholic liver cirrhosis with ascites.  Follows with Dr. Gustavo Lah at [facility].  Review of past office notes suggests that an EGD has not been performed for unknown reasons despite history of cirrhosis .  He has been followed with them since early 2018.  Admitted with sepsis.  We have been consulted for an anemia with no overt GI bleeding.  Noted his  INR is elevated at 2.2 for over a year( likely secondary to liver disease).  He follows at [facility] for transplant evaluation.  With no overt GI bleeding this makes variceal bleeding less likely.  There is no significant elevation in the BUN/creatinine ratio also suggesting less likelihood of an upper GI bleed.  Low iron levels suggest iron deficiency anemia. Long term NSAID use.   Plan   1.  IV PPI  2. Monitor CBC and transfuse  3. He is already on antibiotics.   4. Subcutaneous vitamin K 10 mg once daily for 3 days.  6.  It is very likely that his creatinine is elevated more from AKI+/- NSAID 7.  Low iron levels consider IV iron 8. EGD tomorrow to r/o ulcers secondary to NSAID's and at the same time will screen for varices. R/o any cause for iron deficiency anemia.  9. If EGD is negative outpatient colonoscopy +/- capsule study to  evaluate for iron deficiency anemia with Dr Gustavo Lah   I have discussed alternative options, risks & benefits,  which include, but are not limited to, bleeding, infection, perforation,respiratory complication & drug reaction.  The patient agrees with this plan & written consent will be obtained.       LOS: 2 days   Jonathon Bellows, MD 06/30/2017, 9:07 AM

## 2017-07-01 ENCOUNTER — Inpatient Hospital Stay: Payer: Medicaid Other | Admitting: Anesthesiology

## 2017-07-01 ENCOUNTER — Encounter: Payer: Self-pay | Admitting: Anesthesiology

## 2017-07-01 ENCOUNTER — Encounter
Admission: EM | Disposition: A | Discharge status: Home / Self Care | Payer: Self-pay | Source: Home / Self Care | Attending: Family Medicine

## 2017-07-01 DIAGNOSIS — K2970 Gastritis, unspecified, without bleeding: Secondary | ICD-10-CM

## 2017-07-01 DIAGNOSIS — I8500 Esophageal varices without bleeding: Secondary | ICD-10-CM

## 2017-07-01 HISTORY — PX: ESOPHAGOGASTRODUODENOSCOPY (EGD) WITH PROPOFOL: SHX5813

## 2017-07-01 LAB — CULTURE, BLOOD (ROUTINE X 2): SPECIAL REQUESTS: ADEQUATE

## 2017-07-01 LAB — BASIC METABOLIC PANEL
ANION GAP: 3 — AB (ref 5–15)
BUN: 12 mg/dL (ref 6–20)
CALCIUM: 7 mg/dL — AB (ref 8.9–10.3)
CO2: 21 mmol/L — AB (ref 22–32)
CREATININE: 0.97 mg/dL (ref 0.61–1.24)
Chloride: 112 mmol/L — ABNORMAL HIGH (ref 101–111)
GFR calc Af Amer: >60 mL/min (ref >60)
GFR calc non Af Amer: >60 mL/min (ref >60)
GLUCOSE: 78 mg/dL (ref 65–99)
Potassium: 3.8 mmol/L (ref 3.5–5.1)
Sodium: 136 mmol/L (ref 135–145)

## 2017-07-01 LAB — HIV ANTIBODY (ROUTINE TESTING W REFLEX): HIV SCREEN 4TH GENERATION: NONREACTIVE

## 2017-07-01 LAB — CBC
HEMATOCRIT: 24 % — AB (ref 40.0–52.0)
Hemoglobin: 7.8 g/dL — ABNORMAL LOW (ref 13.0–18.0)
MCH: 27.4 pg (ref 26.0–34.0)
MCHC: 32.5 g/dL (ref 32.0–36.0)
MCV: 84.2 fL (ref 80.0–100.0)
Platelets: 62 10*3/uL — ABNORMAL LOW (ref 150–440)
RBC: 2.85 MIL/uL — ABNORMAL LOW (ref 4.40–5.90)
RDW: 17.1 % — AB (ref 11.5–14.5)
WBC: 15.4 10*3/uL — ABNORMAL HIGH (ref 3.8–10.6)

## 2017-07-01 LAB — PROTIME-INR
INR: 1.92
PROTHROMBIN TIME: 21.8 s — AB (ref 11.4–15.2)

## 2017-07-01 SURGERY — ESOPHAGOGASTRODUODENOSCOPY (EGD) WITH PROPOFOL
Anesthesia: General

## 2017-07-01 MED ORDER — SODIUM CHLORIDE 0.9 % IV SOLN
INTRAVENOUS | Status: DC
  Administered 2017-07-01: 07:00:00 via INTRAVENOUS

## 2017-07-01 MED ORDER — IPRATROPIUM-ALBUTEROL 0.5-2.5 (3) MG/3ML IN SOLN
RESPIRATORY_TRACT | Status: AC
  Administered 2017-07-01: 3 mL
  Filled 2017-07-01: qty 3

## 2017-07-01 MED ORDER — AMPICILLIN 500 MG PO CAPS
500.0000 mg | ORAL_CAPSULE | Freq: Three times a day (TID) | ORAL | Status: DC
  Administered 2017-07-01 – 2017-07-02 (×3): 500 mg via ORAL
  Filled 2017-07-01 (×5): qty 1

## 2017-07-01 MED ORDER — PROPOFOL 10 MG/ML IV BOLUS
INTRAVENOUS | Status: AC
  Filled 2017-07-01: qty 40

## 2017-07-01 MED ORDER — IPRATROPIUM-ALBUTEROL 0.5-2.5 (3) MG/3ML IN SOLN
3.0000 mL | Freq: Four times a day (QID) | RESPIRATORY_TRACT | Status: DC
  Administered 2017-07-01 – 2017-07-02 (×3): 3 mL via RESPIRATORY_TRACT
  Filled 2017-07-01 (×3): qty 3

## 2017-07-01 MED ORDER — PROPOFOL 10 MG/ML IV BOLUS
INTRAVENOUS | Status: DC | PRN
  Administered 2017-07-01: 70 mg via INTRAVENOUS
  Administered 2017-07-01: 40 mg via INTRAVENOUS

## 2017-07-01 MED ORDER — LIDOCAINE HCL (CARDIAC) PF 100 MG/5ML IV SOSY
PREFILLED_SYRINGE | INTRAVENOUS | Status: DC | PRN
  Administered 2017-07-01: 80 mg via INTRAVENOUS

## 2017-07-01 NOTE — Anesthesia Post-op Follow-up Note (Signed)
Anesthesia QCDR form completed.        

## 2017-07-01 NOTE — Progress Notes (Signed)
[facility] [place] NAME: Jordan Hayden    MR#:  [id number]  DATE OF BIRTH:  01/31/1965  SUBJECTIVE:  CHIEF COMPLAINT:   Chief Complaint  Patient presents with  . Medication Reaction  Patient feeling slightly better, EGD noted, noted relative hypotension and worsening anemia, will ambulate today, placed on fall precautions, check orthostatics, CBC in the morning to ensure stability  REVIEW OF SYSTEMS:  CONSTITUTIONAL: No fever, fatigue or weakness.  EYES: No blurred or double vision.  EARS, NOSE, AND THROAT: No tinnitus or ear pain.  RESPIRATORY: No cough, shortness of breath, wheezing or hemoptysis.  CARDIOVASCULAR: No chest pain, orthopnea, edema.  GASTROINTESTINAL: No nausea, vomiting, diarrhea or abdominal pain.  GENITOURINARY: No dysuria, hematuria.  ENDOCRINE: No polyuria, nocturia,  HEMATOLOGY: No anemia, easy bruising or bleeding SKIN: No rash or lesion. MUSCULOSKELETAL: No joint pain or arthritis.   NEUROLOGIC: No tingling, numbness, weakness.  PSYCHIATRY: No anxiety or depression.   ROS  DRUG ALLERGIES:   Allergies  Allergen Reactions  . Hydrocodone-Acetaminophen Swelling    Caused mouth swelling    VITALS:  Blood pressure 97/68, pulse 88, temperature 98.4 F (36.9 C), temperature source Oral, resp. rate (!) 21, height 5\' 8"  (1.727 m), weight 122.6 kg (270 lb 4.5 oz), SpO2 100 %.  PHYSICAL EXAMINATION:  GENERAL:  52 y.o.-year-old patient lying in the bed with no acute distress.  EYES: Pupils equal, round, reactive to light and accommodation. No scleral icterus. Extraocular muscles intact.  HEENT: Head atraumatic, normocephalic. Oropharynx and nasopharynx clear.  NECK:  Supple, no jugular venous distention. No thyroid enlargement, no tenderness.  LUNGS: Normal breath sounds bilaterally, no wheezing, rales,rhonchi or crepitation. No use of accessory muscles of respiration.  CARDIOVASCULAR: S1, S2 normal. No murmurs,  rubs, or gallops.  ABDOMEN: Soft, nontender, nondistended. Bowel sounds present. No organomegaly or mass.  EXTREMITIES: No pedal edema, cyanosis, or clubbing.  NEUROLOGIC: Cranial nerves II through XII are intact. Muscle strength 5/5 in all extremities. Sensation intact. Gait not checked.  PSYCHIATRIC: The patient is alert and oriented x 3.  SKIN: No obvious rash, lesion, or ulcer.   Physical Exam LABORATORY PANEL:   CBC Recent Labs  Lab 07/01/17 0453  WBC 15.4*  HGB 7.8*  HCT 24.0*  PLT 62*   ------------------------------------------------------------------------------------------------------------------  Chemistries  Recent Labs  Lab 06/28/17 1450  07/01/17 0453  NA 135   < > 136  K 3.4*   < > 3.8  CL 110   < > 112*  CO2 16*   < > 21*  GLUCOSE 90   < > 78  BUN 13   < > 12  CREATININE 1.17   < > 0.97  CALCIUM 7.2*   < > 7.0*  AST 47*  --   --   ALT 18  --   --   ALKPHOS 384*  --   --   BILITOT 2.1*  --   --    < > = values in this interval not displayed.   ------------------------------------------------------------------------------------------------------------------  Cardiac Enzymes No results for input(s): TROPONINI in the last 168 hours. ------------------------------------------------------------------------------------------------------------------  RADIOLOGY:  No results found.  ASSESSMENT AND PLAN:  *Acute group B streptococcal sepsis Resolving Exacerbated acute anemia Blood cultures noted for group B strep-discontinue Rocephin, start ampicillin to complete antibiotic course for another 4 days    *Acute symptomatic anemia secondary to blood loss and iron deficiency Stable status post blood transfusion Anemia work-up noted for iron  deficiency-received IV iron,continue ferrous sulfate, status post EGD done July 01, 2017 noted for small hiatal hernia/gastritis/stage I varices-outpatient follow-up with gastroenterology recommended status post  discharge-followed by Dr. Gustavo Lah,  relative hypotension noted, will ambulate today, check orthostatics, fall precautions, CBC in the morning to ensure stability versus repeat blood transfusion, continue close medical monitoring   *Acute kidney injury Resolved Compounded by symptomatic anemia Continue to avoid nephrotoxic agents, renal ultrasound noted for moderate ascites, nephrology did see patient while in house, renal function greatly improved with diuretic medications  *ChronicAlcoholic liver cirrhosis With associated thrombocytopenia stable Patient has quit alcohol for two years, on [facility] transplant list Continue Lasix and spironolactone  *Chronic thrombocytopenia secondary to alcoholism/liver cirrhosis  Stable CBC daily and transfuse as needed   DVT prophylaxis withSCDs. Disposition to home on tomorrow if no signs or symptoms of symptomatic anemia, hemoglobin remains stable   All the records are reviewed and case discussed with Care Management/Social Workerr. Management plans discussed with the patient, family and they are in agreement.  CODE STATUS: full  TOTAL TIME TAKING CARE OF THIS PATIENT: 35 minutes.     POSSIBLE D/C IN 1-2 DAYS, DEPENDING ON CLINICAL CONDITION.   Avel Peace Salary M.D on 07/01/2017   Between 7am to 6pm - Pager - [phone]  After 6pm go to [email or web address] - password [email or web address] [facility]  Office  [phone]  CC: Primary care physician; Bryce Body, MD  Note: This dictation was prepared with Dragon dictation along with smaller phrase technology. Any transcriptional errors that result from this process are unintentional.

## 2017-07-01 NOTE — Transfer of Care (Signed)
Immediate Anesthesia Transfer of Care Note  Patient: Jordan Hayden  Procedure(s) Performed: ESOPHAGOGASTRODUODENOSCOPY (EGD) WITH PROPOFOL (N/A )  Patient Location: PACU  Anesthesia Type:General  Level of Consciousness: awake, alert  and oriented  Airway & Oxygen Therapy: Patient Spontanous Breathing and Patient connected to nasal cannula oxygen  Post-op Assessment: Report given to RN and Post -op Vital signs reviewed and stable  Post vital signs: Reviewed and stable  Last Vitals:  Vitals Value Taken Time  BP 99/53 07/01/2017  1:03 PM  Temp    Pulse 92 07/01/2017  1:03 PM  Resp 28 07/01/2017  1:03 PM  SpO2 97 % 07/01/2017  1:03 PM  Vitals shown include unvalidated device data.  Last Pain:  Vitals:   07/01/17 0819  TempSrc:   PainSc: 0-No pain         Complications: No apparent anesthesia complications

## 2017-07-01 NOTE — Anesthesia Preprocedure Evaluation (Addendum)
Anesthesia Evaluation  Patient identified by MRN, date of birth, ID band Patient awake    Reviewed: Allergy & Precautions, NPO status , Patient's Chart, lab work & pertinent test results, reviewed documented beta blocker date and time   Airway Mallampati: IV  TM Distance: >3 FB     Dental  (+) Chipped   Pulmonary           Cardiovascular      Neuro/Psych PSYCHIATRIC DISORDERS Anxiety    GI/Hepatic   Endo/Other    Renal/GU      Musculoskeletal   Abdominal   Peds  Hematology  (+) anemia ,   Anesthesia Other Findings Obese. Edema. ETOH. plts 62000. Hb 7.8.  Reproductive/Obstetrics                            Anesthesia Physical Anesthesia Plan  ASA: III  Anesthesia Plan: General   Post-op Pain Management:    Induction: Intravenous  PONV Risk Score and Plan:   Airway Management Planned:   Additional Equipment:   Intra-op Plan:   Post-operative Plan:   Informed Consent: I have reviewed the patients History and Physical, chart, labs and discussed the procedure including the risks, benefits and alternatives for the proposed anesthesia with the patient or authorized representative who has indicated his/her understanding and acceptance.     Plan Discussed with: CRNA  Anesthesia Plan Comments:        Anesthesia Quick Evaluation

## 2017-07-01 NOTE — Op Note (Signed)
[facility] Gastroenterology Patient Name: Jordan Hayden Procedure Date: 07/01/2017 12:48 PM MRN: [id number] Account #: [id number] Date of Birth: May 11, 1965 Admit Type: Inpatient Age: 52 Room: [facility] ENDO ROOM 2 Gender: Male Note Status: Finalized Procedure:            Upper GI endoscopy Indications:          Suspected upper gastrointestinal bleeding Providers:            Lucilla Lame MD, MD Referring MD:         Jordan Hayden (Referring MD) Medicines:            Propofol per Anesthesia Complications:        No immediate complications. Procedure:            Pre-Anesthesia Assessment:                       - Prior to the procedure, a History and Physical was                        performed, and patient medications and allergies were                        reviewed. The patient's tolerance of previous                        anesthesia was also reviewed. The risks and benefits of                        the procedure and the sedation options and risks were                        discussed with the patient. All questions were                        answered, and informed consent was obtained. Prior                        Anticoagulants: The patient has taken no previous                        anticoagulant or antiplatelet agents. ASA Grade                        Assessment: III - A patient with severe systemic                        disease. After reviewing the risks and benefits, the                        patient was deemed in satisfactory condition to undergo                        the procedure.                       After obtaining informed consent, the endoscope was                        passed under direct vision. Throughout the procedure,  the patient's blood pressure, pulse, and oxygen                        saturations were monitored continuously. The Endoscope                        was introduced through the mouth, and advanced to  the                        second part of duodenum. The upper GI endoscopy was                        accomplished without difficulty. The patient tolerated                        the procedure well. Findings:      A small hiatal hernia was present.      Grade I varices were found in the lower third of the esophagus.      Localized mild inflammation characterized by adherent blood was found in       the gastric Hayden.      The examined duodenum was normal. Impression:           - Small hiatal hernia.                       - Grade I esophageal varices.                       - Gastritis.                       - Normal examined duodenum.                       - No specimens collected. Recommendation:       - Return patient to hospital ward for ongoing care.                       - Resume regular diet.                       - Continue present medications.                       - The patient can have further Gi work up as an                        outpatient. Procedure Code(s):    --- Professional ---                       [phone], Esophagogastroduodenoscopy, flexible, transoral;                        diagnostic, including collection of specimen(s) by                        brushing or washing, when performed (separate procedure) Diagnosis Code(s):    --- Professional ---                       I85.00, Esophageal varices without bleeding  K29.70, Gastritis, unspecified, without bleeding CPT copyright 2017 American Medical Association. All rights reserved. The codes documented in this report are preliminary and upon coder review may  be revised to meet current compliance requirements. Lucilla Lame MD, MD 07/01/2017 12:59:22 PM This report has been signed electronically. Number of Addenda: 0 Note Initiated On: 07/01/2017 12:48 PM      [facility]

## 2017-07-01 NOTE — Anesthesia Postprocedure Evaluation (Signed)
Anesthesia Post Note  Patient: Jordan Hayden  Procedure(s) Performed: ESOPHAGOGASTRODUODENOSCOPY (EGD) WITH PROPOFOL (N/A )  Patient location during evaluation: Endoscopy Anesthesia Type: General Level of consciousness: awake and alert Pain management: pain level controlled Vital Signs Assessment: post-procedure vital signs reviewed and stable Respiratory status: spontaneous breathing, nonlabored ventilation, respiratory function stable and patient connected to nasal cannula oxygen Cardiovascular status: blood pressure returned to baseline and stable Postop Assessment: no apparent nausea or vomiting Anesthetic complications: no     Last Vitals:  Vitals:   07/01/17 1325 07/01/17 1335  BP: 102/88 97/68  Pulse: 88 88  Resp: 18 (!) 21  Temp:    SpO2: 100% 100%    Last Pain:  Vitals:   07/01/17 1335  TempSrc:   PainSc: 0-No pain                 Sharisse Rantz S

## 2017-07-02 LAB — CBC
HCT: 23.8 % — ABNORMAL LOW (ref 40.0–52.0)
HEMOGLOBIN: 7.9 g/dL — AB (ref 13.0–18.0)
MCH: 27.8 pg (ref 26.0–34.0)
MCHC: 33.2 g/dL (ref 32.0–36.0)
MCV: 83.8 fL (ref 80.0–100.0)
Platelets: 69 10*3/uL — ABNORMAL LOW (ref 150–440)
RBC: 2.83 MIL/uL — AB (ref 4.40–5.90)
RDW: 17.1 % — ABNORMAL HIGH (ref 11.5–14.5)
WBC: 11.2 10*3/uL — ABNORMAL HIGH (ref 3.8–10.6)

## 2017-07-02 LAB — BASIC METABOLIC PANEL
Anion gap: 4 — ABNORMAL LOW (ref 5–15)
BUN: 10 mg/dL (ref 6–20)
CHLORIDE: 109 mmol/L (ref 98–111)
CO2: 22 mmol/L (ref 22–32)
CREATININE: 0.87 mg/dL (ref 0.61–1.24)
Calcium: 7 mg/dL — ABNORMAL LOW (ref 8.9–10.3)
GFR calc non Af Amer: >60 mL/min (ref >60)
Glucose, Bld: 74 mg/dL (ref 70–99)
Potassium: 3.3 mmol/L — ABNORMAL LOW (ref 3.5–5.1)
SODIUM: 135 mmol/L (ref 135–145)

## 2017-07-02 MED ORDER — POTASSIUM CHLORIDE CRYS ER 20 MEQ PO TBCR: 40.0000 meq | EXTENDED_RELEASE_TABLET | Freq: Two times a day (BID) | ORAL | 0 refills | Status: AC

## 2017-07-02 MED ORDER — PANTOPRAZOLE SODIUM 40 MG PO TBEC: 40.0000 mg | DELAYED_RELEASE_TABLET | Freq: Every day | ORAL | 0 refills | Status: AC

## 2017-07-02 MED ORDER — POTASSIUM CHLORIDE CRYS ER 20 MEQ PO TBCR
EXTENDED_RELEASE_TABLET | ORAL | Status: AC
  Filled 2017-07-02: qty 2

## 2017-07-02 MED ORDER — FERROUS SULFATE 325 (65 FE) MG PO TABS: 325.0000 mg | ORAL_TABLET | Freq: Three times a day (TID) | ORAL | 0 refills | Status: DC

## 2017-07-02 MED ORDER — SALINE SPRAY 0.65 % NA SOLN: 1.0000 | NASAL | 3 refills | Status: DC | PRN

## 2017-07-02 MED ORDER — DOCUSATE SODIUM 250 MG PO CAPS: 100.0000 mg | ORAL_CAPSULE | Freq: Every day | ORAL | 2 refills | Status: DC | PRN

## 2017-07-02 MED ORDER — POTASSIUM CHLORIDE CRYS ER 20 MEQ PO TBCR
40.0000 meq | EXTENDED_RELEASE_TABLET | Freq: Two times a day (BID) | ORAL | Status: DC
  Administered 2017-07-02: 40 meq via ORAL
  Filled 2017-07-02: qty 2

## 2017-07-02 MED ORDER — FLUTICASONE PROPIONATE 50 MCG/ACT NA SUSP: 2.0000 | Freq: Every day | NASAL | 2 refills | Status: DC

## 2017-07-02 MED ORDER — SPIRONOLACTONE 25 MG PO TABS: 12.5000 mg | ORAL_TABLET | Freq: Two times a day (BID) | ORAL | 0 refills | Status: AC

## 2017-07-02 MED ORDER — FUROSEMIDE 20 MG PO TABS: 20.0000 mg | ORAL_TABLET | Freq: Every day | ORAL | 0 refills | Status: AC

## 2017-07-02 MED ORDER — AMPICILLIN 500 MG PO CAPS: 500.0000 mg | ORAL_CAPSULE | Freq: Three times a day (TID) | ORAL | 0 refills | Status: DC

## 2017-07-02 NOTE — Discharge Summary (Signed)
[facility] NAME: Jordan Hayden    MR#:  [id number]  DATE OF BIRTH:  1965-11-25  DATE OF ADMISSION:  06/28/2017 ADMITTING PHYSICIAN: Hillary Bow, MD  DATE OF DISCHARGE: No discharge date for patient encounter.  PRIMARY CARE PHYSICIAN: Royden Body, MD    ADMISSION DIAGNOSIS:  Sepsis, due to unspecified organism ([place]) [A41.9] Fever, unspecified fever cause [R50.9]  DISCHARGE DIAGNOSIS:  Active Problems:   Sepsis ([place])   SECONDARY DIAGNOSIS:   Past Medical History:  Diagnosis Date  . Alcohol abuse   . Alcoholic cirrhosis ([place])   . Alcoholism ([place])   . Cirrhosis [facility])     HOSPITAL COURSE:   *Acute group B streptococcal sepsis Resolved Exacerbatedacuteanemia Blood cultures noted for group B strep-treated initially with Rocephin/sensitivities noted, continue ampicillin to complete antibiotic course   *Acute symptomatic anemiasecondary to blood loss and iron deficiency Stable status post blood transfusion Anemia work-up noted for iron deficiency-received IV iron,continue ferrous sulfate, status post EGD done July 01, 2017 noted for small hiatal hernia/gastritis/stage I varices-outpatient follow-up with gastroenterology recommended status post discharge-followed by Dr. 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 Hydrocodone-Acetaminophen Swelling    Caused mouth swelling    DISCHARGE MEDICATIONS:   Allergies as of 07/02/2017      Reactions   Hydrocodone-acetaminophen Swelling   Caused mouth swelling      Medication List    STOP taking these medications   aspirin EC 81 MG tablet     TAKE these medications   ampicillin 500 MG capsule Commonly known as:  PRINCIPEN Take 1 capsule (500 mg total) by mouth every 8 (eight) hours.   docusate sodium 250 MG capsule Commonly known as:  COLACE Take 1 capsule (250 mg total) by mouth daily as needed (constipation).   eplerenone 50 MG tablet Commonly known as:  INSPRA Take 1 tablet by mouth daily.   ergocalciferol 50000 units capsule Commonly known as:  VITAMIN D2 Take 1 capsule (50,000 Units total) by mouth once a week.   ferrous sulfate 325 (65 FE) MG tablet Take 1 tablet (325 mg total) by mouth 3 (three) times daily with meals.   fluticasone 50 MCG/ACT nasal spray Commonly known as:  FLONASE Place 2 sprays into both nostrils daily.   furosemide 20 MG tablet Commonly known as:  LASIX Take 1 tablet (20 mg total) by mouth daily. What changed:    medication strength  how much to take  when to take this   hydrOXYzine 25 MG tablet Commonly known as:  ATARAX/VISTARIL Take 25 mg by mouth 3 (three) times daily as needed.   pantoprazole 40 MG tablet Commonly known as:  PROTONIX Take 1 tablet (40 mg total) by mouth daily.   potassium chloride SA 20 MEQ tablet Commonly known as:  K-DUR,KLOR-CON Take 2 tablets (40 mEq total) by mouth 2 (two) times daily.   sodium chloride 0.65 % Soln nasal spray Commonly known  as:  OCEAN Place 1 spray into both nostrils as needed for congestion.   spironolactone 25 MG tablet Commonly known as:  ALDACTONE Take 0.5 tablets (12.5 mg total) by mouth 2 (two) times daily. What changed:    medication strength  how much to take         DISCHARGE INSTRUCTIONS:  If you experience worsening of your admission symptoms, develop shortness of breath, life threatening emergency, suicidal or homicidal thoughts you must seek medical attention immediately by calling 911 or calling your MD immediately  if symptoms less severe.  You Must read complete instructions/literature along with all the possible adverse reactions/side effects for all the Medicines you take and that have been prescribed to you. Take any new Medicines after you have completely understood and accept all the possible adverse reactions/side effects.   Please note  You were cared for by a hospitalist during your hospital stay. If you have any questions about your discharge medications or the care you received while you were in the hospital after you are discharged, you can call the unit and asked to speak with the hospitalist on call if the hospitalist that took care of you is not available. Once you are discharged, your primary care physician will handle any further medical issues. Please note that NO REFILLS for any discharge medications will be authorized once you are discharged, as it is imperative that you return to your primary care physician (or establish a relationship with a primary care physician if you do not have one) for your aftercare needs so that they can reassess your need for medications and monitor your lab values.    Today   CHIEF COMPLAINT:   Chief Complaint  Patient presents with  . Medication Reaction    HISTORY OF PRESENT ILLNESS:   52 y.o. male with a known history of alcoholic cirrhosis on liver transplant list presented to the hospital with feeling lightheaded and dizzy. Patient was found to be hypotensive and systolic blood pressure in the 80s. He has started a new medication  eplerenone with first dose today morning after which he felt bad and dizzy. On arrival the emergency room he was found to have a fever of 102.6 and hypertension.  Code sepsis activated and patient was fluid resuscitated. Lactic acid elevated at 3.9. Chest x-ray clear. Urinalysis normal. No neck pain. No abdominal pain or diarrhea. No rash. No recent antibiotic use. Patient is being admitted for sepsis with unknown source. Vancomycin and Zosyn given the emergency room. A paracentesis was attempted recently in [facility] and no ascites found. Patient feels his abdomen is the same size as before.  VITAL SIGNS:  Blood pressure 117/65, pulse 91, temperature 98.4 F (36.9 C), temperature source Oral, resp. rate 20, height 5\' 8"  (1.727 m), weight 118.2 kg (260 lb 9.3 oz), SpO2 99 %.  I/O:    Intake/Output Summary (Last 24 hours) at 07/02/2017 1000 Last data filed at 07/02/2017 0956 Gross per 24 hour  Intake 750 ml  Output 0 ml  Net 750 ml    PHYSICAL EXAMINATION:  GENERAL:  52 y.o.-year-old patient lying in the bed with no acute distress.  EYES: Pupils equal, round, reactive to light and accommodation. No scleral icterus. Extraocular muscles intact.  HEENT: Head atraumatic, normocephalic. Oropharynx and nasopharynx clear.  NECK:  Supple, no jugular venous distention. No thyroid enlargement, no tenderness.  LUNGS: Normal breath sounds bilaterally, no wheezing, rales,rhonchi or crepitation. No use of accessory muscles of respiration.  CARDIOVASCULAR: S1,  S2 normal. No murmurs, rubs, or gallops.  ABDOMEN: Soft, non-tender, non-distended. Bowel sounds present. No organomegaly or mass.  EXTREMITIES: No pedal edema, cyanosis, or clubbing.  NEUROLOGIC: Cranial nerves II through XII are intact. Muscle strength 5/5 in all extremities. Sensation intact. Gait not checked.  PSYCHIATRIC: The patient is alert and oriented x 3.  SKIN: No obvious rash, lesion, or ulcer.   DATA REVIEW:   CBC Recent Labs  Lab 07/02/17 0427  WBC 11.2*  HGB 7.9*  HCT 23.8*  PLT 69*    Chemistries  Recent Labs  Lab 06/28/17 1450  07/02/17 0427  NA 135   < > 135  K 3.4*   < > 3.3*   CL 110   < > 109  CO2 16*   < > 22  GLUCOSE 90   < > 74  BUN 13   < > 10  CREATININE 1.17   < > 0.87  CALCIUM 7.2*   < > 7.0*  AST 47*  --   --   ALT 18  --   --   ALKPHOS 384*  --   --   BILITOT 2.1*  --   --    < > = values in this interval not displayed.    Cardiac Enzymes No results for input(s): TROPONINI in the last 168 hours.  Microbiology Results  Results for orders placed or performed during the hospital encounter of 06/28/17  Culture, blood (Routine x 2)     Status: Abnormal   Collection Time: 06/28/17  2:50 PM  Result Value Ref Range Status   Specimen Description   Final    BLOOD LEFT ANTECUBITAL Performed at [facility], [address] [place], [place] [address]    Special Requests   Final    BOTTLES DRAWN AEROBIC AND ANAEROBIC Blood Culture results may not be optimal due to an excessive volume of blood received in culture bottles Performed at [facility], [address], [place], [place] [address]    Culture  Setup Time   Final    GRAM POSITIVE COCCI IN BOTH AEROBIC AND ANAEROBIC BOTTLES CRITICAL RESULT CALLED TO, READ BACK BY AND VERIFIED WITH: DAVID BESANTI AT 0814 06/29/17.PMH Performed at [facility], [address] [place], [place] [address]    Culture (A)  Final    GROUP B STREP(S.AGALACTIAE)ISOLATED SUSCEPTIBILITIES PERFORMED ON PREVIOUS CULTURE WITHIN THE LAST 5 DAYS. Performed at [facility], [place] [address], [place], [place] [address]    Report Status 07/01/2017 FINAL  Final  Culture, blood (Routine x 2)     Status: Abnormal   Collection Time: 06/28/17  2:50 PM  Result Value Ref Range Status   Specimen Description   Final    BLOOD RIGHT ANTECUBITAL Performed at [facility], [address] [place], [place] [address]    Special Requests   Final    BOTTLES DRAWN AEROBIC AND ANAEROBIC Blood Culture adequate volume Performed at [facility], [address] [place],  [address]     Culture  Setup Time   Final    Organism ID to follow GRAM POSITIVE COCCI IN BOTH AEROBIC AND ANAEROBIC BOTTLES CRITICAL RESULT CALLED TO, READ BACK BY AND VERIFIED WITH: DAVID BESANTI AT 5027 06/29/17.PMH Performed at [facility], [address], [place],  [address]    Culture GROUP B STREP(S.AGALACTIAE)ISOLATED (A)  Final   Report Status 07/01/2017 FINAL  Final   Organism ID, Bacteria GROUP B  STREP(S.AGALACTIAE)ISOLATED  Final      Susceptibility   Group b strep(s.agalactiae)isolated - MIC*    CLINDAMYCIN <=0.25 SENSITIVE Sensitive     AMPICILLIN <=0.25 SENSITIVE Sensitive     ERYTHROMYCIN 2 RESISTANT Resistant     VANCOMYCIN 0.5 SENSITIVE Sensitive     CEFTRIAXONE <=0.12 SENSITIVE Sensitive     LEVOFLOXACIN 0.5 SENSITIVE Sensitive     * GROUP B STREP(S.AGALACTIAE)ISOLATED  Blood Culture ID Panel (Reflexed)     Status: Abnormal   Collection Time: 06/28/17  2:50 PM  Result Value Ref Range Status   Enterococcus species NOT DETECTED NOT DETECTED Final   Listeria monocytogenes NOT DETECTED NOT DETECTED Final   Staphylococcus species NOT DETECTED NOT DETECTED Final   Staphylococcus aureus NOT DETECTED NOT DETECTED Final   Streptococcus species DETECTED (A) NOT DETECTED Final    Comment: CRITICAL RESULT CALLED TO, READ BACK BY AND VERIFIED WITH: DAVID BESANTI AT 6283 06/29/17.PMH    Streptococcus agalactiae DETECTED (A) NOT DETECTED Final    Comment: CRITICAL RESULT CALLED TO, READ BACK BY AND VERIFIED WITH: DAVID BESANTI AT 1517 06/29/17.PMH    Streptococcus pneumoniae NOT DETECTED NOT DETECTED Final   Streptococcus pyogenes NOT DETECTED NOT DETECTED Final   Acinetobacter baumannii NOT DETECTED NOT DETECTED Final   Enterobacteriaceae species NOT DETECTED NOT DETECTED Final   Enterobacter cloacae complex NOT DETECTED NOT DETECTED Final   Escherichia coli NOT DETECTED NOT DETECTED Final   Klebsiella oxytoca NOT DETECTED NOT DETECTED Final   Klebsiella pneumoniae  NOT DETECTED NOT DETECTED Final   Proteus species NOT DETECTED NOT DETECTED Final   Serratia marcescens NOT DETECTED NOT DETECTED Final   Haemophilus influenzae NOT DETECTED NOT DETECTED Final   Neisseria meningitidis NOT DETECTED NOT DETECTED Final   Pseudomonas aeruginosa NOT DETECTED NOT DETECTED Final   Candida albicans NOT DETECTED NOT DETECTED Final   Candida glabrata NOT DETECTED NOT DETECTED Final   Candida krusei NOT DETECTED NOT DETECTED Final   Candida parapsilosis NOT DETECTED NOT DETECTED Final   Candida tropicalis NOT DETECTED NOT DETECTED Final    Comment: Performed at [facility], [address], [place], [place] [address]  Urine culture     Status: Abnormal   Collection Time: 06/28/17  6:03 PM  Result Value Ref Range Status   Specimen Description   Final    URINE, RANDOM Performed at [facility], [address], [place], [place] [address]    Special Requests   Final    NONE Performed at [facility], [place], [place] [address]    Culture (A)  Final    <10,000 COLONIES/mL INSIGNIFICANT GROWTH Performed at [facility], [place] [address], [place], [place] [address]    Report Status 06/30/2017 FINAL  Final    RADIOLOGY:  No results found.  EKG:   Orders placed or performed during the hospital encounter of 06/28/17  . EKG 12-Lead  . EKG 12-Lead  . ED EKG 12-Lead  . ED EKG 12-Lead      Management plans discussed with the patient, family and they are in agreement.  CODE STATUS:     Code Status Orders  (From admission, onward)        Start     Ordered   06/28/17 1948  Full code  Continuous     06/28/17 1948    Code Status History    Date Active Date Inactive Code Status Order ID Comments User Context   02/29/2016  1726 03/05/2016 1718 Full Code [id number]  Dustin Flock, MD ED      TOTAL TIME TAKING CARE OF THIS PATIENT: 45 minutes.    Avel Peace Willies Laviolette M.D on 07/02/2017 at 10:00 AM  Between  7am to 6pm - Pager - [phone]  After 6pm go to [email or web address] - password [email or web address] [facility]  Office  [phone]  CC: Primary care physician; Gradie Body, MD   Note: This dictation was prepared with Dragon dictation along with smaller phrase technology. Any transcriptional errors that result from this process are unintentional.

## 2017-07-02 NOTE — Progress Notes (Signed)
Pharmacist consulted for K+ replacement for K+ of 3.3; "Pharmacy has not been referred to by Attending to dose"; Dr. Jodell Cipro notified of K+ of 3.3; Acknowledged; new order written. Barbaraann Faster, RN; 6:05 AM 07/02/2017

## 2017-07-02 NOTE — Progress Notes (Signed)
Discharge teaching given to patient, patient verbalized understanding and had no questions. Patient IV removed. Patient will be transported home by family. All patient belongings gathered prior to leaving.  

## 2017-07-03 LAB — H. PYLORI ANTIGEN, STOOL: H. Pylori Stool Ag, Eia: NEGATIVE

## 2017-07-04 ENCOUNTER — Encounter: Payer: Self-pay | Admitting: Gastroenterology

## 2017-07-16 ENCOUNTER — Other Ambulatory Visit: Payer: Self-pay | Admitting: Oncology

## 2017-11-06 ENCOUNTER — Other Ambulatory Visit: Payer: Self-pay | Admitting: Gastroenterology

## 2017-11-06 DIAGNOSIS — K7682 Hepatic encephalopathy: Secondary | ICD-10-CM

## 2017-11-06 DIAGNOSIS — K7290 Hepatic failure, unspecified without coma: Secondary | ICD-10-CM

## 2017-11-06 DIAGNOSIS — K7460 Unspecified cirrhosis of liver: Secondary | ICD-10-CM

## 2017-11-15 ENCOUNTER — Ambulatory Visit: Payer: Medicaid Other

## 2017-11-29 ENCOUNTER — Ambulatory Visit
Admission: RE | Admit: 2017-11-29 | Discharge: 2017-11-29 | Disposition: A | Discharge status: Home / Self Care | Payer: Medicaid Other | Source: Ambulatory Visit | Attending: Gastroenterology | Admitting: Gastroenterology

## 2017-11-29 DIAGNOSIS — K7290 Hepatic failure, unspecified without coma: Secondary | ICD-10-CM | POA: Insufficient documentation

## 2017-11-29 DIAGNOSIS — K7682 Hepatic encephalopathy: Secondary | ICD-10-CM

## 2017-11-29 DIAGNOSIS — K7460 Unspecified cirrhosis of liver: Secondary | ICD-10-CM | POA: Insufficient documentation

## 2017-11-29 DIAGNOSIS — R161 Splenomegaly, not elsewhere classified: Secondary | ICD-10-CM | POA: Diagnosis not present

## 2018-03-17 ENCOUNTER — Other Ambulatory Visit: Payer: Self-pay | Admitting: Gastroenterology

## 2018-03-17 DIAGNOSIS — K7030 Alcoholic cirrhosis of liver without ascites: Secondary | ICD-10-CM

## 2018-03-28 ENCOUNTER — Ambulatory Visit
Admission: RE | Admit: 2018-03-28 | Discharge: 2018-03-28 | Disposition: A | Discharge status: Home / Self Care | Payer: Disability Insurance | Source: Ambulatory Visit | Attending: Gastroenterology | Admitting: Gastroenterology

## 2018-04-02 ENCOUNTER — Emergency Department
Admission: EM | Admit: 2018-04-02 | Discharge: 2018-04-02 | Disposition: A | Discharge status: Home / Self Care | Payer: Medicaid Other | Attending: Emergency Medicine | Admitting: Emergency Medicine

## 2018-04-02 ENCOUNTER — Encounter: Payer: Self-pay | Admitting: Emergency Medicine

## 2018-04-02 ENCOUNTER — Other Ambulatory Visit: Payer: Self-pay

## 2018-04-02 DIAGNOSIS — T50901A Poisoning by unspecified drugs, medicaments and biological substances, accidental (unintentional), initial encounter: Secondary | ICD-10-CM | POA: Insufficient documentation

## 2018-04-02 DIAGNOSIS — R42 Dizziness and giddiness: Secondary | ICD-10-CM | POA: Insufficient documentation

## 2018-04-02 DIAGNOSIS — K7030 Alcoholic cirrhosis of liver without ascites: Secondary | ICD-10-CM | POA: Diagnosis not present

## 2018-04-02 HISTORY — DX: Unspecified osteoarthritis, unspecified site: M19.90

## 2018-04-02 LAB — CBC
HCT: 41.5 % (ref 39.0–52.0)
Hemoglobin: 14 g/dL (ref 13.0–17.0)
MCH: 33.1 pg (ref 26.0–34.0)
MCHC: 33.7 g/dL (ref 30.0–36.0)
MCV: 98.1 fL (ref 80.0–100.0)
PLATELETS: 53 10*3/uL — AB (ref 150–400)
RBC: 4.23 MIL/uL (ref 4.22–5.81)
RDW: 15.6 % — AB (ref 11.5–15.5)
WBC: 4.4 10*3/uL (ref 4.0–10.5)
nRBC: 0 % (ref 0.0–0.2)

## 2018-04-02 LAB — URINALYSIS, COMPLETE (UACMP) WITH MICROSCOPIC
Bacteria, UA: NONE SEEN
Bilirubin Urine: NEGATIVE
Glucose, UA: NEGATIVE mg/dL
Ketones, ur: NEGATIVE mg/dL
Leukocytes,Ua: NEGATIVE
Nitrite: NEGATIVE
Protein, ur: NEGATIVE mg/dL
SQUAMOUS EPITHELIAL / LPF: NONE SEEN (ref 0–5)
Specific Gravity, Urine: 1.008 (ref 1.005–1.030)
pH: 6 (ref 5.0–8.0)

## 2018-04-02 LAB — COMPREHENSIVE METABOLIC PANEL
ALT: 34 U/L (ref 0–44)
AST: 58 U/L — ABNORMAL HIGH (ref 15–41)
Albumin: 2.7 g/dL — ABNORMAL LOW (ref 3.5–5.0)
Alkaline Phosphatase: 207 U/L — ABNORMAL HIGH (ref 38–126)
Anion gap: 9 (ref 5–15)
BILIRUBIN TOTAL: 2 mg/dL — AB (ref 0.3–1.2)
BUN: 9 mg/dL (ref 6–20)
CO2: 20 mmol/L — ABNORMAL LOW (ref 22–32)
Calcium: 8.6 mg/dL — ABNORMAL LOW (ref 8.9–10.3)
Chloride: 109 mmol/L (ref 98–111)
Creatinine, Ser: 0.67 mg/dL (ref 0.61–1.24)
GFR calc Af Amer: >60 mL/min (ref >60)
GFR calc non Af Amer: >60 mL/min (ref >60)
Glucose, Bld: 105 mg/dL — ABNORMAL HIGH (ref 70–99)
POTASSIUM: 4.2 mmol/L (ref 3.5–5.1)
Sodium: 138 mmol/L (ref 135–145)
Total Protein: 6.7 g/dL (ref 6.5–8.1)

## 2018-04-02 LAB — TROPONIN I: Troponin I: <0.03 ng/mL (ref <0.03)

## 2018-04-02 NOTE — ED Notes (Signed)
Pt verbalized understanding of d/c instructions, and f/u care. No further questions at this time. Pt ambulatory to the exit with steady gait.  

## 2018-04-02 NOTE — ED Triage Notes (Signed)
Pt in via POV, states, "I just wasn't feeling well last night, I got confused and I havent been able to eat."  Vitals WDL, A/Ox4, NAD noted at this time.

## 2018-04-02 NOTE — ED Provider Notes (Signed)
[facility] Emergency Department Provider Note  ____________________________________________  Time seen: Approximately 6:28 PM  I have reviewed the triage vital signs and the nursing notes.   HISTORY  Chief Complaint Altered Mental Status    HPI Jordan Hayden is a 53 y.o. male history of alcoholic cirrhosis and iron deficiency anemia presenting with lightheadedness.  The patient reports that he believes he made a mistake with his medications.  He cannot describe which medication he took by mistake he just says it is "that big pill."  He states that the pharmacist is the one who puts his pills into blister packs, and he must have taken the wrong pill.  He describes that the reason he came was that he stood up and had a lightheadedness sensation that lasted 15 to 20 minutes and now has completely resolved.  He did not have any associated chest pain, shortness of breath, palpitations, numbness tingling or weakness.  He has been eating and drinking normally and has not had any infectious symptoms.  Past Medical History:  Diagnosis Date  . Alcohol abuse   . Alcoholic cirrhosis ([place])   . Alcoholism ([place])   . Arthritis   . Cirrhosis [facility])     Patient Active Problem List   Diagnosis Date Noted  . Sepsis ([place]) 06/28/2017  . Iron deficiency anemia 05/19/2017  . Anasarca 02/29/2016    Past Surgical History:  Procedure Laterality Date  . ESOPHAGOGASTRODUODENOSCOPY (EGD) WITH PROPOFOL N/A 07/01/2017   Procedure: ESOPHAGOGASTRODUODENOSCOPY (EGD) WITH PROPOFOL;  Surgeon: Lucilla Lame, MD;  [facility] END[facility]COPY;  Service: Endoscopy;  Laterality: N/A;    Current Outpatient Rx  . Order #: [id number] Class: Historical Med  . Order #: [id number] Class: Normal  . Order #: [id number] Class: Print  . Order #: [id number] Class: Print  . Order #: [id number] Class: Print  . Order #: [id number] Class: Historical Med  . Order #: [id number] Class: Historical Med  . Order #:  [id number] Class: Print  . Order #: [id number] Class: Print  . Order #: [id number] Class: Historical Med  . Order #: [id number] Class: Print  . Order #: [id number] Class: Historical Med  . Order #: [id number] Class: Print    Allergies Hydrocodone-acetaminophen  Family History  Problem Relation Age of Onset  . Hypertension Mother   . Diabetes Father   . Cervical cancer Sister   . Cirrhosis Brother     Social History Social History   Tobacco Use  . Smoking status: Never Smoker  . Smokeless tobacco: Never Used  Substance Use Topics  . Alcohol use: No    Comment: quit 2 years   . Drug use: No    Review of Systems Constitutional: No fever/chills.  Positive lightheadedness without syncope.  No dizziness. Eyes: No visual changes.  No eye discharge. ENT: No sore throat. No congestion or rhinorrhea. Cardiovascular: Denies chest pain. Denies palpitations. Respiratory: Denies shortness of breath.  No cough. Gastrointestinal: No abdominal pain.  No nausea, no vomiting.  No diarrhea.  No constipation. Genitourinary: Negative for dysuria. Musculoskeletal: Negative for back pain.  Lower extremity swelling or calf pain. Skin: Negative for rash. Neurological: Negative for headaches. No focal numbness, tingling or weakness.     ____________________________________________   PHYSICAL EXAM:  VITAL SIGNS: ED Triage Vitals [04/02/18 1511]  Enc Vitals Group     BP (!) 168/77     Pulse Rate 100     Resp 20     Temp 98.7 F (37.1 C)     Temp Source Oral     SpO2 98 %  Weight 292 lb (132.5 kg)     Height 5\' 8"  (1.727 m)     Head Circumference      Peak Flow      Pain Score 0     Pain Loc      Pain Edu?      Excl. in [place]?     Constitutional: Alert and oriented.  Answers questions appropriately. Eyes: Conjunctivae are normal.  EOMI. No scleral icterus. Head: Atraumatic. Nose: No congestion/rhinnorhea. Mouth/Throat: Mucous membranes are moist.  Neck: No stridor.  Supple.  JVD.  No  meningismus. Cardiovascular: Normal rate, regular rhythm. No murmurs, rubs or gallops.  Respiratory: Normal respiratory effort.  No accessory muscle use or retractions. Lungs CTAB.  No wheezes, rales or ronchi. Gastrointestinal: Obese.  Soft, nontender and nondistended.  No guarding or rebound.  No peritoneal signs. Musculoskeletal: No LE edema. No ttp in the calves or palpable cords.  Negative Homan's sign. Neurologic:  A&Ox3.  Speech is clear.  Face and smile are symmetric.  EOMI.  Moves all extremities well. Skin:  Skin is warm, dry and intact. No rash noted. Psychiatric: Mood and affect are normal. Speech and behavior are normal.  Normal judgement.  ____________________________________________   LABS (all labs ordered are listed, but only abnormal results are displayed)  Labs Reviewed  COMPREHENSIVE METABOLIC PANEL - Abnormal; Notable for the following components:      Result Value   CO2 20 (*)    Glucose, Bld 105 (*)    Calcium 8.6 (*)    Albumin 2.7 (*)    AST 58 (*)    Alkaline Phosphatase 207 (*)    Total Bilirubin 2.0 (*)    All other components within normal limits  CBC - Abnormal; Notable for the following components:   RDW 15.6 (*)    Platelets 53 (*)    All other components within normal limits  TROPONIN I  URINALYSIS, COMPLETE (UACMP) WITH MICROSCOPIC  CBG MONITORING, ED   ____________________________________________  EKG  ED ECG REPORT I, Anne-Caroline Mariea Clonts, the attending physician, personally viewed and interpreted this ECG.   Date: 04/02/2018  EKG Time: 1526  Rate: 98  Rhythm: normal sinus rhythm  Axis: normal  Intervals:none  ST&T Change: No STEMI   ____________________________________________  RADIOLOGY  No results found.  ____________________________________________   PROCEDURES  Procedure(s) performed: None  Procedures  Critical Care performed: No ____________________________________________   INITIAL IMPRESSION / ASSESSMENT  AND PLAN / ED COURSE  Pertinent labs & imaging results that were available during my care of the patient were reviewed by me and considered in my medical decision making (see chart for details).  53 y.o. who thinks he made a mistake with his medications but cannot be more descriptive about this, who is presenting for a lightheaded episode that lasted 15 to 20 minutes and has completely resolved without any other symptoms.  Overall, the patient is hemodynamically stable here.  He has a normal neurologic examination and a normal cardiopulmonary examination.  His work-up is reassuring with normal electrolytes, abnormal LFTs that are at baseline, and no evidence of anemia.  In addition his troponin is less than 0.03.  His EKG does not show arrhythmia or ischemia.  At this time, the patient is stable for discharge home.  I have discussed medication safety with him, and he understands follow-up instructions as well as return precautions.  ____________________________________________  FINAL CLINICAL IMPRESSION(S) / ED DIAGNOSES  Final diagnoses:  Lightheadedness  Accidental medication error, initial encounter  NEW MEDICATIONS STARTED DURING THIS VISIT:  New Prescriptions   No medications on file      Eula Listen, MD 04/02/18 [phone]

## 2018-04-02 NOTE — ED Notes (Signed)
Pt state that he woke up feeling "unwell" however "after being up for a while I feel back to normal, I think I just mixed up my, medications last night". Pt is alert and oriented x4, denies any pain or complaints at this time.

## 2018-04-02 NOTE — Discharge Instructions (Addendum)
Make sure that you take your medications as prescribed, at the time they are prescribed.  Drink plenty of fluid to stay well-hydrated make sure that your lightheadedness goes away.  Please take care to move slowly from laying down to seated and standing positions to prevent fainting.  Return to the emergency department if you develop severe pain, lightheadedness or fainting, chest pain, shortness of breath, fever, or any other symptoms concerning to you.

## 2018-04-08 ENCOUNTER — Other Ambulatory Visit: Payer: Self-pay | Admitting: Pediatrics

## 2018-04-08 DIAGNOSIS — M79641 Pain in right hand: Secondary | ICD-10-CM

## 2018-05-30 ENCOUNTER — Ambulatory Visit
Admission: RE | Admit: 2018-05-30 | Discharge: 2018-05-30 | Disposition: A | Discharge status: Home / Self Care | Payer: Medicaid Other | Source: Ambulatory Visit | Attending: Pediatrics | Admitting: Pediatrics

## 2018-05-30 ENCOUNTER — Ambulatory Visit: Payer: Medicaid Other

## 2018-05-30 DIAGNOSIS — M79641 Pain in right hand: Secondary | ICD-10-CM

## 2018-06-16 ENCOUNTER — Encounter: Payer: Self-pay | Admitting: Emergency Medicine

## 2018-06-16 ENCOUNTER — Other Ambulatory Visit: Payer: Self-pay

## 2018-06-16 ENCOUNTER — Emergency Department: Payer: Medicaid Other

## 2018-06-16 ENCOUNTER — Inpatient Hospital Stay
Admission: EM | Admit: 2018-06-16 | Discharge: 2018-06-18 | DRG: 603 | Disposition: A | Discharge status: Home / Self Care | Payer: Medicaid Other | Attending: Internal Medicine | Admitting: Internal Medicine

## 2018-06-16 DIAGNOSIS — Z8049 Family history of malignant neoplasm of other genital organs: Secondary | ICD-10-CM | POA: Diagnosis not present

## 2018-06-16 DIAGNOSIS — L0390 Cellulitis, unspecified: Secondary | ICD-10-CM | POA: Diagnosis present

## 2018-06-16 DIAGNOSIS — Z1159 Encounter for screening for other viral diseases: Secondary | ICD-10-CM | POA: Diagnosis not present

## 2018-06-16 DIAGNOSIS — Z885 Allergy status to narcotic agent status: Secondary | ICD-10-CM

## 2018-06-16 DIAGNOSIS — M1990 Unspecified osteoarthritis, unspecified site: Secondary | ICD-10-CM | POA: Diagnosis present

## 2018-06-16 DIAGNOSIS — K219 Gastro-esophageal reflux disease without esophagitis: Secondary | ICD-10-CM | POA: Diagnosis present

## 2018-06-16 DIAGNOSIS — F1020 Alcohol dependence, uncomplicated: Secondary | ICD-10-CM | POA: Diagnosis present

## 2018-06-16 DIAGNOSIS — Z7982 Long term (current) use of aspirin: Secondary | ICD-10-CM

## 2018-06-16 DIAGNOSIS — L03116 Cellulitis of left lower limb: Secondary | ICD-10-CM | POA: Diagnosis present

## 2018-06-16 DIAGNOSIS — K7030 Alcoholic cirrhosis of liver without ascites: Secondary | ICD-10-CM | POA: Diagnosis present

## 2018-06-16 DIAGNOSIS — Z79899 Other long term (current) drug therapy: Secondary | ICD-10-CM

## 2018-06-16 LAB — CBC WITH DIFFERENTIAL/PLATELET
Abs Immature Granulocytes: 0.06 10*3/uL (ref 0.00–0.07)
Basophils Absolute: 0 10*3/uL (ref 0.0–0.1)
Basophils Relative: 1 %
Eosinophils Absolute: 0.3 10*3/uL (ref 0.0–0.5)
Eosinophils Relative: 4 %
HCT: 38 % — ABNORMAL LOW (ref 39.0–52.0)
Hemoglobin: 13 g/dL (ref 13.0–17.0)
Immature Granulocytes: 1 %
Lymphocytes Relative: 13 %
Lymphs Abs: 1.1 10*3/uL (ref 0.7–4.0)
MCH: 34.3 pg — ABNORMAL HIGH (ref 26.0–34.0)
MCHC: 34.2 g/dL (ref 30.0–36.0)
MCV: 100.3 fL — ABNORMAL HIGH (ref 80.0–100.0)
Monocytes Absolute: 1 10*3/uL (ref 0.1–1.0)
Monocytes Relative: 12 %
Neutro Abs: 5.9 10*3/uL (ref 1.7–7.7)
Neutrophils Relative %: 69 %
Platelets: 49 10*3/uL — ABNORMAL LOW (ref 150–400)
RBC: 3.79 MIL/uL — ABNORMAL LOW (ref 4.22–5.81)
RDW: 16.8 % — ABNORMAL HIGH (ref 11.5–15.5)
WBC: 8.4 10*3/uL (ref 4.0–10.5)
nRBC: 0 % (ref 0.0–0.2)

## 2018-06-16 LAB — COMPREHENSIVE METABOLIC PANEL
ALT: 31 U/L (ref 0–44)
AST: 49 U/L — ABNORMAL HIGH (ref 15–41)
Albumin: 2.5 g/dL — ABNORMAL LOW (ref 3.5–5.0)
Alkaline Phosphatase: 128 U/L — ABNORMAL HIGH (ref 38–126)
Anion gap: 6 (ref 5–15)
BUN: 19 mg/dL (ref 6–20)
CO2: 25 mmol/L (ref 22–32)
Calcium: 8.5 mg/dL — ABNORMAL LOW (ref 8.9–10.3)
Chloride: 106 mmol/L (ref 98–111)
Creatinine, Ser: 1.15 mg/dL (ref 0.61–1.24)
GFR calc Af Amer: >60 mL/min (ref >60)
GFR calc non Af Amer: >60 mL/min (ref >60)
Glucose, Bld: 120 mg/dL — ABNORMAL HIGH (ref 70–99)
Potassium: 4.4 mmol/L (ref 3.5–5.1)
Sodium: 137 mmol/L (ref 135–145)
Total Bilirubin: 6 mg/dL — ABNORMAL HIGH (ref 0.3–1.2)
Total Protein: 6.3 g/dL — ABNORMAL LOW (ref 6.5–8.1)

## 2018-06-16 LAB — SEDIMENTATION RATE: Sed Rate: 24 mm/hr — ABNORMAL HIGH (ref 0–20)

## 2018-06-16 LAB — LACTIC ACID, PLASMA
Lactic Acid, Venous: 1.1 mmol/L (ref 0.5–1.9)
Lactic Acid, Venous: 1.1 mmol/L (ref 0.5–1.9)

## 2018-06-16 LAB — SARS CORONAVIRUS 2 BY RT PCR (HOSPITAL ORDER, PERFORMED IN ~~LOC~~ HOSPITAL LAB): SARS Coronavirus 2: NEGATIVE

## 2018-06-16 LAB — URIC ACID: Uric Acid, Serum: 5.1 mg/dL (ref 3.7–8.6)

## 2018-06-16 LAB — BRAIN NATRIURETIC PEPTIDE: B Natriuretic Peptide: 97 pg/mL (ref 0.0–100.0)

## 2018-06-16 MED ORDER — FOLIC ACID 1 MG PO TABS
1.0000 mg | ORAL_TABLET | Freq: Every day | ORAL | Status: DC
  Administered 2018-06-16 – 2018-06-18 (×3): 1 mg via ORAL
  Filled 2018-06-16 (×3): qty 1

## 2018-06-16 MED ORDER — LORAZEPAM 2 MG/ML IJ SOLN: 1.0000 mg | Freq: Four times a day (QID) | INTRAMUSCULAR | Status: DC | PRN

## 2018-06-16 MED ORDER — POTASSIUM CHLORIDE CRYS ER 20 MEQ PO TBCR
40.0000 meq | EXTENDED_RELEASE_TABLET | Freq: Two times a day (BID) | ORAL | Status: DC
  Administered 2018-06-17 (×2): 40 meq via ORAL
  Administered 2018-06-18: 20 meq via ORAL
  Filled 2018-06-16 (×3): qty 2

## 2018-06-16 MED ORDER — SODIUM CHLORIDE 0.9% FLUSH: 3.0000 mL | INTRAVENOUS | Status: DC | PRN

## 2018-06-16 MED ORDER — ONDANSETRON HCL 4 MG PO TABS: 4.0000 mg | ORAL_TABLET | Freq: Four times a day (QID) | ORAL | Status: DC | PRN

## 2018-06-16 MED ORDER — VITAMIN B-1 100 MG PO TABS
100.0000 mg | ORAL_TABLET | Freq: Every day | ORAL | Status: DC
  Administered 2018-06-16 – 2018-06-18 (×3): 100 mg via ORAL
  Filled 2018-06-16 (×3): qty 1

## 2018-06-16 MED ORDER — SODIUM CHLORIDE 0.9 % IV SOLN
INTRAVENOUS | Status: DC | PRN
  Administered 2018-06-16 – 2018-06-18 (×3): via INTRAVENOUS

## 2018-06-16 MED ORDER — POLYETHYLENE GLYCOL 3350 17 G PO PACK: 17.0000 g | PACK | Freq: Every day | ORAL | Status: DC | PRN

## 2018-06-16 MED ORDER — SALINE SPRAY 0.65 % NA SOLN
1.0000 | NASAL | Status: DC | PRN
  Filled 2018-06-16: qty 44

## 2018-06-16 MED ORDER — FUROSEMIDE 20 MG PO TABS
20.0000 mg | ORAL_TABLET | Freq: Every day | ORAL | Status: DC
  Administered 2018-06-17 – 2018-06-18 (×2): 20 mg via ORAL
  Filled 2018-06-16 (×2): qty 1

## 2018-06-16 MED ORDER — SODIUM CHLORIDE 0.9 % IV SOLN
1.0000 g | Freq: Once | INTRAVENOUS | Status: AC
  Administered 2018-06-16: 1 g via INTRAVENOUS
  Filled 2018-06-16: qty 10

## 2018-06-16 MED ORDER — SODIUM CHLORIDE 0.9% FLUSH
3.0000 mL | Freq: Two times a day (BID) | INTRAVENOUS | Status: DC
  Administered 2018-06-16 – 2018-06-18 (×3): 3 mL via INTRAVENOUS

## 2018-06-16 MED ORDER — ALBUTEROL SULFATE (2.5 MG/3ML) 0.083% IN NEBU
2.5000 mg | INHALATION_SOLUTION | Freq: Four times a day (QID) | RESPIRATORY_TRACT | Status: DC
  Administered 2018-06-16 – 2018-06-17 (×4): 2.5 mg via RESPIRATORY_TRACT
  Filled 2018-06-16 (×3): qty 3

## 2018-06-16 MED ORDER — THIAMINE HCL 100 MG/ML IJ SOLN: 100.0000 mg | Freq: Every day | INTRAMUSCULAR | Status: DC

## 2018-06-16 MED ORDER — SODIUM CHLORIDE 0.9 % IV SOLN: 250.0000 mL | INTRAVENOUS | Status: DC | PRN

## 2018-06-16 MED ORDER — LORAZEPAM 1 MG PO TABS: 1.0000 mg | ORAL_TABLET | Freq: Four times a day (QID) | ORAL | Status: DC | PRN

## 2018-06-16 MED ORDER — ENOXAPARIN SODIUM 40 MG/0.4ML ~~LOC~~ SOLN: 40.0000 mg | SUBCUTANEOUS | Status: DC

## 2018-06-16 MED ORDER — ONDANSETRON HCL 4 MG/2ML IJ SOLN: 4.0000 mg | Freq: Four times a day (QID) | INTRAMUSCULAR | Status: DC | PRN

## 2018-06-16 MED ORDER — HYDROXYZINE HCL 25 MG PO TABS: 25.0000 mg | ORAL_TABLET | Freq: Three times a day (TID) | ORAL | Status: DC | PRN

## 2018-06-16 MED ORDER — ASPIRIN EC 81 MG PO TBEC
81.0000 mg | DELAYED_RELEASE_TABLET | Freq: Every day | ORAL | Status: DC
  Administered 2018-06-17 – 2018-06-18 (×2): 81 mg via ORAL
  Filled 2018-06-16 (×2): qty 1

## 2018-06-16 MED ORDER — PANTOPRAZOLE SODIUM 40 MG PO TBEC
40.0000 mg | DELAYED_RELEASE_TABLET | Freq: Every day | ORAL | Status: DC
  Administered 2018-06-17 – 2018-06-18 (×2): 40 mg via ORAL
  Filled 2018-06-16 (×2): qty 1

## 2018-06-16 MED ORDER — FLUTICASONE PROPIONATE 50 MCG/ACT NA SUSP
2.0000 | NASAL | Status: DC | PRN
  Filled 2018-06-16: qty 16

## 2018-06-16 MED ORDER — CEFAZOLIN SODIUM-DEXTROSE 2-4 GM/100ML-% IV SOLN
2.0000 g | Freq: Three times a day (TID) | INTRAVENOUS | Status: DC
  Administered 2018-06-16 – 2018-06-18 (×5): 2 g via INTRAVENOUS
  Filled 2018-06-16 (×7): qty 100

## 2018-06-16 MED ORDER — VITAMIN D (ERGOCALCIFEROL) 1.25 MG (50000 UNIT) PO CAPS: 50000.0000 [IU] | ORAL_CAPSULE | ORAL | Status: DC

## 2018-06-16 MED ORDER — ADULT MULTIVITAMIN W/MINERALS CH
1.0000 | ORAL_TABLET | Freq: Every day | ORAL | Status: DC
  Administered 2018-06-16 – 2018-06-18 (×3): 1 via ORAL
  Filled 2018-06-16 (×3): qty 1

## 2018-06-16 MED ORDER — SPIRONOLACTONE 25 MG PO TABS
12.5000 mg | ORAL_TABLET | Freq: Two times a day (BID) | ORAL | Status: DC
  Administered 2018-06-17 – 2018-06-18 (×3): 12.5 mg via ORAL
  Filled 2018-06-16 (×3): qty 0.5
  Filled 2018-06-16 (×3): qty 1
  Filled 2018-06-16: qty 0.5

## 2018-06-16 MED ORDER — PNEUMOCOCCAL VAC POLYVALENT 25 MCG/0.5ML IJ INJ
0.5000 mL | INJECTION | INTRAMUSCULAR | Status: DC
  Filled 2018-06-16: qty 0.5

## 2018-06-16 MED ORDER — FERROUS SULFATE 325 (65 FE) MG PO TABS
325.0000 mg | ORAL_TABLET | Freq: Three times a day (TID) | ORAL | Status: DC
  Administered 2018-06-17 – 2018-06-18 (×4): 325 mg via ORAL
  Filled 2018-06-16 (×4): qty 1

## 2018-06-16 NOTE — ED Provider Notes (Signed)
[facility] Emergency Department Provider Note  ____________________________________________   None    (approximate)  I have reviewed the triage vital signs and the nursing notes.   HISTORY  Chief Complaint Joint Swelling    HPI Jordan Hayden is a 53 y.o. male presents emergency department complaining of left ankle swelling.   Patient states he was seen at neuro clinic and sent here due to the amount of redness and swelling.  States symptoms started 1 week ago.  Denies any shortness of breath.  Patient has history of cirrhosis.  No history of gout but thinks this may be gout.  He denies any chest pain or shortness of breath   Past Medical History:  Diagnosis Date  . Alcohol abuse   . Alcoholic cirrhosis ([place])   . Alcoholism ([place])   . Arthritis   . Cirrhosis [facility])     Patient Active Problem List   Diagnosis Date Noted  . Sepsis ([place]) 06/28/2017  . Iron deficiency anemia 05/19/2017  . Anasarca 02/29/2016    Past Surgical History:  Procedure Laterality Date  . ESOPHAGOGASTRODUODENOSCOPY (EGD) WITH PROPOFOL N/A 07/01/2017   Procedure: ESOPHAGOGASTRODUODENOSCOPY (EGD) WITH PROPOFOL;  Surgeon: Lucilla Lame, MD;  Location: [facility] ENDOSCOPY;  Service: Endoscopy;  Laterality: N/A;    Prior to Admission medications   Medication Sig Start Date End Date Taking? Authorizing Provider  aspirin EC 81 MG tablet Take 81 mg by mouth daily. 03/05/16   [provider]  eplerenone (INSPRA) 50 MG tablet Take 1 tablet by mouth daily. 05/24/17 05/24/18  [provider]  ergocalciferol (VITAMIN D2) 50000 units capsule Take 1 capsule (50,000 Units total) by mouth once a week. 05/19/17   Earlie Server, MD  ferrous sulfate 325 (65 FE) MG tablet Take 1 tablet (325 mg total) by mouth 3 (three) times daily with meals. 07/02/17   Salary, Avel Peace, MD  fluticasone (FLONASE) 50 MCG/ACT nasal spray Place 2 sprays into both nostrils daily. 07/02/17   Salary, Avel Peace,  MD  furosemide (LASIX) 20 MG tablet Take 1 tablet (20 mg total) by mouth daily. 07/02/17 07/02/18  Salary, Holly Bodily D, MD  hydrOXYzine (ATARAX/VISTARIL) 25 MG tablet Take 25 mg by mouth 3 (three) times daily as needed. 04/01/17   [provider]  pantoprazole (PROTONIX) 40 MG tablet Take 1 tablet (40 mg total) by mouth daily. 07/02/17 07/02/18  Salary, Avel Peace, MD  potassium chloride SA (K-DUR,KLOR-CON) 20 MEQ tablet Take 2 tablets (40 mEq total) by mouth 2 (two) times daily. 07/02/17   Salary, Avel Peace, MD  sodium chloride (OCEAN) 0.65 % SOLN nasal spray Place 1 spray into both nostrils as needed for congestion. 07/02/17   Salary, Avel Peace, MD  spironolactone (ALDACTONE) 25 MG tablet Take 0.5 tablets (12.5 mg total) by mouth 2 (two) times daily. 07/02/17   Salary, Avel Peace, MD    Allergies Hydrocodone-acetaminophen  Family History  Problem Relation Age of Onset  . Hypertension Mother   . Diabetes Father   . Cervical cancer Sister   . Cirrhosis Brother     Social History Social History   Tobacco Use  . Smoking status: Never Smoker  . Smokeless tobacco: Never Used  Substance Use Topics  . Alcohol use: No    Comment: quit 2 years   . Drug use: No    Review of Systems  Constitutional: No fever/chills Eyes: No visual changes. ENT: No sore throat. Respiratory: Denies cough Genitourinary: Negative for dysuria. Musculoskeletal: Negative for  back pain.  Positive for left ankle swelling Skin: Negative for rash.    ____________________________________________   PHYSICAL EXAM:  VITAL SIGNS: ED Triage Vitals  Enc Vitals Group     BP 06/16/18 1110 126/74     Pulse Rate 06/16/18 1110 93     Resp 06/16/18 1110 16     Temp 06/16/18 1110 98.8 F (37.1 C)     Temp Source 06/16/18 1110 Oral     SpO2 06/16/18 1110 98 %     Weight 06/16/18 1113 (!) 324 lb (147 kg)     Height 06/16/18 1113 5\' 8"  (1.727 m)     Head Circumference --      Peak Flow --      Pain Score  06/16/18 1113 6     Pain Loc --      Pain Edu? --      Excl. in [place]? --     Constitutional: Alert and oriented. Well appearing and in no acute distress. Eyes: Conjunctivae are normal.  Head: Atraumatic. Nose: No congestion/rhinnorhea. Mouth/Throat: Mucous membranes are moist.   Neck:  supple no lymphadenopathy noted Cardiovascular: Normal rate, regular rhythm. Heart sounds are normal Respiratory: Normal respiratory effort.  No retractions, lungs c t a  GU: deferred Musculoskeletal: Left lower extremity has a large amount of redness and swelling extending up to the knee, pitting edema, some drainage and weeping is noted of the anterior left lower leg, ankle is swollen and tender, foot is swollen, neurovascular appears to be intact  neurologic:  Normal speech and language.  Skin:  Skin is warm, dry and intact. No rash noted. Psychiatric: Mood and affect are normal. Speech and behavior are normal.  ____________________________________________   LABS (all labs ordered are listed, but only abnormal results are displayed)  Labs Reviewed  CBC WITH DIFFERENTIAL/PLATELET - Abnormal; Notable for the following components:      Result Value   RBC 3.79 (*)    HCT 38.0 (*)    MCV 100.3 (*)    MCH 34.3 (*)    RDW 16.8 (*)    Platelets 49 (*)    All other components within normal limits  COMPREHENSIVE METABOLIC PANEL - Abnormal; Notable for the following components:   Glucose, Bld 120 (*)    Calcium 8.5 (*)    Total Protein 6.3 (*)    Albumin 2.5 (*)    AST 49 (*)    Alkaline Phosphatase 128 (*)    Total Bilirubin 6.0 (*)    All other components within normal limits  SEDIMENTATION RATE - Abnormal; Notable for the following components:   Sed Rate 24 (*)    All other components within normal limits  SARS CORONAVIRUS 2 (HOSPITAL ORDER, [place] [facility])  URIC ACID  BRAIN NATRIURETIC PEPTIDE  LACTIC ACID, PLASMA  LACTIC ACID, PLASMA    ____________________________________________   ____________________________________________  RADIOLOGY  Chest x-ray shows vascular congestion Ultrasound left lower leg does not show a DVD or abscess  ____________________________________________   PROCEDURES  Procedure(s) performed: Ceftriaxone 1 g IV   Procedures    ____________________________________________   INITIAL IMPRESSION / ASSESSMENT AND PLAN / ED COURSE  Pertinent labs & imaging results that were available during my care of the patient were reviewed by me and considered in my medical decision making (see chart for details).   Patient is a 53 year old male presents emergency department complaining of left ankle pain and swelling.  Symptoms for 1 week.  He  was sent here by [facility] clinic.  He denies chest pain or shortness of breath.  Physical exam shows the left leg to be almost twice the size of the right, redness extends up above the knee.  Pitting edema is noted and weeping anteriorly.  DDX: cellulitis, dvt, abscess, chf   CBC has a normal WBC, sed rate is increased at 24, lactic acid is 1.1, BMP is normal, comprehensive metabolic panel shows hepatic abnormalities due to his cirrhosis.  Platelets were noted to be low at 49 but his history shows this to be approximately his norm   Chest x-ray shows vascular congestion Ultrasound left lower leg is negative for DVT or abscess  Dr. Cherylann Banas and talked to the patient as patient is wanting to leave.  Patient finally had Dr. Cherylann Banas talk to his wife by phone.  Patient is decided to stay and be admitted.  Ceftriaxone 1 g IV was given.  Hospitalist paged.  ----------------------------------------- 3:22 PM on 06/16/2018 -----------------------------------------  Hospitalist and see the patient for admission.  COVID test pending  Jordan Hayden was evaluated in Emergency Department on 06/16/2018 for the symptoms described in the history of present illness. He was  evaluated in the context of the global COVID-19 pandemic, which necessitated consideration that the patient might be at risk for infection with the SARS-CoV-2 virus that causes COVID-19. Institutional protocols and algorithms that pertain to the evaluation of patients at risk for COVID-19 are in a state of rapid change based on information released by regulatory bodies including the CDC and federal and state organizations. These policies and algorithms were followed during the patient's care in the ED.    As part of my medical decision making, I reviewed the following data within the [place] notes reviewed and incorporated, Labs reviewed see above, Old chart reviewed, Radiograph reviewed ultrasound negative, chest x-ray shows vascular congestion, Discussed with admitting physician hospitalist, Evaluated by EM attending Dr. Cherylann Banas, Notes from prior ED visits and [place] Controlled Substance Database  ____________________________________________   FINAL CLINICAL IMPRESSION(S) / ED DIAGNOSES  Final diagnoses:  Cellulitis of left lower extremity      NEW MEDICATIONS STARTED DURING THIS VISIT:  New Prescriptions   No medications on file     Note:  This document was prepared using Dragon voice recognition software and may include unintentional dictation errors.    Versie Starks, PA-C 06/16/18 1524    Arta Silence, MD 06/16/18 [phone]

## 2018-06-16 NOTE — ED Notes (Signed)
States he is ready to go home  Provider aware and is in with pt

## 2018-06-16 NOTE — ED Notes (Addendum)
See triage note  Presents with pain and some swelling to left ankle  Denies any injury  Good pulses   Left leg is swollen

## 2018-06-16 NOTE — H&P (Signed)
[place] [facility] [place] NAME: Jordan Hayden    MR#:  [id number]  DATE OF BIRTH:  08-14-1965  DATE OF ADMISSION:  06/16/2018  PRIMARY CARE PHYSICIAN: Jordan Body, MD   REQUESTING/REFERRING PHYSICIAN:   CHIEF COMPLAINT:   Chief Complaint  Patient presents with  . Joint Swelling    HISTORY OF PRESENT ILLNESS: Jordan Hayden  is a 53 y.o. male with a known history history per below, presents emergency room with 1 week history of worsening left leg/ankle swelling associated redness, pain with standing/ambulation, in the emergency room patient was noted to have cellulitis on examination, lower extremity Doppler was negative for DVT, hospitalist asked to admit, patient evaluated emergency room, no apparent distress, resting comfortably in bed, patient admitted for acute left lower extremity cellulitis.  PAST MEDICAL HISTORY:   Past Medical History:  Diagnosis Date  . Alcohol abuse   . Alcoholic cirrhosis ([place])   . Alcoholism ([place])   . Arthritis   . Cirrhosis ([place])     PAST SURGICAL HISTORY:  Past Surgical History:  Procedure Laterality Date  . ESOPHAGOGASTRODUODENOSCOPY (EGD) WITH PROPOFOL N/A 07/01/2017   Procedure: ESOPHAGOGASTRODUODENOSCOPY (EGD) WITH PROPOFOL;  Surgeon: Lucilla Lame, MD;  Location[facility] ENDOSCOPY;  Service: Endoscopy;  Laterality: N/A;    SOCIAL HISTORY:  Social History   Tobacco Use  . Smoking status: Never Smoker  . Smokeless tobacco: Never Used  Substance Use Topics  . Alcohol use: No    Comment: quit 2 years     FAMILY HISTORY:  Family History  Problem Relation Age of Onset  . Hypertension Mother   . Diabetes Father   . Cervical cancer Sister   . Cirrhosis Brother     DRUG ALLERGIES:  Allergies  Allergen Reactions  . Hydrocodone-Acetaminophen Swelling    Mouth Swelling     REVIEW OF SYSTEMS:   CONSTITUTIONAL: No fever, fatigue or weakness.  EYES: No blurred or double vision.  EARS,  NOSE, AND THROAT: No tinnitus or ear pain.  RESPIRATORY: No cough, shortness of breath, wheezing or hemoptysis.  CARDIOVASCULAR: No chest pain, orthopnea, edema.  GASTROINTESTINAL: No nausea, vomiting, diarrhea or abdominal pain.  GENITOURINARY: No dysuria, hematuria.  ENDOCRINE: No polyuria, nocturia,  HEMATOLOGY: No anemia, easy bruising or bleeding SKIN: Left leg redness/swelling  mUSCULOSKELETAL: No joint pain or arthritis.  Left leg pain NEUROLOGIC: No tingling, numbness, weakness.  PSYCHIATRY: No anxiety or depression.   MEDICATIONS AT HOME:  Prior to Admission medications   Medication Sig Start Date End Date Taking? Authorizing Provider  aspirin EC 81 MG tablet Take 81 mg by mouth daily. 03/05/16  Yes [provider]  ergocalciferol (VITAMIN D2) 50000 units capsule Take 1 capsule (50,000 Units total) by mouth once a week. 05/19/17  Yes Earlie Server, MD  ferrous sulfate 325 (65 FE) MG tablet Take 1 tablet (325 mg total) by mouth 3 (three) times daily with meals. 07/02/17  Yes Amiliah Campisi, Jordan Peace, MD  furosemide (LASIX) 20 MG tablet Take 1 tablet (20 mg total) by mouth daily. 07/02/17 07/02/18 Yes Brantley Naser, Jordan Peace, MD  pantoprazole (PROTONIX) 40 MG tablet Take 1 tablet (40 mg total) by mouth daily. 07/02/17 07/02/18 Yes Jacob Cicero, Jordan Peace, MD  potassium chloride SA (K-DUR,KLOR-CON) 20 MEQ tablet Take 2 tablets (40 mEq total) by mouth 2 (two) times daily. 07/02/17  Yes Saryna Kneeland, Jordan Peace, MD  spironolactone (ALDACTONE) 25 MG tablet Take 0.5 tablets (12.5 mg total) by mouth 2 (two) times daily.  07/02/17  Yes Jamori Biggar, Jordan Peace, MD  fluticasone (FLONASE) 50 MCG/ACT nasal spray Place 2 sprays into both nostrils daily. 07/02/17   Savaya Hakes, Holly Bodily D, MD  hydrOXYzine (ATARAX/VISTARIL) 25 MG tablet Take 25 mg by mouth 3 (three) times daily as needed. 04/01/17   [provider]  sodium chloride (OCEAN) 0.65 % SOLN nasal spray Place 1 spray into both nostrils as needed for congestion. 07/02/17    Jakaleb Payer, Jordan Peace, MD      PHYSICAL EXAMINATION:   VITAL SIGNS: Blood pressure 130/70, pulse 88, temperature 98.8 F (37.1 C), temperature source Oral, resp. rate 16, height 5\' 8"  (1.727 m), weight (!) 147 kg, SpO2 98 %.  GENERAL:  53 y.o.-year-old patient lying in the bed with no acute distress.  Morbidly obese EYES: Pupils equal, round, reactive to light and accommodation. No scleral icterus. Extraocular muscles intact.  HEENT: Head atraumatic, normocephalic. Oropharynx and nasopharynx clear.  NECK:  Supple, no jugular venous distention. No thyroid enlargement, no tenderness.  LUNGS: Normal breath sounds bilaterally, no wheezing, rales,rhonchi or crepitation. No use of accessory muscles of respiration.  CARDIOVASCULAR: S1, S2 normal. No murmurs, rubs, or gallops.  ABDOMEN: Soft, nontender, nondistended. Bowel sounds present. No organomegaly or mass.  EXTREMITIES: Left lower extremity swelling with diffuse cellulitis extending to the knee, no cyanosis, or clubbing.  NEUROLOGIC: Cranial nerves II through XII are intact. Muscle strength 5/5 in all extremities. Sensation intact. Gait not checked.  PSYCHIATRIC: The patient is alert and oriented x 3.  SKIN: No obvious rash, lesion, or ulcer.  Extensive left lower extremity cellulitis LABORATORY PANEL:   CBC Recent Labs  Lab 06/16/18 1117  WBC 8.4  HGB 13.0  HCT 38.0*  PLT 49*  MCV 100.3*  MCH 34.3*  MCHC 34.2  RDW 16.8*  LYMPHSABS 1.1  MONOABS 1.0  EOSABS 0.3  BASOSABS 0.0   ------------------------------------------------------------------------------------------------------------------  Chemistries  Recent Labs  Lab 06/16/18 1117  NA 137  K 4.4  CL 106  CO2 25  GLUCOSE 120*  BUN 19  CREATININE 1.15  CALCIUM 8.5*  AST 49*  ALT 31  ALKPHOS 128*  BILITOT 6.0*   ------------------------------------------------------------------------------------------------------------------ estimated creatinine clearance is 107.3  mL/min (by C-G formula based on SCr of 1.15 mg/dL). ------------------------------------------------------------------------------------------------------------------ No results for input(s): TSH, T4TOTAL, T3FREE, THYROIDAB in the last 72 hours.  Invalid input(s): FREET3   Coagulation profile No results for input(s): INR, PROTIME in the last 168 hours. ------------------------------------------------------------------------------------------------------------------- No results for input(s): DDIMER in the last 72 hours. -------------------------------------------------------------------------------------------------------------------  Cardiac Enzymes No results for input(s): CKMB, TROPONINI, MYOGLOBIN in the last 168 hours.  Invalid input(s): CK ------------------------------------------------------------------------------------------------------------------ Invalid input(s): POCBNP  ---------------------------------------------------------------------------------------------------------------  Urinalysis    Component Value Date/Time   COLORURINE YELLOW (A) 04/02/2018 1545   APPEARANCEUR CLEAR (A) 04/02/2018 1545   LABSPEC 1.008 04/02/2018 1545   PHURINE 6.0 04/02/2018 1545   GLUCOSEU NEGATIVE 04/02/2018 1545   HGBUR MODERATE (A) 04/02/2018 1545   BILIRUBINUR NEGATIVE 04/02/2018 1545   KETONESUR NEGATIVE 04/02/2018 1545   PROTEINUR NEGATIVE 04/02/2018 1545   NITRITE NEGATIVE 04/02/2018 1545   LEUKOCYTESUR NEGATIVE 04/02/2018 1545     RADIOLOGY: US Venous Img Lower Unilateral Left  Result Date: 06/16/2018 CLINICAL DATA:  Pain and swelling EXAM: LEFT LOWER EXTREMITY VENOUS DOPPLER ULTRASOUND TECHNIQUE: Gray-scale sonography with compression, as well as color and duplex ultrasound, were performed to evaluate the deep venous system from the level of the common femoral vein through the popliteal and proximal calf veins. Technologist describes technically difficult  study of the calf  veins secondary to Hayden habitus. COMPARISON:  None FINDINGS: Normal compressibility of the common femoral, superficial femoral, and popliteal veins, as well as the proximal calf veins. No filling defects to suggest DVT on grayscale or color Doppler imaging. Doppler waveforms show normal direction of venous flow, normal respiratory phasicity and response to augmentation. Morphologically unremarkable subcentimeter left inguinal lymph nodes incidentally noted. Survey views of the contralateral common femoral vein are unremarkable. IMPRESSION: No femoropopliteal and no calf DVT in the visualized calf veins. If clinical symptoms are inconsistent or if there are persistent or worsening symptoms, further imaging (possibly involving the iliac veins) may be warranted. Electronically Signed   By: Lucrezia Europe M.D.   On: 06/16/2018 13:58   Dg Chest Portable 1 View  Result Date: 06/16/2018 CLINICAL DATA:  Chest pain EXAM: PORTABLE CHEST 1 VIEW COMPARISON:  06/28/2017 FINDINGS: Cardiac shadow is mildly accentuated by the portable technique. Mild vascular congestion is noted without interstitial edema. Mild left basilar atelectasis is seen. No bony abnormality is noted. IMPRESSION: Mild left basilar atelectasis. Mild vascular congestion. Electronically Signed   By: Inez Catalina M.D.   On: 06/16/2018 12:31    EKG: Orders placed or performed during the hospital encounter of 04/02/18  . EKG 12-Lead  . EKG 12-Lead    IMPRESSION AND PLAN: *Acute extensive left lower extremity cellulitis Admit to regular nursing for bed on our cellulitis protocol, IV Ancef, follow-up on cultures  *History of alcoholism Will place on alcohol withdrawal protocol while in house  *Chronic liver cirrhosis secondary to alcohol abuse Stable Continue Lasix, spironolactone  *Chronic morbid obesity Most likely secondary to excess calories Lifestyle modification recommended  *Chronic GERD PPI daily  All the records are reviewed and  case discussed with ED provider. Management plans discussed with the patient, family and they are in agreement.  CODE STATUS:full Code Status History    Date Active Date Inactive Code Status Order ID Comments User Context   06/28/2017 1948 07/02/2017 1728 Full Code [id number]  Hillary Bow, MD ED   02/29/2016 1726 03/05/2016 1718 Full Code [id number]  Dustin Flock, MD ED       TOTAL TIME TAKING CARE OF THIS PATIENT: 40 minutes.    Jordan Hayden M.D on 06/16/2018   Between 7am to 6pm - Pager - [phone]  After 6pm go to [email or web address] - password [email or web address] [place] [facility]  Office  [phone]  CC: Primary care physician; Jordan Body, MD   Note: This dictation was prepared with Dragon dictation along with smaller phrase technology. Any transcriptional errors that result from this process are unintentional.

## 2018-06-16 NOTE — ED Notes (Signed)
ED TO INPATIENT HANDOFF REPORT  ED Nurse Name and Phone #: Jacques Navy  [phone]  S Name/Age/Gender Jordan Hayden 53 y.o. male Room/Bed: ED53A/ED53A  Code Status   Code Status: Prior  Home/SNF/Other Home Patient oriented to: self, place, time and situation Is this baseline yes  Triage Complete: Triage complete  Chief Complaint Left ankle swelling  Triage Note Pt to ED from home c/o left ankle swelling suddenly approx 1 week ago, denies SOB, states painful, denies injury.  Concerned may be gout but denies hx of gout.  Denies cardiac hx.  Left ankle appears red, mild pitting edema, warm to touch, red.  Pt respirations even and unlabored, in NAD at this time.   Allergies Allergies  Allergen Reactions  . Hydrocodone-Acetaminophen Swelling    Mouth Swelling     Level of Care/Admitting Diagnosis ED Disposition    ED Disposition Condition [facility] Area: [place] [[id number]]  Level of Care: Med-Surg [16]  Covid Evaluation: Confirmed COVID Negative  Diagnosis: Cellulitis [160109]  Admitting Physician: Gorden Harms [[id number]]  Attending Physician: Gorden Harms [[id number]]  Estimated length of stay: past midnight tomorrow  Certification:: I certify this patient will need inpatient services for at least 2 midnights  PT Class (Do Not Modify): Inpatient [101]  PT Acc Code (Do Not Modify): Private [1]       B Medical/Surgery History Past Medical History:  Diagnosis Date  . Alcohol abuse   . Alcoholic cirrhosis ([place])   . Alcoholism ([place])   . Arthritis   . Cirrhosis [facility])    Past Surgical History:  Procedure Laterality Date  . ESOPHAGOGASTRODUODENOSCOPY (EGD) WITH PROPOFOL N/A 07/01/2017   Procedure: ESOPHAGOGASTRODUODENOSCOPY (EGD) WITH PROPOFOL;  Surgeon: Lucilla Lame, MD;  Location: [facility] ENDOSCOPY;  Service: Endoscopy;  Laterality: N/A;     A IV Location/Drains/Wounds Patient Lines/Drains/Airways Status   Active  Line/Drains/Airways    Name:   Placement date:   Placement time:   Site:   Days:   Peripheral IV 06/16/18 Right Antecubital   06/16/18    1508    Antecubital   less than 1          Intake/Output Last 24 hours No intake or output data in the 24 hours ending 06/16/18 1645  Labs/Imaging Results for orders placed or performed during the hospital encounter of 06/16/18 (from the past 48 hour(s))  CBC with Differential     Status: Abnormal   Collection Time: 06/16/18 11:17 AM  Result Value Ref Range   WBC 8.4 4.0 - 10.5 K/uL   RBC 3.79 (L) 4.22 - 5.81 MIL/uL   Hemoglobin 13.0 13.0 - 17.0 g/dL   HCT 38.0 (L) 39.0 - 52.0 %   MCV 100.3 (H) 80.0 - 100.0 fL   MCH 34.3 (H) 26.0 - 34.0 pg   MCHC 34.2 30.0 - 36.0 g/dL   RDW 16.8 (H) 11.5 - 15.5 %   Platelets 49 (L) 150 - 400 K/uL    Comment: Immature Platelet Fraction may be clinically indicated, consider ordering this additional test [id number]    nRBC 0.0 0.0 - 0.2 %   Neutrophils Relative % 69 %   Neutro Abs 5.9 1.7 - 7.7 K/uL   Lymphocytes Relative 13 %   Lymphs Abs 1.1 0.7 - 4.0 K/uL   Monocytes Relative 12 %   Monocytes Absolute 1.0 0.1 - 1.0 K/uL   Eosinophils Relative 4 %   Eosinophils Absolute 0.3 0.0 - 0.5  K/uL   Basophils Relative 1 %   Basophils Absolute 0.0 0.0 - 0.1 K/uL   Immature Granulocytes 1 %   Abs Immature Granulocytes 0.06 0.00 - 0.07 K/uL    Comment: Performed at [facility], [address] [place], [place] [address]  Comprehensive metabolic panel     Status: Abnormal   Collection Time: 06/16/18 11:17 AM  Result Value Ref Range   Sodium 137 135 - 145 mmol/L   Potassium 4.4 3.5 - 5.1 mmol/L   Chloride 106 98 - 111 mmol/L   CO2 25 22 - 32 mmol/L   Glucose, Bld 120 (H) 70 - 99 mg/dL   BUN 19 6 - 20 mg/dL   Creatinine, Ser 1.15 0.61 - 1.24 mg/dL   Calcium 8.5 (L) 8.9 - 10.3 mg/dL   Total Protein 6.3 (L) 6.5 - 8.1 g/dL   Albumin 2.5 (L) 3.5 - 5.0 g/dL   AST 49 (H) 15 - 41 U/L   ALT 31 0 - 44 U/L    Alkaline Phosphatase 128 (H) 38 - 126 U/L   Total Bilirubin 6.0 (H) 0.3 - 1.2 mg/dL   GFR calc non Af Amer >60 >60 mL/min   GFR calc Af Amer >60 >60 mL/min   Anion gap 6 5 - 15    Comment: Performed at [facility], [address] [place], [place] [address]  Sedimentation rate     Status: Abnormal   Collection Time: 06/16/18 11:17 AM  Result Value Ref Range   Sed Rate 24 (H) 0 - 20 mm/hr    Comment: Performed at [facility], [address], [place], [place] [address]  Uric acid     Status: None   Collection Time: 06/16/18 11:17 AM  Result Value Ref Range   Uric Acid, Serum 5.1 3.7 - 8.6 mg/dL    Comment: ICTERUS AT THIS LEVEL MAY AFFECT RESULT Performed at [facility], [address] [place], [place] [address]   Brain natriuretic peptide     Status: None   Collection Time: 06/16/18 12:32 PM  Result Value Ref Range   B Natriuretic Peptide 97.0 0.0 - 100.0 pg/mL    Comment: Performed at [facility], [address] [place], [place] [address]  Lactic acid, plasma     Status: None   Collection Time: 06/16/18 12:32 PM  Result Value Ref Range   Lactic Acid, Venous 1.1 0.5 - 1.9 mmol/L    Comment: Performed at [facility], [address] [place], [place] [address]   US Venous Img Lower Unilateral Left  Result Date: 06/16/2018 CLINICAL DATA:  Pain and swelling EXAM: LEFT LOWER EXTREMITY VENOUS DOPPLER ULTRASOUND TECHNIQUE: Gray-scale sonography with compression, as well as color and duplex ultrasound, were performed to evaluate the deep venous system from the level of the common femoral vein through the popliteal and proximal calf veins. Technologist describes technically difficult study of the calf veins secondary to body habitus. COMPARISON:  None FINDINGS: Normal compressibility of the common femoral, superficial femoral, and popliteal veins, as well as the proximal calf veins. No filling defects to suggest DVT on grayscale or color  Doppler imaging. Doppler waveforms show normal direction of venous flow, normal respiratory phasicity and response to augmentation. Morphologically unremarkable subcentimeter left inguinal lymph nodes incidentally noted. Survey views of the contralateral common femoral vein are unremarkable. IMPRESSION: No femoropopliteal and no calf DVT in the visualized calf veins. If clinical symptoms are inconsistent or if there are persistent  or worsening symptoms, further imaging (possibly involving the iliac veins) may be warranted. Electronically Signed   By: Lucrezia Europe M.D.   On: 06/16/2018 13:58   Dg Chest Portable 1 View  Result Date: 06/16/2018 CLINICAL DATA:  Chest pain EXAM: PORTABLE CHEST 1 VIEW COMPARISON:  06/28/2017 FINDINGS: Cardiac shadow is mildly accentuated by the portable technique. Mild vascular congestion is noted without interstitial edema. Mild left basilar atelectasis is seen. No bony abnormality is noted. IMPRESSION: Mild left basilar atelectasis. Mild vascular congestion. Electronically Signed   By: Inez Catalina M.D.   On: 06/16/2018 12:31    Pending Labs Unresulted Labs (From admission, onward)    Start     Ordered   06/16/18 1521  SARS Coronavirus 2 (CEPHEID - Performed in [facility] hospital lab), [facility] Order  Once,   STAT    Question:  Rule Out  Answer:  Yes   06/16/18 1520   06/16/18 1240  Lactic acid, plasma  Now then every 2 hours,   STAT     06/16/18 1239   Signed and Held  CBC  (enoxaparin (LOVENOX)    CrCl >/= 30 ml/min)  Once,   R    Comments:  Baseline for enoxaparin therapy IF NOT ALREADY DRAWN.  Notify MD if PLT < 100 K.    Signed and Held   Signed and Held  Creatinine, serum  (enoxaparin (LOVENOX)    CrCl >/= 30 ml/min)  Once,   R    Comments:  Baseline for enoxaparin therapy IF NOT ALREADY DRAWN.    Signed and Held   Signed and Held  Creatinine, serum  (enoxaparin (LOVENOX)    CrCl >/= 30 ml/min)  Weekly,   R    Comments:  while on enoxaparin therapy    Signed  and Held          Vitals/Pain Today's Vitals   06/16/18 1110 06/16/18 1113 06/16/18 1444  BP: 126/74  130/70  Pulse: 93  88  Resp: 16  16  Temp: 98.8 F (37.1 C)    TempSrc: Oral    SpO2: 98%  98%  Weight:  (!) 147 kg   Height:  5\' 8"  (1.727 m)   PainSc:  6  4     Isolation Precautions No active isolations  Medications Medications  cefTRIAXone (ROCEPHIN) 1 g in sodium chloride 0.9 % 100 mL IVPB (0 g Intravenous Stopped 06/16/18 1639)    Mobility walks Low fall risk   Focused Assessments    R Recommendations: See Admitting Provider Note  Report given to: Annabella RN  Additional Notes: n/a

## 2018-06-16 NOTE — ED Notes (Signed)
Dr Cherylann Banas in with pt

## 2018-06-16 NOTE — ED Triage Notes (Signed)
Pt to ED from home c/o left ankle swelling suddenly approx 1 week ago, denies SOB, states painful, denies injury.  Concerned may be gout but denies hx of gout.  Denies cardiac hx.  Left ankle appears red, mild pitting edema, warm to touch, red.  Pt respirations even and unlabored, in NAD at this time.

## 2018-06-16 NOTE — Progress Notes (Signed)
Family Meeting Note  Advance Directive:yes  Today a meeting took place with the Patient.  Patient is able to participate   The following clinical team members were present during this meeting:MD  The following were discussed:Patient's diagnosis5 y.o. male with a known history history per below, presents emergency room with 1 week history of worsening left leg/ankle swelling associated redness, pain with standing/ambulation, in the emergency room patient was noted to have cellulitis on examination, lower extremity Doppler was negative for DVT, hospitalist asked to admit, patient evaluated emergency room, no apparent distress, resting comfortably in bed, patient admitted for acute left lower extremity cellulitis, Patient's progosis: Unable to determine and Goals for treatment: Full Code  Additional follow-up to be provided: prn  Time spent during discussion:20 minutes  Gorden Harms, MD

## 2018-06-17 LAB — BASIC METABOLIC PANEL
Anion gap: 5 (ref 5–15)
BUN: 18 mg/dL (ref 6–20)
CO2: 24 mmol/L (ref 22–32)
Calcium: 8 mg/dL — ABNORMAL LOW (ref 8.9–10.3)
Chloride: 108 mmol/L (ref 98–111)
Creatinine, Ser: 0.87 mg/dL (ref 0.61–1.24)
GFR calc Af Amer: >60 mL/min (ref >60)
GFR calc non Af Amer: >60 mL/min (ref >60)
Glucose, Bld: 80 mg/dL (ref 70–99)
Potassium: 4.3 mmol/L (ref 3.5–5.1)
Sodium: 137 mmol/L (ref 135–145)

## 2018-06-17 MED ORDER — ALBUTEROL SULFATE (2.5 MG/3ML) 0.083% IN NEBU: 2.5000 mg | INHALATION_SOLUTION | Freq: Four times a day (QID) | RESPIRATORY_TRACT | Status: DC | PRN

## 2018-06-17 NOTE — Progress Notes (Signed)
Per Respiratory Protocol, Patient scored level 1, frequency changed to PRN Q6. Patient SAT 97% on Room Air. No distress noted. Will continue to monitor.

## 2018-06-17 NOTE — Progress Notes (Signed)
[facility] [place] NAME: Jordan Hayden    MR#:  [id number]  DATE OF BIRTH:  August 23, 1965  SUBJECTIVE: Admitted for left leg cellulitis, on Ancef, decreased pain, redness.  CHIEF COMPLAINT:   Chief Complaint  Patient presents with  . Joint Swelling    REVIEW OF SYSTEMS:   ROS CONSTITUTIONAL: No fever, fatigue or weakness.  EYES: No blurred or double vision.  EARS, NOSE, AND THROAT: No tinnitus or ear pain.  RESPIRATORY: No cough, shortness of breath, wheezing or hemoptysis.  CARDIOVASCULAR: No chest pain, orthopnea, edema.  GASTROINTESTINAL: No nausea, vomiting, diarrhea or abdominal pain.  GENITOURINARY: No dysuria, hematuria.  ENDOCRINE: No polyuria, nocturia,  HEMATOLOGY: No anemia, easy bruising or bleeding SKIN: No rash or lesion. MUSCULOSKELETAL: Decreased left leg pain, redness. NEUROLOGIC: No tingling, numbness, weakness.  PSYCHIATRY: No anxiety or depression.   DRUG ALLERGIES:   Allergies  Allergen Reactions  . Hydrocodone-Acetaminophen Swelling    Mouth Swelling     VITALS:  Blood pressure 132/72, pulse 96, temperature 98.6 F (37 C), temperature source Oral, resp. rate 16, height 5\' 8"  (1.727 m), weight (!) 147.2 kg, SpO2 96 %.  PHYSICAL EXAMINATION:  GENERAL:  53 y.o.-year-old patient lying in the bed with no acute distress.  EYES: Pupils equal, round, reactive to light and accommodation. No scleral icterus. Extraocular muscles intact.  HEENT: Head atraumatic, normocephalic. Oropharynx and nasopharynx clear.  NECK:  Supple, no jugular venous distention. No thyroid enlargement, no tenderness.  LUNGS: Normal breath sounds bilaterally, no wheezing, rales,rhonchi or crepitation. No use of accessory muscles of respiration.  CARDIOVASCULAR: S1, S2 normal. No murmurs, rubs, or gallops.  ABDOMEN: Soft, nontender, nondistended. Bowel sounds present. No organomegaly or mass.  EXTREMITIES: Left leg examination  shows no tenderness, no erythema and he says he feels better NEUROLOGIC: Cranial nerves II through XII are intact. Muscle strength 5/5 in all extremities. Sensation intact. Gait not checked.  PSYCHIATRIC: The patient is alert and oriented x 3.  SKIN: No obvious rash, lesion, or ulcer.    LABORATORY PANEL:   CBC Recent Labs  Lab 06/16/18 1117  WBC 8.4  HGB 13.0  HCT 38.0*  PLT 49*   ------------------------------------------------------------------------------------------------------------------  Chemistries  Recent Labs  Lab 06/16/18 1117 06/17/18 0750  NA 137 137  K 4.4 4.3  CL 106 108  CO2 25 24  GLUCOSE 120* 80  BUN 19 18  CREATININE 1.15 0.87  CALCIUM 8.5* 8.0*  AST 49*  --   ALT 31  --   ALKPHOS 128*  --   BILITOT 6.0*  --    ------------------------------------------------------------------------------------------------------------------  Cardiac Enzymes No results for input(s): TROPONINI in the last 168 hours. ------------------------------------------------------------------------------------------------------------------  RADIOLOGY:  US Venous Img Lower Unilateral Left  Result Date: 06/16/2018 CLINICAL DATA:  Pain and swelling EXAM: LEFT LOWER EXTREMITY VENOUS DOPPLER ULTRASOUND TECHNIQUE: Gray-scale sonography with compression, as well as color and duplex ultrasound, were performed to evaluate the deep venous system from the level of the common femoral vein through the popliteal and proximal calf veins. Technologist describes technically difficult study of the calf veins secondary to body habitus. COMPARISON:  None FINDINGS: Normal compressibility of the common femoral, superficial femoral, and popliteal veins, as well as the proximal calf veins. No filling defects to suggest DVT on grayscale or color Doppler imaging. Doppler waveforms show normal direction of venous flow, normal respiratory phasicity and response to augmentation. Morphologically unremarkable  subcentimeter left inguinal lymph nodes incidentally noted.  Survey views of the contralateral common femoral vein are unremarkable. IMPRESSION: No femoropopliteal and no calf DVT in the visualized calf veins. If clinical symptoms are inconsistent or if there are persistent or worsening symptoms, further imaging (possibly involving the iliac veins) may be warranted. Electronically Signed   By: Lucrezia Europe M.D.   On: 06/16/2018 13:58   Dg Chest Portable 1 View  Result Date: 06/16/2018 CLINICAL DATA:  Chest pain EXAM: PORTABLE CHEST 1 VIEW COMPARISON:  06/28/2017 FINDINGS: Cardiac shadow is mildly accentuated by the portable technique. Mild vascular congestion is noted without interstitial edema. Mild left basilar atelectasis is seen. No bony abnormality is noted. IMPRESSION: Mild left basilar atelectasis. Mild vascular congestion. Electronically Signed   By: Inez Catalina M.D.   On: 06/16/2018 12:31    EKG:   Orders placed or performed during the hospital encounter of 04/02/18  . EKG 12-Lead  . EKG 12-Lead    ASSESSMENT AND PLAN:  53 year old male patient with alcohol abuse, alcoholic liver cirrhosis admitted for left leg cellulitis  #1 left leg cellulitis, on IV Ancef, ultrasound of left leg did not show DVT.  Continue IV Ancef for today, discharge home tomorrow with Keflex for 5 days.  #2. alcoholic liver cirrhosis patient on Lasix, Aldactone.  Likely discharge home tomorrow. All the records are reviewed and case discussed with Care Management/Social Workerr. Management plans discussed with the patient, family and they are in agreement.  CODE STATUS: full code  TOTAL TIME TAKING CARE OF THIS PATIENT: 35 minutes.      Epifanio Lesches M.D on 06/17/2018 at 1:15 PM  Between 7am to 6pm - Pager - [phone]  After 6pm go to [email or web address] - password [email or web address] [facility]  Office  [phone]  CC: Primary care physician; Koua Body, MD   Note: This  dictation was prepared with Dragon dictation along with smaller phrase technology. Any transcriptional errors that result from this process are unintentional.

## 2018-06-18 MED ORDER — CEPHALEXIN 500 MG PO CAPS: 500.0000 mg | ORAL_CAPSULE | Freq: Two times a day (BID) | ORAL | 0 refills | Status: AC

## 2018-06-18 NOTE — Discharge Summary (Signed)
Jordan Hayden, is a 53 y.o. male  DOB [birth year]  MRN [id number].  Admission date:  06/16/2018  Admitting Physician  Gorden Harms, MD  Discharge Date:  06/18/2018   Primary MD  Robben Body, MD  Recommendations for primary care physician for things to follow:   Follow with PCP in 1 week   Admission Diagnosis  Cellulitis of left lower extremity [L03.116]   Discharge Diagnosis  Cellulitis of left lower extremity [L03.116]    Active Problems:   Cellulitis      Past Medical History:  Diagnosis Date  . Alcohol abuse   . Alcoholic cirrhosis ([place])   . Alcoholism ([place])   . Arthritis   . Cirrhosis [facility])     Past Surgical History:  Procedure Laterality Date  . ESOPHAGOGASTRODUODENOSCOPY (EGD) WITH PROPOFOL N/A 07/01/2017   Procedure: ESOPHAGOGASTRODUODENOSCOPY (EGD) WITH PROPOFOL;  Surgeon: Lucilla Lame, MD;  Location: [facility] ENDOSCOPY;  Service: Endoscopy;  Laterality: N/A;       History of present illness and  Hospital Course:     Kindly see H&P for history of present illness and admission details, please review complete Labs, Consult reports and Test reports for all details in brief  HPI  from the history and physical done on the day of admission 53 year old male with history of liver cirrhosis admitted because of right leg cellulitis.   Hospital Course  Right leg cellulitis, improved with IV Ancef, discharged home with Keflex 500 mg p.o. twice daily for 5 days.  Patient had no fever, DVT ultrasound did not show DVT.  WBC 8.4. 2.  Alcoholic liver cirrhosis, patient already on Lasix, Aldactone, continue them.  Patient hemodynamically stable without any hypoxia or abdominal swelling suggestive of ascites.     Discharge Condition: Stable   Follow UP  Follow-up Information    Yosiah Body,  MD. Go on 06/27/2018.   Specialty:  Family Medicine Why:  appointment @ 11:30 Contact information: [address] [id number] [phone]             Discharge Instructions  and  Discharge Medication   Allergies as of 06/18/2018      Reactions   Hydrocodone-acetaminophen Swelling   Mouth Swelling      Medication List    TAKE these medications   aspirin EC 81 MG tablet Take 81 mg by mouth daily. Notes to patient:  Morning 06/19/18   cephALEXin 500 MG capsule Commonly known as:  KEFLEX Take 1 capsule (500 mg total) by mouth 2 (two) times daily for 5 days. Notes to patient:  Upon return home 06/18/18   ergocalciferol 1.25 MG (50000 UT) capsule Commonly known as:  VITAMIN D2 Take 1 capsule (50,000 Units total) by mouth once a week. Notes to patient:  According to home schedule   ferrous sulfate 325 (65 FE) MG tablet Take 1 tablet (325 mg total) by mouth 3 (three) times daily with meals. Notes to patient:  With evening meal 06/18/18   fluticasone 50 MCG/ACT nasal spray Commonly known as:  FLONASE Place 2 sprays into both nostrils daily. Notes to patient:  Morning 06/19/18   furosemide 20 MG tablet Commonly known as:  Lasix Take 1 tablet (20 mg total) by mouth daily. Notes to patient:  Morning 06/19/18   hydrOXYzine 25 MG tablet Commonly known as:  ATARAX/VISTARIL Take 25 mg by mouth 3 (three) times daily as needed. Notes to patient:  As needed   pantoprazole 40 MG tablet  Commonly known as:  Protonix Take 1 tablet (40 mg total) by mouth daily. Notes to patient:  Morning 06/19/18   potassium chloride SA 20 MEQ tablet Commonly known as:  K-DUR Take 2 tablets (40 mEq total) by mouth 2 (two) times daily. Notes to patient:  Evening 06/18/18   sodium chloride 0.65 % Soln nasal spray Commonly known as:  OCEAN Place 1 spray into both nostrils as needed for congestion. Notes to patient:  As needed   spironolactone 25 MG  tablet Commonly known as:  ALDACTONE Take 0.5 tablets (12.5 mg total) by mouth 2 (two) times daily. Notes to patient:  Before bed 06/18/18         Diet and Activity recommendation: See Discharge Instructions above   Consults obtained -none   Major procedures and Radiology Reports - PLEASE review detailed and final reports for all details, in brief -      Dg Hand 2 View Right  Result Date: 05/30/2018 CLINICAL DATA:  Chronic right hand pain. EXAM: RIGHT HAND - 2 VIEW COMPARISON:  Right hand x-rays dated October 25, 2010. FINDINGS: No acute fracture or dislocation. Flexion deformities of the fourth and fifth PIP joints. Joint spaces are preserved. Mild osteopenia. Soft tissues are unremarkable. IMPRESSION: 1. No acute osseous abnormality or significant degenerative changes. 2. Flexion deformities of the fourth and fifth PIP joints. Electronically Signed   By: Titus Dubin M.D.   On: 05/30/2018 16:04   US Venous Img Lower Unilateral Left  Result Date: 06/16/2018 CLINICAL DATA:  Pain and swelling EXAM: LEFT LOWER EXTREMITY VENOUS DOPPLER ULTRASOUND TECHNIQUE: Gray-scale sonography with compression, as well as color and duplex ultrasound, were performed to evaluate the deep venous system from the level of the common femoral vein through the popliteal and proximal calf veins. Technologist describes technically difficult study of the calf veins secondary to body habitus. COMPARISON:  None FINDINGS: Normal compressibility of the common femoral, superficial femoral, and popliteal veins, as well as the proximal calf veins. No filling defects to suggest DVT on grayscale or color Doppler imaging. Doppler waveforms show normal direction of venous flow, normal respiratory phasicity and response to augmentation. Morphologically unremarkable subcentimeter left inguinal lymph nodes incidentally noted. Survey views of the contralateral common femoral vein are unremarkable. IMPRESSION: No femoropopliteal and  no calf DVT in the visualized calf veins. If clinical symptoms are inconsistent or if there are persistent or worsening symptoms, further imaging (possibly involving the iliac veins) may be warranted. Electronically Signed   By: Lucrezia Europe M.D.   On: 06/16/2018 13:58   Dg Chest Portable 1 View  Result Date: 06/16/2018 CLINICAL DATA:  Chest pain EXAM: PORTABLE CHEST 1 VIEW COMPARISON:  06/28/2017 FINDINGS: Cardiac shadow is mildly accentuated by the portable technique. Mild vascular congestion is noted without interstitial edema. Mild left basilar atelectasis is seen. No bony abnormality is noted. IMPRESSION: Mild left basilar atelectasis. Mild vascular congestion. Electronically Signed   By: Inez Catalina M.D.   On: 06/16/2018 12:31    Micro Results     Recent Results (from the past 240 hour(s))  SARS Coronavirus 2 (CEPHEID - Performed in [place] hospital lab), Hosp Order     Status: None   Collection Time: 06/16/18  3:28 PM  Result Value Ref Range Status   SARS Coronavirus 2 NEGATIVE NEGATIVE Final    Comment: (NOTE) If result is NEGATIVE SARS-CoV-2 target nucleic acids are NOT DETECTED. The SARS-CoV-2 RNA is generally detectable in upper and lower  respiratory  specimens during the acute phase of infection. The lowest  concentration of SARS-CoV-2 viral copies this assay can detect is 250  copies / mL. A negative result does not preclude SARS-CoV-2 infection  and should not be used as the sole basis for treatment or other  patient management decisions.  A negative result may occur with  improper specimen collection / handling, submission of specimen other  than nasopharyngeal swab, presence of viral mutation(s) within the  areas targeted by this assay, and inadequate number of viral copies  (<250 copies / mL). A negative result must be combined with clinical  observations, patient history, and epidemiological information. If result is POSITIVE SARS-CoV-2 target nucleic acids are  DETECTED. The SARS-CoV-2 RNA is generally detectable in upper and lower  respiratory specimens dur ing the acute phase of infection.  Positive  results are indicative of active infection with SARS-CoV-2.  Clinical  correlation with patient history and other diagnostic information is  necessary to determine patient infection status.  Positive results do  not rule out bacterial infection or co-infection with other viruses. If result is PRESUMPTIVE POSTIVE SARS-CoV-2 nucleic acids MAY BE PRESENT.   A presumptive positive result was obtained on the submitted specimen  and confirmed on repeat testing.  While 2019 novel coronavirus  (SARS-CoV-2) nucleic acids may be present in the submitted sample  additional confirmatory testing may be necessary for epidemiological  and / or clinical management purposes  to differentiate between  SARS-CoV-2 and other Sarbecovirus currently known to infect humans.  If clinically indicated additional testing with an alternate test  methodology [phone]) is advised. The SARS-CoV-2 RNA is generally  detectable in upper and lower respiratory sp ecimens during the acute  phase of infection. The expected result is Negative. Fact Sheet for Patients:  StrictlyIdeas.no Fact Sheet for Healthcare Providers: BankingDealers.co.za This test is not yet approved or cleared by the [place] FDA and has been authorized for detection and/or diagnosis of SARS-CoV-2 by FDA under an Emergency Use Authorization (EUA).  This EUA will remain in effect (meaning this test can be used) for the duration of the COVID-19 declaration under Section 564(b)(1) of the Act, 21 U.S.C. section 360bbb-3(b)(1), unless the authorization is terminated or revoked sooner. Performed at [facility], [address] [place], [place] [address]        Today   Subjective:   Anan Dapolito today has no headache,no chest abdominal  pain,no new weakness tingling or numbness, feels much better wants to go home today.   Objective:   Blood pressure (!) 143/83, pulse 95, temperature 98.3 F (36.8 C), temperature source Oral, resp. rate 18, height 5\' 8"  (1.727 m), weight (!) 144.3 kg, SpO2 96 %.   Intake/Output Summary (Last 24 hours) at 06/18/2018 1132 Last data filed at 06/18/2018 0900 Gross per 24 hour  Intake 843.65 ml  Output -  Net 843.65 ml    Exam Awake Alert, Oriented x 3, No new F.N deficits, Normal affect [place].AT,PERRAL Supple Neck,No JVD, No cervical lymphadenopathy appriciated.  Symmetrical Chest wall movement, Good air movement bilaterally, CTAB RRR,No Gallops,Rubs or new Murmurs, No Parasternal Heave +ve B.Sounds, Abd Soft, Non tender, No organomegaly appriciated, No rebound -guarding or rigidity. No Cyanosis, Clubbing or edema, No new Rash or bruise  Data Review   CBC w Diff:  Lab Results  Component Value Date   WBC 8.4 06/16/2018   HGB 13.0 06/16/2018   HCT 38.0 (L) 06/16/2018   PLT 49 (L) 06/16/2018  LYMPHOPCT 13 06/16/2018   MONOPCT 12 06/16/2018   EOSPCT 4 06/16/2018   BASOPCT 1 06/16/2018    CMP:  Lab Results  Component Value Date   NA 137 06/17/2018   K 4.3 06/17/2018   CL 108 06/17/2018   CO2 24 06/17/2018   BUN 18 06/17/2018   CREATININE 0.87 06/17/2018   PROT 6.3 (L) 06/16/2018   ALBUMIN 2.5 (L) 06/16/2018   BILITOT 6.0 (H) 06/16/2018   ALKPHOS 128 (H) 06/16/2018   AST 49 (H) 06/16/2018   ALT 31 06/16/2018  .   Total Time in preparing paper work, data evaluation and todays exam - 35 minutes  Epifanio Lesches M.D on 06/18/2018 at 11:32 AM    Note: This dictation was prepared with Dragon dictation along with smaller phrase technology. Any transcriptional errors that result from this process are unintentional.

## 2018-06-30 ENCOUNTER — Ambulatory Visit: Admission: RE | Admit: 2018-06-30 | Payer: Medicaid Other | Source: Ambulatory Visit

## 2018-07-22 ENCOUNTER — Emergency Department
Admission: EM | Admit: 2018-07-22 | Discharge: 2018-07-22 | Disposition: A | Discharge status: Home / Self Care | Payer: Medicaid Other | Attending: Emergency Medicine | Admitting: Emergency Medicine

## 2018-07-22 ENCOUNTER — Other Ambulatory Visit: Payer: Self-pay

## 2018-07-22 ENCOUNTER — Emergency Department: Payer: Medicaid Other

## 2018-07-22 DIAGNOSIS — Z79899 Other long term (current) drug therapy: Secondary | ICD-10-CM | POA: Diagnosis not present

## 2018-07-22 DIAGNOSIS — R509 Fever, unspecified: Secondary | ICD-10-CM | POA: Diagnosis not present

## 2018-07-22 DIAGNOSIS — Z20828 Contact with and (suspected) exposure to other viral communicable diseases: Secondary | ICD-10-CM | POA: Diagnosis not present

## 2018-07-22 DIAGNOSIS — M5416 Radiculopathy, lumbar region: Secondary | ICD-10-CM | POA: Insufficient documentation

## 2018-07-22 DIAGNOSIS — Z7982 Long term (current) use of aspirin: Secondary | ICD-10-CM | POA: Diagnosis not present

## 2018-07-22 DIAGNOSIS — M25551 Pain in right hip: Secondary | ICD-10-CM | POA: Diagnosis present

## 2018-07-22 LAB — CBC WITH DIFFERENTIAL/PLATELET
Abs Immature Granulocytes: 0.08 10*3/uL — ABNORMAL HIGH (ref 0.00–0.07)
Basophils Absolute: 0.1 10*3/uL (ref 0.0–0.1)
Basophils Relative: 0 %
Eosinophils Absolute: 0 10*3/uL (ref 0.0–0.5)
Eosinophils Relative: 0 %
HCT: 37.2 % — ABNORMAL LOW (ref 39.0–52.0)
Hemoglobin: 12.6 g/dL — ABNORMAL LOW (ref 13.0–17.0)
Immature Granulocytes: 1 %
Lymphocytes Relative: 6 %
Lymphs Abs: 0.8 10*3/uL (ref 0.7–4.0)
MCH: 34.2 pg — ABNORMAL HIGH (ref 26.0–34.0)
MCHC: 33.9 g/dL (ref 30.0–36.0)
MCV: 101.1 fL — ABNORMAL HIGH (ref 80.0–100.0)
Monocytes Absolute: 0.6 10*3/uL (ref 0.1–1.0)
Monocytes Relative: 5 %
Neutro Abs: 11.9 10*3/uL — ABNORMAL HIGH (ref 1.7–7.7)
Neutrophils Relative %: 88 %
Platelets: 82 10*3/uL — ABNORMAL LOW (ref 150–400)
RBC: 3.68 MIL/uL — ABNORMAL LOW (ref 4.22–5.81)
RDW: 16.2 % — ABNORMAL HIGH (ref 11.5–15.5)
WBC: 13.5 10*3/uL — ABNORMAL HIGH (ref 4.0–10.5)
nRBC: 0 % (ref 0.0–0.2)

## 2018-07-22 LAB — COMPREHENSIVE METABOLIC PANEL
ALT: 29 U/L (ref 0–44)
AST: 59 U/L — ABNORMAL HIGH (ref 15–41)
Albumin: 2.3 g/dL — ABNORMAL LOW (ref 3.5–5.0)
Alkaline Phosphatase: 169 U/L — ABNORMAL HIGH (ref 38–126)
Anion gap: 7 (ref 5–15)
BUN: 15 mg/dL (ref 6–20)
CO2: 24 mmol/L (ref 22–32)
Calcium: 8 mg/dL — ABNORMAL LOW (ref 8.9–10.3)
Chloride: 103 mmol/L (ref 98–111)
Creatinine, Ser: 1.13 mg/dL (ref 0.61–1.24)
GFR calc Af Amer: >60 mL/min (ref >60)
GFR calc non Af Amer: >60 mL/min (ref >60)
Glucose, Bld: 95 mg/dL (ref 70–99)
Potassium: 5 mmol/L (ref 3.5–5.1)
Sodium: 134 mmol/L — ABNORMAL LOW (ref 135–145)
Total Bilirubin: 3.9 mg/dL — ABNORMAL HIGH (ref 0.3–1.2)
Total Protein: 7.4 g/dL (ref 6.5–8.1)

## 2018-07-22 LAB — URINALYSIS, COMPLETE (UACMP) WITH MICROSCOPIC
Bacteria, UA: NONE SEEN
Bilirubin Urine: NEGATIVE
Glucose, UA: NEGATIVE mg/dL
Ketones, ur: NEGATIVE mg/dL
Leukocytes,Ua: NEGATIVE
Nitrite: NEGATIVE
Protein, ur: NEGATIVE mg/dL
Specific Gravity, Urine: 1.02 (ref 1.005–1.030)
pH: 5 (ref 5.0–8.0)

## 2018-07-22 LAB — SARS CORONAVIRUS 2 BY RT PCR (HOSPITAL ORDER, PERFORMED IN ~~LOC~~ HOSPITAL LAB): SARS Coronavirus 2: NEGATIVE

## 2018-07-22 LAB — LACTIC ACID, PLASMA: Lactic Acid, Venous: 1.5 mmol/L (ref 0.5–1.9)

## 2018-07-22 MED ORDER — METHOCARBAMOL 500 MG PO TABS: 500.0000 mg | ORAL_TABLET | Freq: Four times a day (QID) | ORAL | 0 refills | Status: DC

## 2018-07-22 MED ORDER — MELOXICAM 15 MG PO TABS: 15.0000 mg | ORAL_TABLET | Freq: Every day | ORAL | 0 refills | Status: AC

## 2018-07-22 MED ORDER — VANCOMYCIN HCL IN DEXTROSE 1-5 GM/200ML-% IV SOLN
1000.0000 mg | Freq: Once | INTRAVENOUS | Status: AC
  Administered 2018-07-22: 17:00:00 1000 mg via INTRAVENOUS
  Filled 2018-07-22: qty 200

## 2018-07-22 MED ORDER — MORPHINE SULFATE (PF) 4 MG/ML IV SOLN
4.0000 mg | Freq: Once | INTRAVENOUS | Status: AC
  Administered 2018-07-22: 4 mg via INTRAVENOUS
  Filled 2018-07-22: qty 1

## 2018-07-22 MED ORDER — OXYCODONE HCL 5 MG PO TABS: 5.0000 mg | ORAL_TABLET | Freq: Three times a day (TID) | ORAL | 0 refills | Status: DC | PRN

## 2018-07-22 MED ORDER — ONDANSETRON HCL 4 MG/2ML IJ SOLN
4.0000 mg | Freq: Once | INTRAMUSCULAR | Status: AC
  Administered 2018-07-22: 16:00:00 4 mg via INTRAVENOUS
  Filled 2018-07-22: qty 2

## 2018-07-22 MED ORDER — SODIUM CHLORIDE 0.9 % IV SOLN
1.0000 g | Freq: Once | INTRAVENOUS | Status: AC
  Administered 2018-07-22: 17:00:00 1 g via INTRAVENOUS
  Filled 2018-07-22: qty 10

## 2018-07-22 MED ORDER — IBUPROFEN 600 MG PO TABS
600.0000 mg | ORAL_TABLET | Freq: Once | ORAL | Status: AC
  Administered 2018-07-22: 16:00:00 600 mg via ORAL
  Filled 2018-07-22: qty 1

## 2018-07-22 MED ORDER — SODIUM CHLORIDE 0.9 % IV BOLUS
1000.0000 mL | Freq: Once | INTRAVENOUS | Status: AC
  Administered 2018-07-22: 16:00:00 1000 mL via INTRAVENOUS

## 2018-07-22 NOTE — ED Triage Notes (Signed)
Pt arrives to ED from home via [facility] with c/c of acute R hip pain. Pt reports Sx onset this morning and denies any specific MoI or Hx of similar Sx. Pt has been Tx for chronic low back pain with oral NSAIDS and has Hx of cirrhosis. Pt went to bed last night and felt fine but woke this morning with 8/10 hip pain and was unable to stand or bear weight without assistance. EMS reports transport vitals of 153/79, p78, O2 sats 97% on room air. Upon arrival, pt A&Ox4, NAD, no respiratory Sx noted.

## 2018-07-22 NOTE — ED Notes (Signed)
Oral temp 103.1 [name] notified.

## 2018-07-22 NOTE — Discharge Instructions (Signed)
Do not take the Robaxin and oxycodone at the same time.  Combining both the pain medication and the muscle relaxer will be overly sedating.  Please return to the emergency department for any new or worsening symptoms, specifically any infectious symptoms to include nasal congestion, sore throat, cough, shortness of breath, worsening fevers, abdominal pain, nausea vomiting, diarrhea, painful urination, urinary frequency.

## 2018-07-22 NOTE — ED Provider Notes (Signed)
[facility] Emergency Department Provider Note  ____________________________________________  Time seen: Approximately 2:59 PM  I have reviewed the triage vital signs and the nursing notes.   HISTORY  Chief Complaint Hip Pain (right side)    HPI Jordan Hayden is a 53 y.o. male who presents the emergency department via EMS from home complaining primarily of severe right hip pain.  Patient reports that he does have a history of lumbar radiculopathy, and frequently does have radicular pain symptoms.  Patient reports however this is worse than normal.  All pain is located in the right posterior lateral hip.  Patient reports that it does not hurt while at rest but any weightbearing drastically increases the pain.  No numbness or tingling in the right leg or foot.  Patient denies any back pain.  No bowel bladder dysfunction, saddle anesthesia or paresthesias.  Patient denies any other complaints at this time.  During interview, patient is covering himself with blanket, teeth are chattering and patient is shivering.  When asked why leg patient is shivering, he reports that he feels very chilled at this time.  Of note, patient does have a temperature of 100.3 F on arrival.  Patient does have a history of sepsis.  Patient currently denies any fever at home, nasal congestion, sore throat, cough, abdominal pain, nausea vomiting, diarrhea, constipation, dysuria, polyuria, hematuria.  Patient denies any sources of infection at this time.   Patient does have a history of alcohol abuse with alcoholic cirrhosis, anemia, sepsis.         Past Medical History:  Diagnosis Date  . Alcohol abuse   . Alcoholic cirrhosis ([place])   . Alcoholism ([place])   . Arthritis   . Cirrhosis [facility])     Patient Active Problem List   Diagnosis Date Noted  . Cellulitis 06/16/2018  . Sepsis ([place]) 06/28/2017  . Iron deficiency anemia 05/19/2017  . Anasarca 02/29/2016    Past Surgical History:   Procedure Laterality Date  . ESOPHAGOGASTRODUODENOSCOPY (EGD) WITH PROPOFOL N/A 07/01/2017   Procedure: ESOPHAGOGASTRODUODENOSCOPY (EGD) WITH PROPOFOL;  Surgeon: Lucilla Lame, MD;  Location: [facility] ENDOSCOPY;  Service: Endoscopy;  Laterality: N/A;    Prior to Admission medications   Medication Sig Start Date End Date Taking? Authorizing Provider  aspirin EC 81 MG tablet Take 81 mg by mouth daily. 03/05/16   [provider]  ergocalciferol (VITAMIN D2) 50000 units capsule Take 1 capsule (50,000 Units total) by mouth once a week. 05/19/17   Earlie Server, MD  ferrous sulfate 325 (65 FE) MG tablet Take 1 tablet (325 mg total) by mouth 3 (three) times daily with meals. 07/02/17   Salary, Avel Peace, MD  fluticasone (FLONASE) 50 MCG/ACT nasal spray Place 2 sprays into both nostrils daily. 07/02/17   Salary, Avel Peace, MD  furosemide (LASIX) 20 MG tablet Take 1 tablet (20 mg total) by mouth daily. 07/02/17 07/02/18  Salary, Holly Bodily D, MD  hydrOXYzine (ATARAX/VISTARIL) 25 MG tablet Take 25 mg by mouth 3 (three) times daily as needed. 04/01/17   [provider]  meloxicam (MOBIC) 15 MG tablet Take 1 tablet (15 mg total) by mouth daily. 07/22/18   Darnell Jeschke, Charline Bills, PA-C  methocarbamol (ROBAXIN) 500 MG tablet Take 1 tablet (500 mg total) by mouth 4 (four) times daily. 07/22/18   Quention Mcneill, Charline Bills, PA-C  oxyCODONE (ROXICODONE) 5 MG immediate release tablet Take 1 tablet (5 mg total) by mouth every 8 (eight) hours as needed. 07/22/18 07/22/19  Rand Etchison, Charline Bills,  PA-C  pantoprazole (PROTONIX) 40 MG tablet Take 1 tablet (40 mg total) by mouth daily. 07/02/17 07/02/18  Salary, Avel Peace, MD  potassium chloride SA (K-DUR,KLOR-CON) 20 MEQ tablet Take 2 tablets (40 mEq total) by mouth 2 (two) times daily. 07/02/17   Salary, Avel Peace, MD  sodium chloride (OCEAN) 0.65 % SOLN nasal spray Place 1 spray into both nostrils as needed for congestion. 07/02/17   Salary, Avel Peace, MD  spironolactone (ALDACTONE) 25  MG tablet Take 0.5 tablets (12.5 mg total) by mouth 2 (two) times daily. 07/02/17   Salary, Avel Peace, MD    Allergies Hydrocodone-acetaminophen and Penicillins  Family History  Problem Relation Age of Onset  . Hypertension Mother   . Diabetes Father   . Cervical cancer Sister   . Cirrhosis Brother     Social History Social History   Tobacco Use  . Smoking status: Never Smoker  . Smokeless tobacco: Never Used  Substance Use Topics  . Alcohol use: No    Comment: quit 2 years   . Drug use: No     Review of Systems  Constitutional: No fever/chills at home.  Patient endorses chills in the emergency department. Eyes: No visual changes. No discharge ENT: No upper respiratory complaints. Cardiovascular: no chest pain. Respiratory: no cough. No SOB. Gastrointestinal: No abdominal pain.  No nausea, no vomiting.  No diarrhea.  No constipation. Genitourinary: Negative for dysuria. No hematuria Musculoskeletal: Positive for right hip pain Skin: Negative for rash, abrasions, lacerations, ecchymosis. Neurological: Negative for headaches, focal weakness or numbness. 10-point ROS otherwise negative.  ____________________________________________   PHYSICAL EXAM:  VITAL SIGNS: ED Triage Vitals  Enc Vitals Group     BP 07/22/18 1401 (!) 160/87     Pulse Rate 07/22/18 1401 98     Resp 07/22/18 1401 20     Temp 07/22/18 1401 100.3 F (37.9 C)     Temp Source 07/22/18 1401 Oral     SpO2 07/22/18 1401 100 %     Weight 07/22/18 1353 295 lb (133.8 kg)     Height 07/22/18 1353 5\' 8"  (1.727 m)     Head Circumference --      Peak Flow --      Pain Score 07/22/18 1352 8     Pain Loc --      Pain Edu? --      Excl. in [place]? --      Constitutional: Alert and oriented. Well appearing and in no acute distress. Eyes: Conjunctivae are normal. PERRL. EOMI. Head: Atraumatic. ENT:      Ears:       Nose: No congestion/rhinnorhea.      Mouth/Throat: Mucous membranes are moist.   Oropharynx is nonerythematous and nonedematous.  Uvula is midline. Neck: No stridor.  Neck is supple full range of motion Hematological/Lymphatic/Immunilogical: No cervical lymphadenopathy. Cardiovascular: Normal rate, regular rhythm. Normal S1 and S2.  Good peripheral circulation. Respiratory: Normal respiratory effort without tachypnea or retractions. Lungs CTAB. Good air entry to the bases with no decreased or absent breath sounds. Gastrointestinal: Bowel sounds 4 quadrants. Soft and nontender to palpation. No guarding or rigidity. No palpable masses. No distention. No CVA tenderness. Musculoskeletal: Limited range of motion to the right lower extremity due to pain, otherwise full range of motion to all extremities.  No gross deformities appreciated.  No shortening or rotation of the right lower extremity.  Limited range of motion due to pain.  Good passive range of motion.  Patient is  nontender to palpation over the osseous and muscular structures of the right hip.  No palpable abnormality or deficits.  Examination of the lumbar spine and right knee is unremarkable.  Dorsalis pedis pulse intact distally.  Sensation intact distally. Neurologic:  Normal speech and language. No gross focal neurologic deficits are appreciated.  Skin:  Skin is warm, dry and intact. No rash noted.  Patient had a previous source of infection to the left lower extremity with cellulitis.  On exam, this area is not warm to palpation, no erythema or gross edema.  No fluctuance or induration.  No tenderness to palpation in this area. Psychiatric: Mood and affect are normal. Speech and behavior are normal. Patient exhibits appropriate insight and judgement.   ____________________________________________   LABS (all labs ordered are listed, but only abnormal results are displayed)  Labs Reviewed  COMPREHENSIVE METABOLIC PANEL - Abnormal; Notable for the following components:      Result Value   Sodium 134 (*)    Calcium  8.0 (*)    Albumin 2.3 (*)    AST 59 (*)    Alkaline Phosphatase 169 (*)    Total Bilirubin 3.9 (*)    All other components within normal limits  CBC WITH DIFFERENTIAL/PLATELET - Abnormal; Notable for the following components:   WBC 13.5 (*)    RBC 3.68 (*)    Hemoglobin 12.6 (*)    HCT 37.2 (*)    MCV 101.1 (*)    MCH 34.2 (*)    RDW 16.2 (*)    Platelets 82 (*)    Neutro Abs 11.9 (*)    Abs Immature Granulocytes 0.08 (*)    All other components within normal limits  URINALYSIS, COMPLETE (UACMP) WITH MICROSCOPIC - Abnormal; Notable for the following components:   Color, Urine AMBER (*)    APPearance CLEAR (*)    Hgb urine dipstick SMALL (*)    All other components within normal limits  SARS CORONAVIRUS 2 (HOSPITAL ORDER, [place] [facility])  CULTURE, BLOOD (ROUTINE X 2)  CULTURE, BLOOD (ROUTINE X 2)  LACTIC ACID, PLASMA  LACTIC ACID, PLASMA   ____________________________________________  EKG   ____________________________________________  RADIOLOGY I personally viewed and evaluated these images as part of my medical decision making, as well as reviewing the written report by the radiologist.  Dg Chest 1 View  Result Date: 07/22/2018 CLINICAL DATA:  Fever. EXAM: CHEST  1 VIEW COMPARISON:  06/16/2018. FINDINGS: Stable enlarged cardiac silhouette and prominent pulmonary vasculature. Clear lungs. No acute bony abnormality. IMPRESSION: No acute abnormality. Stable cardiomegaly and pulmonary vascular congestion. Electronically Signed   By: Claudie Revering M.D.   On: 07/22/2018 15:55   Dg Lumbar Spine 2-3 Views  Result Date: 07/22/2018 CLINICAL DATA:  Acute onset right hip pain this morning. No known injury. EXAM: LUMBAR SPINE - 2-3 VIEW COMPARISON:  None. FINDINGS: Five non-rib-bearing lumbar vertebrae. Mild anterior spur formation multiple levels. No fractures, pars defects or subluxations. IMPRESSION: Mild degenerative changes. No acute abnormality.  Electronically Signed   By: Claudie Revering M.D.   On: 07/22/2018 15:59   Dg Hip Unilat W Or Wo Pelvis 2-3 Views Right  Result Date: 07/22/2018 CLINICAL DATA:  Acute right hip pain. No known injury. Intermittent fever. EXAM: DG HIP (WITH OR WITHOUT PELVIS) 2-3V RIGHT COMPARISON:  None. FINDINGS: There is no evidence of hip fracture or dislocation. There is no evidence of arthropathy or other focal bone abnormality. IMPRESSION: Normal examination. Electronically Signed  By: Claudie Revering M.D.   On: 07/22/2018 15:48    ____________________________________________    PROCEDURES  Procedure(s) performed:    Procedures    Medications  sodium chloride 0.9 % bolus 1,000 mL (1,000 mLs Intravenous New Bag/Given 07/22/18 1609)  ondansetron (ZOFRAN) injection 4 mg (4 mg Intravenous Given 07/22/18 1559)  morphine 4 MG/ML injection 4 mg (4 mg Intravenous Given 07/22/18 1600)  ibuprofen (ADVIL) tablet 600 mg (600 mg Oral Given 07/22/18 1623)  vancomycin (VANCOCIN) IVPB 1000 mg/200 mL premix (0 mg Intravenous Stopped 07/22/18 1751)  cefTRIAXone (ROCEPHIN) 1 g in sodium chloride 0.9 % 100 mL IVPB (0 g Intravenous Stopped 07/22/18 1710)     ____________________________________________   INITIAL IMPRESSION / ASSESSMENT AND PLAN / ED COURSE  Pertinent labs & imaging results that were available during my care of the patient were reviewed by me and considered in my medical decision making (see chart for details).  Review of the [place] CSRS was performed in accordance of the [place] prior to dispensing any controlled drugs.  Clinical Course as of Jul 21 1957  Tue Jul 22, 2018  1517 Patient presented to the emergency department via EMS for complaint of right hip pain.  Patient denied any other complaints other than hip pain.  Patient's vitals reveal that patient is febrile at 100.3 F.  Patient is visibly shivering with chattering teeth during interview.  When asked about other infectious symptoms, patient denies  any other complaints.  At this time patient will be evaluated for musculoskeletal pain as well as labs as patient does have a history of sepsis without reported sources of infection.  At this time, patient will receive fluids.  Without a significant source of infection reported at this time, I will hold empiric antibiotic therapy until results from work-up returned.   [JC]  3016 Recheck of patient's vitals reveals temperature increased to 103 F.  At this time, patient will have antibiotics empirically started.  Patient does have a history of allergy to penicillins but is received Rocephin in the past without problems.  Patient will have dosing of Rocephin as well as vancomycin here in the emergency department pending further results.   [JC]  1947 Patient presented to the emergency department complaining of posterior right hip pain.  Patient has a history of lumbar radiculopathy, states that the pain was in the same distribution just much worse than normal.  Patient denied any other complaint at that time.  Patient did have a low-grade temperature of 100.3 F.  As such, further investigation with labs, chest x-ray, urinalysis was undertaken as patient does have a history of sepsis.  While in emergency department, patient's temperature did rise to 103 F.  Patient was started on broad-spectrum antibiotics empirically with vancomycin and Rocephin.  Remainder of work-up with the exception of an elevated white blood cell count was inconclusive for a good source of infection.  Exam did not reveal any other strong indication of source of infection.  Patient denied any complaints throughout his encounter here in the emergency department other than hip pain.  Hip pain drastically improved with pain medication.  At this time, blood cultures are still pending and we will call the patient back should they return with positive results.  Have given the patient strong return precautions for any change or worsening of his  symptoms.  Patient verbalizes understanding at this time.   [JC]    Clinical Course User Index [JC] Chaela Branscum, Charline Bills, PA-C  The patient was evaluated for the symptoms described in the history of present illness. The patient was evaluated in the context of the global COVID-19 pandemic, which necessitated consideration that the patient might be at risk for infection with the SARS-CoV-2 virus that causes COVID-19. Institutional protocols and algorithms that pertain to the evaluation of patients at risk for COVID-19 are in a state of rapid change based on information released by regulatory bodies including the CDC and federal and state organizations. The most current policies and algorithms were followed during the patient's care in the ED.     Patient's diagnosis is consistent with lumbar radiculopathy, fever.  Patient presented to the emergency department with a primary complaint of right hip pain.  Evaluation of this area was reassuring with no acute findings on physical exam or imaging.  Patient does have a history of lumbar radiculopathy and states that symptoms were similar just worse than normal.  Patient also had a fever of 100.3 F initially.  This rose to 103 F in the emergency department.  Patient denied any other complaint at this time.  He denied any symptoms consistent with infection to include URI symptoms, GI symptoms, urinary symptoms.  Patient did have an admission 1 month prior for cellulitis of the left lower extremity.  Left lower extremity was visualized with no gross signs of infection at this time.  At this time, patient does have a some lightly elevated white blood cell count at 13.5 but lactic acid was within normal limits.  No other identified source of infection on work-up.  I discussed at length with patient symptoms, physical exam finding, lab results and my concerns with the patient.  I discussed the case with attending provider, Dr. Archie Balboa who agrees that patient is  suitable for discharge at this time as long as patient has returned precautions.  These are discussed at length with the patient.  Patient will return for any new or changing symptoms.. Patient will be discharged home with prescriptions for muscle relaxer, anti-inflammatory limited pain medication. Patient is to follow up with primary care as needed or otherwise directed. Patient is given ED precautions to return to the ED for any worsening or new symptoms.     ____________________________________________  FINAL CLINICAL IMPRESSION(S) / ED DIAGNOSES  Final diagnoses:  Lumbar radiculopathy  Fever, unspecified fever cause      NEW MEDICATIONS STARTED DURING THIS VISIT:  ED Discharge Orders         Ordered    meloxicam (MOBIC) 15 MG tablet  Daily     07/22/18 1959    methocarbamol (ROBAXIN) 500 MG tablet  4 times daily     07/22/18 1959    oxyCODONE (ROXICODONE) 5 MG immediate release tablet  Every 8 hours PRN     07/22/18 1959              This chart was dictated using voice recognition software/Dragon. Despite best efforts to proofread, errors can occur which can change the meaning. Any change was purely unintentional.    Darletta Moll, PA-C 07/22/18 1959    Nance Pear, MD 07/22/18 2043

## 2018-07-22 NOTE — ED Notes (Signed)
Increased acuity level to 3 due to fever of 100.3. EDP notified.

## 2018-07-23 ENCOUNTER — Telehealth: Payer: Self-pay | Admitting: Emergency Medicine

## 2018-07-23 ENCOUNTER — Emergency Department
Admission: EM | Admit: 2018-07-23 | Discharge: 2018-07-23 | Disposition: A | Discharge status: Home / Self Care | Payer: Medicaid Other | Attending: Emergency Medicine | Admitting: Emergency Medicine

## 2018-07-23 ENCOUNTER — Encounter: Payer: Self-pay | Admitting: Emergency Medicine

## 2018-07-23 DIAGNOSIS — R799 Abnormal finding of blood chemistry, unspecified: Secondary | ICD-10-CM | POA: Insufficient documentation

## 2018-07-23 DIAGNOSIS — Z5321 Procedure and treatment not carried out due to patient leaving prior to being seen by health care provider: Secondary | ICD-10-CM | POA: Diagnosis not present

## 2018-07-23 LAB — COMPREHENSIVE METABOLIC PANEL
ALT: 26 U/L (ref 0–44)
AST: 50 U/L — ABNORMAL HIGH (ref 15–41)
Albumin: 2.1 g/dL — ABNORMAL LOW (ref 3.5–5.0)
Alkaline Phosphatase: 144 U/L — ABNORMAL HIGH (ref 38–126)
Anion gap: 4 — ABNORMAL LOW (ref 5–15)
BUN: 23 mg/dL — ABNORMAL HIGH (ref 6–20)
CO2: 24 mmol/L (ref 22–32)
Calcium: 7.7 mg/dL — ABNORMAL LOW (ref 8.9–10.3)
Chloride: 104 mmol/L (ref 98–111)
Creatinine, Ser: 1.18 mg/dL (ref 0.61–1.24)
GFR calc Af Amer: >60 mL/min (ref >60)
GFR calc non Af Amer: >60 mL/min (ref >60)
Glucose, Bld: 114 mg/dL — ABNORMAL HIGH (ref 70–99)
Potassium: 4.8 mmol/L (ref 3.5–5.1)
Sodium: 132 mmol/L — ABNORMAL LOW (ref 135–145)
Total Bilirubin: 5.4 mg/dL — ABNORMAL HIGH (ref 0.3–1.2)
Total Protein: 6.9 g/dL (ref 6.5–8.1)

## 2018-07-23 LAB — BLOOD CULTURE ID PANEL (REFLEXED)

## 2018-07-23 LAB — CBC WITH DIFFERENTIAL/PLATELET
Abs Immature Granulocytes: 0.08 10*3/uL — ABNORMAL HIGH (ref 0.00–0.07)
Basophils Absolute: 0.1 10*3/uL (ref 0.0–0.1)
Basophils Relative: 0 %
Eosinophils Absolute: 0.1 10*3/uL (ref 0.0–0.5)
Eosinophils Relative: 1 %
HCT: 34.9 % — ABNORMAL LOW (ref 39.0–52.0)
Hemoglobin: 11.7 g/dL — ABNORMAL LOW (ref 13.0–17.0)
Immature Granulocytes: 1 %
Lymphocytes Relative: 10 %
Lymphs Abs: 1.5 10*3/uL (ref 0.7–4.0)
MCH: 33.9 pg (ref 26.0–34.0)
MCHC: 33.5 g/dL (ref 30.0–36.0)
MCV: 101.2 fL — ABNORMAL HIGH (ref 80.0–100.0)
Monocytes Absolute: 0.9 10*3/uL (ref 0.1–1.0)
Monocytes Relative: 6 %
Neutro Abs: 12.1 10*3/uL — ABNORMAL HIGH (ref 1.7–7.7)
Neutrophils Relative %: 82 %
Platelets: 66 10*3/uL — ABNORMAL LOW (ref 150–400)
RBC: 3.45 MIL/uL — ABNORMAL LOW (ref 4.22–5.81)
RDW: 16.4 % — ABNORMAL HIGH (ref 11.5–15.5)
WBC: 14.7 10*3/uL — ABNORMAL HIGH (ref 4.0–10.5)
nRBC: 0 % (ref 0.0–0.2)

## 2018-07-23 NOTE — ED Notes (Signed)
First RN Note: Pt called by Kris Mouton, RN due to positive blood culture at this time.

## 2018-07-23 NOTE — ED Notes (Signed)
Pt noted leaving ED lobby with male pushing him in w/c; male returned empty w/c

## 2018-07-23 NOTE — ED Notes (Signed)
Pt has not returned to ED lobby 

## 2018-07-23 NOTE — ED Notes (Signed)
Lab called positive blood culture pt to be called back per Dr. Owens Shark. Attempted to call [phone] with no answer. Message left and will have Caesar Chestnut follow up.

## 2018-07-23 NOTE — Telephone Encounter (Addendum)
Called patient due to positive blood culture.  Per dr Owens Shark patient should return for recheck. Called mobile number and girlfriend answered.  She says she is at the pharmacy picking up his medications.  I tried the home number which is his, and left message.  I did tell his girlfriend that he is recommended to return to the ED for recheck.  Patient called me back at [id number].  When I told him the reason he needed to return he says that they did not draw blood from him.  Says they put iv in both arms, but did not draw any blood. I expained that they would have drawn the blood at the same time as IV start.  He continues to insist that he never had blood draw. He says he knows because he has been to the doctor a lot of times.  Says it is a waste of time and that we should have drawn his blood while he was here.   I could not convince him that we have lab results. I told him that the doctor has recommended that he return for  A recheck.

## 2018-07-23 NOTE — ED Triage Notes (Signed)
Pt reports he was called by hospital staff to come to ED due to lab results taken on 7/14 when pt was seen for right hip pain. Pt has hx/o cirrhosis. Pt unknown what lab result.

## 2018-07-25 LAB — CULTURE, BLOOD (ROUTINE X 2)
Special Requests: ADEQUATE
Special Requests: ADEQUATE

## 2018-08-25 ENCOUNTER — Other Ambulatory Visit: Payer: Self-pay | Admitting: Sports Medicine

## 2018-08-25 DIAGNOSIS — M25551 Pain in right hip: Secondary | ICD-10-CM

## 2018-08-25 DIAGNOSIS — M1611 Unilateral primary osteoarthritis, right hip: Secondary | ICD-10-CM

## 2018-09-02 ENCOUNTER — Ambulatory Visit
Admission: RE | Admit: 2018-09-02 | Discharge: 2018-09-02 | Disposition: A | Discharge status: Home / Self Care | Payer: Medicaid Other | Source: Ambulatory Visit | Attending: Sports Medicine | Admitting: Sports Medicine

## 2018-09-02 ENCOUNTER — Other Ambulatory Visit: Payer: Self-pay

## 2018-09-02 DIAGNOSIS — M1611 Unilateral primary osteoarthritis, right hip: Secondary | ICD-10-CM | POA: Insufficient documentation

## 2018-09-02 DIAGNOSIS — M25551 Pain in right hip: Secondary | ICD-10-CM | POA: Diagnosis not present

## 2018-09-02 MED ORDER — GADOBUTROL 1 MMOL/ML IV SOLN
10.0000 mL | Freq: Once | INTRAVENOUS | Status: AC | PRN
  Administered 2018-09-02: 10 mL via INTRAVENOUS

## 2018-09-04 ENCOUNTER — Ambulatory Visit: Payer: Medicaid Other

## 2018-09-04 ENCOUNTER — Other Ambulatory Visit: Payer: Self-pay | Admitting: Sports Medicine

## 2018-09-04 ENCOUNTER — Other Ambulatory Visit: Payer: Self-pay | Admitting: Internal Medicine

## 2018-09-04 DIAGNOSIS — M4658 Other infective spondylopathies, sacral and sacrococcygeal region: Secondary | ICD-10-CM

## 2018-09-05 ENCOUNTER — Other Ambulatory Visit: Payer: Self-pay

## 2018-09-05 ENCOUNTER — Ambulatory Visit
Admission: RE | Admit: 2018-09-05 | Discharge: 2018-09-05 | Disposition: A | Discharge status: Home / Self Care | Payer: Medicaid Other | Source: Ambulatory Visit | Attending: Sports Medicine | Admitting: Sports Medicine

## 2018-09-05 ENCOUNTER — Other Ambulatory Visit
Admission: RE | Admit: 2018-09-05 | Discharge: 2018-09-05 | Disposition: A | Discharge status: Home / Self Care | Payer: Medicaid Other | Source: Ambulatory Visit | Attending: Sports Medicine | Admitting: Sports Medicine

## 2018-09-05 DIAGNOSIS — M4658 Other infective spondylopathies, sacral and sacrococcygeal region: Secondary | ICD-10-CM

## 2018-09-05 LAB — SYNOVIAL CELL COUNT + DIFF, W/ CRYSTALS
Crystals, Fluid: NONE SEEN
Eosinophils-Synovial: 0 %
Lymphocytes-Synovial Fld: 6 %
Monocyte-Macrophage-Synovial Fluid: 0 %
Neutrophil, Synovial: 94 %
WBC, Synovial: 326 /mm3 — ABNORMAL HIGH (ref 0–200)

## 2018-09-08 LAB — BODY FLUID CULTURE

## 2018-09-09 ENCOUNTER — Ambulatory Visit: Payer: Medicaid Other | Admitting: Infectious Diseases

## 2018-09-11 ENCOUNTER — Other Ambulatory Visit: Payer: Self-pay

## 2018-09-11 ENCOUNTER — Other Ambulatory Visit: Payer: Self-pay | Admitting: Licensed Clinical Social Worker

## 2018-09-11 ENCOUNTER — Encounter: Payer: Self-pay | Admitting: Infectious Diseases

## 2018-09-11 ENCOUNTER — Ambulatory Visit: Payer: Medicaid Other | Attending: Infectious Diseases | Admitting: Infectious Diseases

## 2018-09-11 ENCOUNTER — Other Ambulatory Visit: Payer: Self-pay | Admitting: Gastroenterology

## 2018-09-11 VITALS — BP 149/75 | HR 84 | Temp 98.6°F | Ht 68.0 in | Wt 305.0 lb

## 2018-09-11 DIAGNOSIS — B9689 Other specified bacterial agents as the cause of diseases classified elsewhere: Secondary | ICD-10-CM

## 2018-09-11 DIAGNOSIS — F1010 Alcohol abuse, uncomplicated: Secondary | ICD-10-CM

## 2018-09-11 DIAGNOSIS — D539 Nutritional anemia, unspecified: Secondary | ICD-10-CM

## 2018-09-11 DIAGNOSIS — Z885 Allergy status to narcotic agent status: Secondary | ICD-10-CM

## 2018-09-11 DIAGNOSIS — D696 Thrombocytopenia, unspecified: Secondary | ICD-10-CM

## 2018-09-11 DIAGNOSIS — R7881 Bacteremia: Secondary | ICD-10-CM | POA: Diagnosis not present

## 2018-09-11 DIAGNOSIS — M545 Low back pain: Secondary | ICD-10-CM

## 2018-09-11 DIAGNOSIS — K7460 Unspecified cirrhosis of liver: Secondary | ICD-10-CM

## 2018-09-11 DIAGNOSIS — G8929 Other chronic pain: Secondary | ICD-10-CM

## 2018-09-11 DIAGNOSIS — Z880 Allergy status to penicillin: Secondary | ICD-10-CM

## 2018-09-11 DIAGNOSIS — K7030 Alcoholic cirrhosis of liver without ascites: Secondary | ICD-10-CM

## 2018-09-11 DIAGNOSIS — M00851 Arthritis due to other bacteria, right hip: Secondary | ICD-10-CM

## 2018-09-11 MED ORDER — LEVOFLOXACIN 750 MG PO TABS: 750.0000 mg | ORAL_TABLET | Freq: Every day | ORAL | 1 refills | Status: DC

## 2018-09-11 NOTE — Progress Notes (Addendum)
NAME: Jordan Hayden  DOB: [birth year]  MRN: [id number]  Date/Time: 09/11/2018 11:06 AM  REQUESTING PROVIDER: Candelaria Hayden Subjective:  REASON FOR CONSULT: Septic arthritis of the right hip ? Jordan Hayden is a 53 y.o. male with a history of alcohol abuse, alcoholic cirrhosis has had right hip and back pain for the past 2 months.  He had initially presented to the emergency department on 07/22/2018 with acute right hip pain on underlying chronic low back pain .  In the ED he was noted to have a temperature of 100.3 on arrival which later increased to 103.  Blood cultures were sent And he was given a dose of ceftriaxone and vancomycin in the emergency department.  His white count was mildly elevated.  As his pain drastically improved with pain medication he was sent home and told that he would be called back if his blood culture came positive.  The next day the blood culture was reported positive for Citrobacter and patient was called and there is a note by the ER nurse.  Patient refused to believe that blood was drawn for cultures and he refused to come to the hospital initially but it looks like he came to the ED later on but left from the waiting room.  He then went to see his PCP Dr. Netty Hayden on 07/30/2018 and was given tapering dose of prednisone for the pain.  Prednisone.  He was referred to orthopedics who saw him on 08/18/2018 and did an aspiration of the right hip on 09/05/2018.  The fluid that was sent showed Citrobacter koseri in the culture.  WBC was only 326.  Patient is referred to me to start antibiotics. Patient currently still has right hip pain but slightly better than before after the steroids.  He does not have any fever or chills currently.  He says he stopped alcohol.  He has advanced cirrhosis and saw GI just before my visit.  He has edema of his legs.   Past Medical History:  Diagnosis Date  . Alcohol abuse   . Alcoholic cirrhosis ([place])   . Alcoholism ([place])   . Arthritis   .  Cirrhosis [facility])     Past Surgical History:  Procedure Laterality Date  . ESOPHAGOGASTRODUODENOSCOPY (EGD) WITH PROPOFOL N/A 07/01/2017   Procedure: ESOPHAGOGASTRODUODENOSCOPY (EGD) WITH PROPOFOL;  Surgeon: Lucilla Lame, MD;  Location: [facility] ENDOSCOPY;  Service: Endoscopy;  Laterality: N/A;    Social History   Socioeconomic History  . Marital status: Single    Spouse name: Not on file  . Number of children: 1  . Years of education: Not on file  . Highest education level: Not on file  Occupational History  . Not on file  Social Needs  . Financial resource strain: Not on file  . Food insecurity    Worry: Not on file    Inability: Not on file  . Transportation needs    Medical: Not on file    Non-medical: Not on file  Tobacco Use  . Smoking status: Never Smoker  . Smokeless tobacco: Never Used  Substance and Sexual Activity  . Alcohol use: No    Comment: quit 2 years   . Drug use: No  . Sexual activity: Not on file  Lifestyle  . Physical activity    Days per week: Not on file    Minutes per session: Not on file  . Stress: Not on file  Relationships  . Social connections    Talks on phone: Not on file  Gets together: Not on file    Attends religious service: Not on file    Active member of club or organization: Not on file    Attends meetings of clubs or organizations: Not on file    Relationship status: Not on file  . Intimate partner violence    Fear of current or ex partner: Not on file    Emotionally abused: Not on file    Physically abused: Not on file    Forced sexual activity: Not on file  Other Topics Concern  . Not on file  Social History Narrative  . Not on file    Family History  Problem Relation Age of Onset  . Hypertension Mother   . Diabetes Father   . Cervical cancer Sister   . Cirrhosis Brother    Allergies  Allergen Reactions  . Hydrocodone-Acetaminophen Swelling    Mouth Swelling   . Penicillins Swelling    ? Current Outpatient  Medications  Medication Sig Dispense Refill  . aspirin EC 81 MG tablet Take 81 mg by mouth daily.    . ergocalciferol (VITAMIN D2) 50000 units capsule Take 1 capsule (50,000 Units total) by mouth once a week. 8 capsule 0  . ferrous sulfate 325 (65 FE) MG tablet Take 1 tablet (325 mg total) by mouth 3 (three) times daily with meals. 180 tablet 0  . fluticasone (FLONASE) 50 MCG/ACT nasal spray Place 2 sprays into both nostrils daily. 16 g 2  . hydrOXYzine (ATARAX/VISTARIL) 25 MG tablet Take 25 mg by mouth 3 (three) times daily as needed.    . meloxicam (MOBIC) 15 MG tablet Take 1 tablet (15 mg total) by mouth daily. 30 tablet 0  . methocarbamol (ROBAXIN) 500 MG tablet Take 1 tablet (500 mg total) by mouth 4 (four) times daily. 16 tablet 0  . predniSONE (DELTASONE) 10 MG tablet Take 4 tablets by mouth 3 (three) times daily.    . sodium chloride (OCEAN) 0.65 % SOLN nasal spray Place 1 spray into both nostrils as needed for congestion. 104 Bottle 3  . spironolactone (ALDACTONE) 25 MG tablet Take 0.5 tablets (12.5 mg total) by mouth 2 (two) times daily. 60 tablet 0  . furosemide (LASIX) 20 MG tablet Take 1 tablet (20 mg total) by mouth daily. 60 tablet 0  . lactulose (CHRONULAC) 10 GM/15ML solution Take 30 mLs by mouth 2 (two) times daily.    Marland Kitchen oxyCODONE (ROXICODONE) 5 MG immediate release tablet Take 1 tablet (5 mg total) by mouth every 8 (eight) hours as needed. (Patient not taking: Reported on 09/11/2018) 10 tablet 0  . pantoprazole (PROTONIX) 40 MG tablet Take 1 tablet (40 mg total) by mouth daily. 180 tablet 0  . potassium chloride SA (K-DUR,KLOR-CON) 20 MEQ tablet Take 2 tablets (40 mEq total) by mouth 2 (two) times daily. (Patient not taking: Reported on 09/11/2018) 60 tablet 0   No current facility-administered medications for this visit.      Abtx:  Anti-infectives (From admission, onward)   None      REVIEW OF SYSTEMS:  Const: negative fever, negative chills, negative weight loss Eyes:  negative diplopia or visual changes, negative eye pain ENT: negative coryza, negative sore throat Resp: negative cough, hemoptysis, dyspnea Cards: negative for chest pain, palpitations, lower extremity edema GU: negative for frequency, dysuria and hematuria GI: Negative for abdominal pain, diarrhea, bleeding, constipation Skin: negative for rash and pruritus Heme: negative for easy bruising and gum/nose bleeding MS: Pain low back, right hip pain  with mobility restricted Neurolo:negative for headaches, dizziness, vertigo, memory problems  Psych: negative for feelings of anxiety, depression  Endocrine: No polyuria or polydipsia Allergy/Immunology-penicillin allergy noted ?  Objective:  VITALS:  BP (!) 149/75 (BP Location: Right Arm, Patient Position: Sitting, Cuff Size: Large)   Pulse 84   Temp 98.6 F (37 C) (Oral)   Ht 5\' 8"  (1.727 m)   Wt (!) 305 lb (138.3 kg)   BMI 46.38 kg/m  PHYSICAL EXAM:  General: Alert, cooperative, no distress, appears stated age.  In wheelchair Head: Normocephalic, without obvious abnormality, atraumatic. Eyes: Conjunctivae clear, anicteric sclerae. Pupils are equal ENT did not examine because of mask Neck: Supple, symmetrical, no adenopathy, thyroid: non tender no carotid bruit and no JVD. Back: No CVA tenderness. Lungs: Clear to auscultation bilaterally. No Wheezing or Rhonchi. No rales. Heart: Regular rate and rhythm, no murmur, rub or gallop. Abdomen: He was not able to get up from his wheelchair onto the bed Extremities: Edema of the legs Skin: Limited examination.  No rashes or lesions. Or bruising Lymph: Cervical, supraclavicular normal. Neurologic: Grossly non-focal Pertinent Labs Lab Results CBC    Component Value Date/Time   WBC 14.7 (H) 07/23/2018 1923   RBC 3.45 (L) 07/23/2018 1923   HGB 11.7 (L) 07/23/2018 1923   HCT 34.9 (L) 07/23/2018 1923   PLT 66 (L) 07/23/2018 1923   MCV 101.2 (H) 07/23/2018 1923   MCH 33.9 07/23/2018 1923    MCHC 33.5 07/23/2018 1923   RDW 16.4 (H) 07/23/2018 1923   LYMPHSABS 1.5 07/23/2018 1923   MONOABS 0.9 07/23/2018 1923   EOSABS 0.1 07/23/2018 1923   BASOSABS 0.1 07/23/2018 1923    CMP Latest Ref Rng & Units 07/23/2018 07/22/2018 06/17/2018  Glucose 70 - 99 mg/dL 114(H) 95 80  BUN 6 - 20 mg/dL 23(H) 15 18  Creatinine 0.61 - 1.24 mg/dL 1.18 1.13 0.87  Sodium 135 - 145 mmol/L 132(L) 134(L) 137  Potassium 3.5 - 5.1 mmol/L 4.8 5.0 4.3  Chloride 98 - 111 mmol/L 104 103 108  CO2 22 - 32 mmol/L 24 24 24   Calcium 8.9 - 10.3 mg/dL 7.7(L) 8.0(L) 8.0(L)  Total Protein 6.5 - 8.1 g/dL 6.9 7.4 -  Total Bilirubin 0.3 - 1.2 mg/dL 5.4(H) 3.9(H) -  Alkaline Phos 38 - 126 U/L 144(H) 169(H) -  AST 15 - 41 U/L 50(H) 59(H) -  ALT 0 - 44 U/L 26 29 -      Microbiology: Recent Results (from the past 240 hour(s))  Body fluid culture     Status: None   Collection Time: 09/05/18 11:45 AM   Specimen: Joint, Hip; Body Fluid  Result Value Ref Range Status   Specimen Description   Final    HIP Performed at [facility], [address], [place], [place] [address]    Special Requests   Final    RIGHT HIP Performed at [facility], [address] [place], [place] [address]    Gram Stain   Final    FEW WBC PRESENT, PREDOMINANTLY PMN NO ORGANISMS SEEN    Culture   Final    RARE CITROBACTER KOSERI CRITICAL RESULT CALLED TO, READ BACK BY AND VERIFIED WITH: DR. Candelaria Hayden [id number] FCP Performed at [facility], [place] [address] [place], [place] [address]    Report Status 09/08/2018 FINAL  Final   Organism ID, Bacteria CITROBACTER KOSERI  Final      Susceptibility   Citrobacter koseri - MIC*  CEFAZOLIN <=4 SENSITIVE Sensitive     CEFEPIME <=1 SENSITIVE Sensitive     CEFTAZIDIME <=1 SENSITIVE Sensitive     CEFTRIAXONE <=1 SENSITIVE Sensitive     CIPROFLOXACIN <=0.25 SENSITIVE Sensitive     GENTAMICIN <=1 SENSITIVE Sensitive     IMIPENEM <=0.25 SENSITIVE Sensitive      TRIMETH/SULFA <=20 SENSITIVE Sensitive     PIP/TAZO <=4 SENSITIVE Sensitive     * RARE CITROBACTER KOSERI    ? Impression/Recommendation ? ?53 year old male with history of alcoholic cirrhosis recently diagnosed Citrobacter koseri right hip septic arthritis and Citrobacter koseri bacteremia in July 2020 not treated is here for treatment.  Citrobacter koseri bacteremia and treated  Right hip septic arthritis with Citrobacter koseri.  Pansensitive organism.  Patient does not want to get a PICC line or take any IV antibiotics.  So p.o. Levaquin as an option it is got excellent bioavailability and 750 mg once a day for 6 weeks would be a good choice for him.  But we have to bear in mind the side effects including tendinopathy, tendinitis tendon rupture, C. difficile, confusion.  We will avoid steroids while he is taking Levaquin as the risk for tendon rupture increases. We will check his labs CBC CMP today and then in 2 weeks He can take a probiotic along with the Levaquin to possibly prevent C. difficile colitis.  Alcoholic cirrhosis.  Patient denies GI bleed.  He has been seen by Dr. Gustavo Lah today and I will follow-up with him.  I am concerned about his adherence.  I volunteered to speak with his friend who lives with him.   patient refused and said it is his business.  Macrocytic anemia  Thrombocytopenia both related to alcohol use in the past and cirrhosis  Hyperbilirubinemia and abnormal LFTs due to advanced cirrhosis  Edema on spironolactone And furosemide  Follow-up in 2 weeks. Both me and my CNA discussed in detail about Levaquin, side effects and gave him information to read about it and asked him to call us as soon as he has any concerns or side effects.  50% of 45-minute visit was spent on counseling him about his illness, antibiotics and being 100% adherent and side effects and how to recognize side effects and call as soon as possible.  ? ________Note:  This document was  prepared using Dragon voice recognition software and may include unintentional dictation errors.  After discussion with Dr.Kubinski it is noted that the culture was taken from the rt sacroiliac joint and it was aspirated by IR and not ortho. MRI from 09/02/18 shows Right SI joint effusion with surrounding marrow edema and enhancement consistent with unilateral sacroiliitis most worrisome for septic joint. So as mentioned above in my note  pt does not have rt septic hip . Management remains the same.

## 2018-09-11 NOTE — Patient Instructions (Addendum)
You are here to seeme for an infection you have in your hip- rt hip- the bacteria is called citrobacter- you had the same bacteria in your blood in July . Today we will start you on this medicine called levaquin 750mg  once a day - you will need for 6 weeks. Please take a probiotic with it. If you notice any worsening pain in the hip or any other joints, chest or abdominal pain or excess fatigue, confusion, nausea diarrhea, rash- please call my office as soon as possible.

## 2018-09-12 DIAGNOSIS — R7881 Bacteremia: Secondary | ICD-10-CM | POA: Insufficient documentation

## 2018-09-12 DIAGNOSIS — M009 Pyogenic arthritis, unspecified: Secondary | ICD-10-CM | POA: Insufficient documentation

## 2018-09-16 ENCOUNTER — Telehealth: Payer: Self-pay | Admitting: Infectious Diseases

## 2018-09-16 NOTE — Telephone Encounter (Signed)
Patient called to tell us he doesn't think antibiotic is working fast enough. Says he is still in some pain moving around in his hip.   He is aware he comes back for another appt next week.  I told him we would get back to him as soon as possible.    Thanks, Cisco

## 2018-09-16 NOTE — Telephone Encounter (Signed)
Tamika, can you call patient and tell him that it will take atleast 2 weeks before he will notice a difference!  He should continue to take it

## 2018-09-16 NOTE — Telephone Encounter (Signed)
Spoke with the patient and he understood. He will continue taking the antibiotics and call us if he has any concerns.

## 2018-09-18 ENCOUNTER — Ambulatory Visit: Payer: Medicaid Other | Admitting: Infectious Diseases

## 2018-09-24 ENCOUNTER — Ambulatory Visit
Admission: RE | Admit: 2018-09-24 | Discharge: 2018-09-24 | Disposition: A | Discharge status: Home / Self Care | Payer: Medicaid Other | Source: Ambulatory Visit | Attending: Gastroenterology | Admitting: Gastroenterology

## 2018-09-24 ENCOUNTER — Other Ambulatory Visit: Payer: Self-pay

## 2018-09-24 DIAGNOSIS — K7460 Unspecified cirrhosis of liver: Secondary | ICD-10-CM | POA: Diagnosis not present

## 2018-09-25 ENCOUNTER — Ambulatory Visit: Payer: Medicaid Other | Admitting: Infectious Diseases

## 2018-09-30 ENCOUNTER — Other Ambulatory Visit: Payer: Self-pay

## 2018-09-30 ENCOUNTER — Ambulatory Visit: Payer: Medicaid Other | Attending: Infectious Diseases | Admitting: Infectious Diseases

## 2018-09-30 ENCOUNTER — Encounter: Payer: Self-pay | Admitting: Infectious Diseases

## 2018-09-30 ENCOUNTER — Other Ambulatory Visit
Admission: RE | Admit: 2018-09-30 | Discharge: 2018-09-30 | Disposition: A | Discharge status: Home / Self Care | Payer: Medicaid Other | Source: Ambulatory Visit | Attending: Infectious Diseases | Admitting: Infectious Diseases

## 2018-09-30 VITALS — BP 148/74 | HR 80 | Temp 97.6°F | Ht 68.0 in | Wt 305.0 lb

## 2018-09-30 DIAGNOSIS — M0080 Arthritis due to other bacteria, unspecified joint: Secondary | ICD-10-CM

## 2018-09-30 DIAGNOSIS — M720 Palmar fascial fibromatosis [Dupuytren]: Secondary | ICD-10-CM

## 2018-09-30 DIAGNOSIS — Z885 Allergy status to narcotic agent status: Secondary | ICD-10-CM

## 2018-09-30 DIAGNOSIS — A498 Other bacterial infections of unspecified site: Secondary | ICD-10-CM | POA: Insufficient documentation

## 2018-09-30 DIAGNOSIS — R7881 Bacteremia: Secondary | ICD-10-CM | POA: Insufficient documentation

## 2018-09-30 DIAGNOSIS — G8929 Other chronic pain: Secondary | ICD-10-CM

## 2018-09-30 DIAGNOSIS — Z880 Allergy status to penicillin: Secondary | ICD-10-CM

## 2018-09-30 DIAGNOSIS — K7030 Alcoholic cirrhosis of liver without ascites: Secondary | ICD-10-CM

## 2018-09-30 DIAGNOSIS — M0088 Arthritis due to other bacteria, vertebrae: Secondary | ICD-10-CM

## 2018-09-30 DIAGNOSIS — B9689 Other specified bacterial agents as the cause of diseases classified elsewhere: Secondary | ICD-10-CM | POA: Diagnosis not present

## 2018-09-30 DIAGNOSIS — E669 Obesity, unspecified: Secondary | ICD-10-CM

## 2018-09-30 DIAGNOSIS — M01X0 Direct infection of unspecified joint in infectious and parasitic diseases classified elsewhere: Secondary | ICD-10-CM | POA: Insufficient documentation

## 2018-09-30 DIAGNOSIS — Z792 Long term (current) use of antibiotics: Secondary | ICD-10-CM

## 2018-09-30 DIAGNOSIS — Z79899 Other long term (current) drug therapy: Secondary | ICD-10-CM

## 2018-09-30 DIAGNOSIS — D649 Anemia, unspecified: Secondary | ICD-10-CM

## 2018-09-30 DIAGNOSIS — M461 Sacroiliitis, not elsewhere classified: Secondary | ICD-10-CM

## 2018-09-30 DIAGNOSIS — Z6841 Body Mass Index (BMI) 40.0 and over, adult: Secondary | ICD-10-CM

## 2018-09-30 DIAGNOSIS — D696 Thrombocytopenia, unspecified: Secondary | ICD-10-CM

## 2018-09-30 LAB — CBC WITH DIFFERENTIAL/PLATELET
Abs Immature Granulocytes: 0.01 10*3/uL (ref 0.00–0.07)
Basophils Absolute: 0.1 10*3/uL (ref 0.0–0.1)
Basophils Relative: 1 %
Eosinophils Absolute: 0.3 10*3/uL (ref 0.0–0.5)
Eosinophils Relative: 4 %
HCT: 33.8 % — ABNORMAL LOW (ref 39.0–52.0)
Hemoglobin: 11.4 g/dL — ABNORMAL LOW (ref 13.0–17.0)
Immature Granulocytes: 0 %
Lymphocytes Relative: 46 %
Lymphs Abs: 2.7 10*3/uL (ref 0.7–4.0)
MCH: 32.5 pg (ref 26.0–34.0)
MCHC: 33.7 g/dL (ref 30.0–36.0)
MCV: 96.3 fL (ref 80.0–100.0)
Monocytes Absolute: 0.6 10*3/uL (ref 0.1–1.0)
Monocytes Relative: 10 %
Neutro Abs: 2.2 10*3/uL (ref 1.7–7.7)
Neutrophils Relative %: 39 %
Platelets: 70 10*3/uL — ABNORMAL LOW (ref 150–400)
RBC: 3.51 MIL/uL — ABNORMAL LOW (ref 4.22–5.81)
RDW: 16.5 % — ABNORMAL HIGH (ref 11.5–15.5)
WBC: 5.7 10*3/uL (ref 4.0–10.5)
nRBC: 0 % (ref 0.0–0.2)

## 2018-09-30 LAB — COMPREHENSIVE METABOLIC PANEL
ALT: 24 U/L (ref 0–44)
AST: 66 U/L — ABNORMAL HIGH (ref 15–41)
Albumin: 2.7 g/dL — ABNORMAL LOW (ref 3.5–5.0)
Alkaline Phosphatase: 164 U/L — ABNORMAL HIGH (ref 38–126)
Anion gap: 7 (ref 5–15)
BUN: 18 mg/dL (ref 6–20)
CO2: 26 mmol/L (ref 22–32)
Calcium: 8.9 mg/dL (ref 8.9–10.3)
Chloride: 103 mmol/L (ref 98–111)
Creatinine, Ser: 1.52 mg/dL — ABNORMAL HIGH (ref 0.61–1.24)
GFR calc Af Amer: >60 mL/min (ref >60)
GFR calc non Af Amer: 52 mL/min — ABNORMAL LOW (ref >60)
Glucose, Bld: 82 mg/dL (ref 70–99)
Potassium: 4.2 mmol/L (ref 3.5–5.1)
Sodium: 136 mmol/L (ref 135–145)
Total Bilirubin: 2.6 mg/dL — ABNORMAL HIGH (ref 0.3–1.2)
Total Protein: 7.1 g/dL (ref 6.5–8.1)

## 2018-09-30 LAB — SEDIMENTATION RATE: Sed Rate: 32 mm/hr — ABNORMAL HIGH (ref 0–20)

## 2018-09-30 LAB — C-REACTIVE PROTEIN: CRP: 1.1 mg/dL — ABNORMAL HIGH (ref <1.0)

## 2018-09-30 NOTE — Patient Instructions (Addendum)
You are here for follow up of  The infection in the rt sacro-iliac joint with citrobacter- you are on levaquin and are doing well. You are moving well now- continue levaquin for another month- take probiiotic.you have no side effects from levaquin-  Will see you back in 1 month Will do labs today

## 2018-09-30 NOTE — Addendum Note (Signed)
Addended by: Santiago Bur on: 09/30/2018 01:24 PM   Modules accepted: Orders

## 2018-09-30 NOTE — Addendum Note (Signed)
Addended by: Santiago Bur on: 09/30/2018 01:23 PM   Modules accepted: Orders

## 2018-09-30 NOTE — Addendum Note (Signed)
Addended by: Santiago Bur on: 09/30/2018 01:22 PM   Modules accepted: Orders

## 2018-09-30 NOTE — Progress Notes (Signed)
NAME: Jordan Hayden  DOB: Nov 27, 1965  MRN: [id number]  Date/Time: 09/30/2018 12:13 PM  Subjective:  Follow-up visit ? Jordan Hayden is a 53 y.o. male with a history of cirrhosis of the liver, right sacroiliac joint septic arthritis due to Citrobacter is here for follow-up. I saw patient first on September 11, 2018 and started him on Levaquin 750 mg once a day.  He has been doing very well.  He is 100% adherent.  He is able to walk.  The pain in the sacral iliac joint is much improved.  He has no other joint pain or ligament pain.  There is no bruising or confusion.   Patient's history is as follows.  He initially presented to the emergency department on 07/22/2018 with acute right hip pain and underlying chronic low back pain.  In the ED had a temperature of 100.3 and blood cultures were sent that grew Citrobacter.  Patient did not follow-up.  He then went to see his PCP on 07/30/2018 and was given tapering dose of prednisone for the pain.  He was then referred to orthopedics who saw him on 08/18/2018.  An MRI of the hip was done on 09/02/2018.  That showed Right SI joint effusion with surrounding marrow edema and enhancement consistent with unilateral sacroiliitis most worrisome for septic joint.  He had IR aspiration of the right iliac joint on 09/05/2018 and that grew Citrobacter as well.  He was referred to me.  I saw him on 09/11/2018 and started him on Levaquin. Patient used to drink alcohol but not anymore.  He has cirrhosis Past Medical History:  Diagnosis Date  . Alcohol abuse   . Alcoholic cirrhosis ([place])   . Alcoholism ([place])   . Arthritis   . Cirrhosis [facility])     Past Surgical History:  Procedure Laterality Date  . ESOPHAGOGASTRODUODENOSCOPY (EGD) WITH PROPOFOL N/A 07/01/2017   Procedure: ESOPHAGOGASTRODUODENOSCOPY (EGD) WITH PROPOFOL;  Surgeon: Lucilla Lame, MD;  Location[facility] ENDOSCOPY;  Service: Endoscopy;  Laterality: N/A;    Social History   Socioeconomic History  . Marital  status: Single    Spouse name: Not on file  . Number of children: 1  . Years of education: Not on file  . Highest education level: Not on file  Occupational History  . Not on file  Social Needs  . Financial resource strain: Not on file  . Food insecurity    Worry: Not on file    Inability: Not on file  . Transportation needs    Medical: Not on file    Non-medical: Not on file  Tobacco Use  . Smoking status: Never Smoker  . Smokeless tobacco: Never Used  Substance and Sexual Activity  . Alcohol use: No    Comment: quit 2 years   . Drug use: No  . Sexual activity: Not on file  Lifestyle  . Physical activity    Days per week: Not on file    Minutes per session: Not on file  . Stress: Not on file  Relationships  . Social Herbalist on phone: Not on file    Gets together: Not on file    Attends religious service: Not on file    Active member of club or organization: Not on file    Attends meetings of clubs or organizations: Not on file    Relationship status: Not on file  . Intimate partner violence    Fear of current or ex partner: Not on file  Emotionally abused: Not on file    Physically abused: Not on file    Forced sexual activity: Not on file  Other Topics Concern  . Not on file  Social History Narrative  . Not on file    Family History  Problem Relation Age of Onset  . Hypertension Mother   . Diabetes Father   . Cervical cancer Sister   . Cirrhosis Brother    Allergies  Allergen Reactions  . Hydrocodone-Acetaminophen Swelling    Mouth Swelling   . Penicillins Swelling    ? Current Outpatient Medications  Medication Sig Dispense Refill  . aspirin EC 81 MG tablet Take 81 mg by mouth daily.    . ferrous sulfate 325 (65 FE) MG tablet Take 1 tablet (325 mg total) by mouth 3 (three) times daily with meals. 180 tablet 0  . fluticasone (FLONASE) 50 MCG/ACT nasal spray Place 2 sprays into both nostrils daily. 16 g 2  . lactulose (CHRONULAC) 10  GM/15ML solution Take 30 mLs by mouth 2 (two) times daily.    Marland Kitchen levofloxacin (LEVAQUIN) 750 MG tablet Take 1 tablet (750 mg total) by mouth daily. 30 tablet 1  . meloxicam (MOBIC) 15 MG tablet Take 1 tablet (15 mg total) by mouth daily. 30 tablet 0  . methocarbamol (ROBAXIN) 500 MG tablet Take 1 tablet (500 mg total) by mouth 4 (four) times daily. 16 tablet 0  . potassium chloride SA (K-DUR,KLOR-CON) 20 MEQ tablet Take 2 tablets (40 mEq total) by mouth 2 (two) times daily. 60 tablet 0  . sodium chloride (OCEAN) 0.65 % SOLN nasal spray Place 1 spray into both nostrils as needed for congestion. 104 Bottle 3  . spironolactone (ALDACTONE) 25 MG tablet Take 0.5 tablets (12.5 mg total) by mouth 2 (two) times daily. 60 tablet 0  . ergocalciferol (VITAMIN D2) 50000 units capsule Take 1 capsule (50,000 Units total) by mouth once a week. 8 capsule 0  . furosemide (LASIX) 20 MG tablet Take 1 tablet (20 mg total) by mouth daily. 60 tablet 0  . hydrOXYzine (ATARAX/VISTARIL) 25 MG tablet Take 25 mg by mouth 3 (three) times daily as needed.    Marland Kitchen oxyCODONE (ROXICODONE) 5 MG immediate release tablet Take 1 tablet (5 mg total) by mouth every 8 (eight) hours as needed. (Patient not taking: Reported on 09/11/2018) 10 tablet 0  . pantoprazole (PROTONIX) 40 MG tablet Take 1 tablet (40 mg total) by mouth daily. 180 tablet 0   No current facility-administered medications for this visit.      Abtx:  Anti-infectives (From admission, onward)   None      REVIEW OF SYSTEMS:  Const: negative fever, negative chills, negative weight loss Eyes: negative diplopia or visual changes, negative eye pain ENT: negative coryza, negative sore throat Resp: negative cough, hemoptysis, dyspnea Cards: negative for chest pain, palpitations, lower extremity edema GU: negative for frequency, dysuria and hematuria GI: Negative for abdominal pain, diarrhea, bleeding, constipation Skin: negative for rash and pruritus Heme: negative for  easy bruising and gum/nose bleeding MS: Has chronic low back pain. Neurolo:negative for headaches, dizziness, vertigo, memory problems  Psych: negative for feelings of anxiety, depression  Endocrine: negative for thyroid, diabetes issues. Allergy/Immunology-penicillin allergy Objective:  VITALS:  BP (!) 148/74 (BP Location: Left Arm, Patient Position: Sitting, Cuff Size: Large)   Pulse 80   Temp 97.6 F (36.4 C) (Oral)   Ht _0  (1.727 m)   Wt (!) 305 lb (138.3 kg)   BMI 46.38  kg/m  PHYSICAL EXAM:  General: Alert, cooperative, no distress, appears stated age.  Patient is ambulating now Head: Normocephalic, without obvious abnormality, atraumatic. Eyes: Conjunctivae clear, anicteric sclerae. Pupils are equal ENT did not examine because of mask Neck: Supple, symmetrical, no adenopathy, thyroid: non tender no carotid bruit and no JVD. Back: No CVA tenderness. Lungs: Clear to auscultation bilaterally. No Wheezing or Rhonchi. No rales. Heart: Regular rate and rhythm, no murmur, rub or gallop. Abdomen: Soft, non-tender,not distended. Bowel sounds normal. No masses Extremities: atraumatic, no cyanosis. No edema. No clubbing Skin: No rashes or lesions. Or bruising Lymph: Cervical, supraclavicular normal. Neurologic: Grossly non-focal, Dupuytren's contraction right hand Pertinent Labs Lab Results  CBC from 09/11/2018 Hemoglobin 11.9 Platelet 115 WBC 6.7   Labs done on 09/11/2018 Sodium 135 Potassium 5.2 Creatinine 1.4 AST 53 ALT 23 Bilirubin 2.1 Albumin 2.6 Alkaline phosphatase 213  Impression/Recommendation ? 53 year old male with history of alcoholic cirrhosis, Citrobacter Corsetti right sacroiliac joint septic arthritis and bacteremia in July 2020 is on Levaquin.  Right SI joint septic arthritis due to Citrobacter on Levaquin.  Has completed nearly 3 weeks of it.  Will need another 4 more weeks of antibiotic.  Today we will do labs to see how is ESR, CRP and liver  function is doing.  Improved mobility.  Initially was wheelchair-bound but walking now.  Cirrhosis of the liver secondary to previous alcohol use. Has been without alcohol for many months now Recent ultrasound did not show any ascites.  Patient is on spironolactone and Lasix.  Thrombocytopenia due to liver cirrhosis.  ?Anemia . ? _Hyperbilirubinemia with transaminitis.  Due to cirrhosis.   Dupuytren's contracture right hand  Obesity patient is planning to walk and lose weight.  Follow-up appointment in 5 weeks .  __________________________________________________ Discussed with patient in great detail.  Note:  This document was prepared using Dragon voice recognition software and may include unintentional dictation errors.

## 2018-10-05 LAB — CULTURE, BLOOD (SINGLE)
Culture: NO GROWTH
Culture: NO GROWTH
Special Requests: ADEQUATE
Special Requests: ADEQUATE

## 2018-11-04 ENCOUNTER — Other Ambulatory Visit: Payer: Self-pay

## 2018-11-04 ENCOUNTER — Ambulatory Visit: Payer: Medicaid Other | Admitting: Infectious Diseases

## 2018-11-13 ENCOUNTER — Ambulatory Visit: Payer: Medicaid Other | Admitting: Infectious Diseases

## 2018-11-18 ENCOUNTER — Ambulatory Visit: Payer: Medicaid Other | Attending: Infectious Diseases | Admitting: Infectious Diseases

## 2018-11-18 ENCOUNTER — Other Ambulatory Visit: Payer: Self-pay

## 2018-11-18 ENCOUNTER — Encounter: Payer: Self-pay | Admitting: Infectious Diseases

## 2018-11-18 VITALS — BP 133/70 | HR 78 | Ht 68.0 in | Wt 314.0 lb

## 2018-11-18 DIAGNOSIS — M461 Sacroiliitis, not elsewhere classified: Secondary | ICD-10-CM

## 2018-11-18 DIAGNOSIS — K7030 Alcoholic cirrhosis of liver without ascites: Secondary | ICD-10-CM

## 2018-11-18 DIAGNOSIS — B9689 Other specified bacterial agents as the cause of diseases classified elsewhere: Secondary | ICD-10-CM

## 2018-11-18 DIAGNOSIS — R7401 Elevation of levels of liver transaminase levels: Secondary | ICD-10-CM

## 2018-11-18 DIAGNOSIS — G8929 Other chronic pain: Secondary | ICD-10-CM

## 2018-11-18 DIAGNOSIS — M545 Low back pain: Secondary | ICD-10-CM

## 2018-11-18 DIAGNOSIS — Z6841 Body Mass Index (BMI) 40.0 and over, adult: Secondary | ICD-10-CM

## 2018-11-18 DIAGNOSIS — M170 Bilateral primary osteoarthritis of knee: Secondary | ICD-10-CM

## 2018-11-18 DIAGNOSIS — M00851 Arthritis due to other bacteria, right hip: Secondary | ICD-10-CM | POA: Diagnosis not present

## 2018-11-18 DIAGNOSIS — Z885 Allergy status to narcotic agent status: Secondary | ICD-10-CM

## 2018-11-18 DIAGNOSIS — E669 Obesity, unspecified: Secondary | ICD-10-CM

## 2018-11-18 DIAGNOSIS — M720 Palmar fascial fibromatosis [Dupuytren]: Secondary | ICD-10-CM

## 2018-11-18 DIAGNOSIS — D696 Thrombocytopenia, unspecified: Secondary | ICD-10-CM

## 2018-11-18 DIAGNOSIS — Z880 Allergy status to penicillin: Secondary | ICD-10-CM

## 2018-11-18 DIAGNOSIS — D649 Anemia, unspecified: Secondary | ICD-10-CM

## 2018-11-18 NOTE — Patient Instructions (Signed)
You have completed antibiotic treatment for the rt sacroiliac infection- It has resolved - you have gained 9 pound weight and that is putting added pressure on your knees causing it to hurt- advise increased movement and decrease in calorie consumption

## 2018-11-18 NOTE — Progress Notes (Signed)
NAME: Jordan Hayden  DOB: Sep 01, 1965  MRN: [id number]  Date/Time: 11/18/2018 10:09 AM  Subjective:  Follow-up visit  Pt recently finished 6 weeks of levaquin for rt sacroiliitis due to citrobacter Pain has resolved completely in the back but now has b/l knee pain- He suffers  from Arthritis and his last xray of the knees was sept 2019 when arthritis was noted ?has gained 9 pounds in 6 weeks.   Patient's history is as follows.  He initially presented to the emergency department on 07/22/2018 with acute right hip pain and underlying chronic low back pain.  In the ED had a temperature of 100.3 and blood cultures were sent that grew Citrobacter.  Patient did not follow-up.  He then went to see his PCP on 07/30/2018 and was given tapering dose of prednisone for the pain.  He was then referred to orthopedics who saw him on 08/18/2018.  An MRI of the hip was done on 09/02/2018.  That showed Right SI joint effusion with surrounding marrow edema and enhancement consistent with unilateral sacroiliitis most worrisome for septic joint.  He had IR aspiration of the right iliac joint on 09/05/2018 and that grew Citrobacter as well.  He was referred to me.  I saw him on 09/11/2018 and started him on Levaquin.HE completed 6 weeks of treatment and the pain has resolved completely. He never had significantly increased inflammatory markers ( ESR/CRP) Patient used to drink alcohol but not anymore.  He has cirrhosis Past Medical History:  Diagnosis Date  . Alcohol abuse   . Alcoholic cirrhosis ([place])   . Alcoholism ([place])   . Arthritis   . Cirrhosis [facility])     Past Surgical History:  Procedure Laterality Date  . ESOPHAGOGASTRODUODENOSCOPY (EGD) WITH PROPOFOL N/A 07/01/2017   Procedure: ESOPHAGOGASTRODUODENOSCOPY (EGD) WITH PROPOFOL;  Surgeon: Lucilla Lame, MD;  Location: [facility] ENDOSCOPY;  Service: Endoscopy;  Laterality: N/A;    Social History   Socioeconomic History  . Marital status: Single    Spouse name: Not on  file  . Number of children: 1  . Years of education: Not on file  . Highest education level: Not on file  Occupational History  . Not on file  Social Needs  . Financial resource strain: Not on file  . Food insecurity    Worry: Not on file    Inability: Not on file  . Transportation needs    Medical: Not on file    Non-medical: Not on file  Tobacco Use  . Smoking status: Never Smoker  . Smokeless tobacco: Never Used  Substance and Sexual Activity  . Alcohol use: No    Comment: quit 2 years   . Drug use: No  . Sexual activity: Not on file  Lifestyle  . Physical activity    Days per week: Not on file    Minutes per session: Not on file  . Stress: Not on file  Relationships  . Social Herbalist on phone: Not on file    Gets together: Not on file    Attends religious service: Not on file    Active member of club or organization: Not on file    Attends meetings of clubs or organizations: Not on file    Relationship status: Not on file  . Intimate partner violence    Fear of current or ex partner: Not on file    Emotionally abused: Not on file    Physically abused: Not on file    Forced  sexual activity: Not on file  Other Topics Concern  . Not on file  Social History Narrative  . Not on file    Family History  Problem Relation Age of Onset  . Hypertension Mother   . Diabetes Father   . Cervical cancer Sister   . Cirrhosis Brother    Allergies  Allergen Reactions  . Hydrocodone-Acetaminophen Swelling    Mouth Swelling   . Penicillins Swelling    ? Current Outpatient Medications  Medication Sig Dispense Refill  . aspirin EC 81 MG tablet Take 81 mg by mouth daily.    . ergocalciferol (VITAMIN D2) 50000 units capsule Take 1 capsule (50,000 Units total) by mouth once a week. 8 capsule 0  . fluticasone (FLONASE) 50 MCG/ACT nasal spray Place 2 sprays into both nostrils daily. 16 g 2  . furosemide (LASIX) 20 MG tablet Take 1 tablet (20 mg total) by mouth  daily. 60 tablet 0  . hydrOXYzine (ATARAX/VISTARIL) 25 MG tablet Take 25 mg by mouth 3 (three) times daily as needed.    . lactulose (CHRONULAC) 10 GM/15ML solution Take 30 mLs by mouth 2 (two) times daily.    Marland Kitchen levofloxacin (LEVAQUIN) 750 MG tablet Take 1 tablet (750 mg total) by mouth daily. 30 tablet 1  . meloxicam (MOBIC) 15 MG tablet Take 1 tablet (15 mg total) by mouth daily. 30 tablet 0  . pantoprazole (PROTONIX) 40 MG tablet Take 1 tablet (40 mg total) by mouth daily. 180 tablet 0  . potassium chloride SA (K-DUR,KLOR-CON) 20 MEQ tablet Take 2 tablets (40 mEq total) by mouth 2 (two) times daily. 60 tablet 0  . spironolactone (ALDACTONE) 25 MG tablet Take 0.5 tablets (12.5 mg total) by mouth 2 (two) times daily. 60 tablet 0   No current facility-administered medications for this visit.      Abtx:  Anti-infectives (From admission, onward)   None      REVIEW OF SYSTEMS:  Const: negative fever, negative chills, negative weight loss Eyes: negative diplopia or visual changes, negative eye pain ENT: negative coryza, negative sore throat Resp: negative cough, hemoptysis, dyspnea Cards: negative for chest pain, palpitations, lower extremity edema GU: negative for frequency, dysuria and hematuria GI: Negative for abdominal pain, diarrhea, bleeding, constipation Skin: negative for rash and pruritus Heme: negative for easy bruising and gum/nose bleeding MS: has b/l knee pain. Neurolo:negative for headaches, dizziness, vertigo, memory problems  Psych: negative for feelings of anxiety, depression  Endocrine: negative for thyroid, diabetes issues. Allergy/Immunology-penicillin allergy Objective:  VITALS:  BP 133/70   Pulse 78   Ht _0  (1.727 m)   Wt (!) 314 lb (142.4 kg)   BMI 47.74 kg/m  PHYSICAL EXAM:  General: Alert, cooperative, no distress, appears stated age.  Waddle gait, obese Head: Normocephalic, without obvious abnormality, atraumatic. Eyes: Conjunctivae clear,  anicteric sclerae. Pupils are equal ENT did not examine because of mask Neck: Supple, symmetrical, no adenopathy, thyroid: non tender no carotid bruit and no JVD. Back: No CVA tenderness. Lungs: Clear to auscultation bilaterally. No Wheezing or Rhonchi. No rales. Heart: Regular rate and rhythm, no murmur, rub or gallop. Abdomen: Soft, non-tender,not distended. Bowel sounds normal. No masses Extremities: atraumatic, no cyanosis. No edema. No clubbing Skin: No rashes or lesions. Or bruising Lymph: Cervical, supraclavicular normal. Neurologic: Grossly non-focal, Dupuytren's contraction right hand Pertinent Labs Lab Results  CBC from 09/11/2018 Hemoglobin 11.9 Platelet 115 WBC 6.7   Labs done on 10/14/18 Sodium 138 Potassium 4.2 Creatinine 1.3 AST 70  ALT 28 Bilirubin 1.8 Albumin 2.8 Alkaline phosphatase 213  Impression/Recommendation ? 53 year old male with history of alcoholic cirrhosis, Citrobacter Corsetti right sacroiliac joint septic arthritis and bacteremia in July 2020 is on Levaquin.  Right SI joint septic arthritis due to Citrobacter resolved- completed 6 weeks of treatment with levaquin- He does not need any further antibiotic  Improved mobility.  Initially was wheelchair-bound but walking now.  Cirrhosis of the liver secondary to previous alcohol use. Has been without alcohol for many months now Recent ultrasound did not show any ascites.    Thrombocytopenia due to liver cirrhosis.  ?Anemia . ? _Hyperbilirubinemia with transaminitis.  Due to cirrhosis.   Dupuytren's contracture right hand   Obesity--Has gained 9 pounds since his last visit in sept 2020- Discussed walking and calorie control  B/l Knee pain- arthritis- worse due to the weight gain.  Follow-up with his PCP No follow up needed with me __________________________________________________ Discussed with patient in great detail.  Note:  This document was prepared using Dragon voice recognition  software and may include unintentional dictation errors.

## 2019-04-08 IMAGING — US US RENAL
1 series · 14 of 25 positions shown · non-contrast
Comparison: 05/28/2017.

CLINICAL DATA: 52-year-old male with history of acute kidney
injury.

EXAM:
RENAL / URINARY TRACT ULTRASOUND COMPLETE

[Series 1: us renal · 0.30mm/px · 14 of 39 slices shown]
[im 1/39]
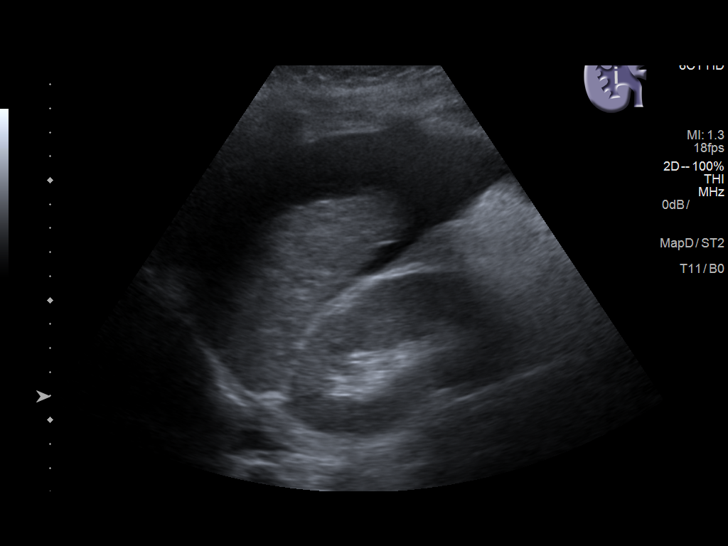
[im 4/39]
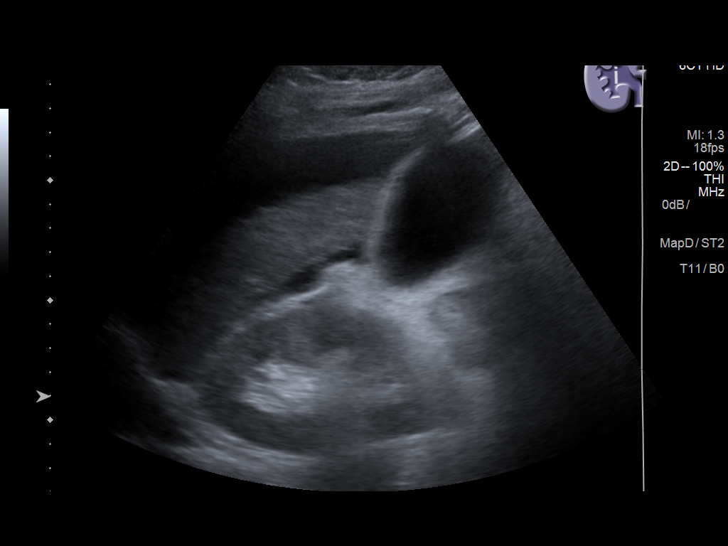
[im 7/39]
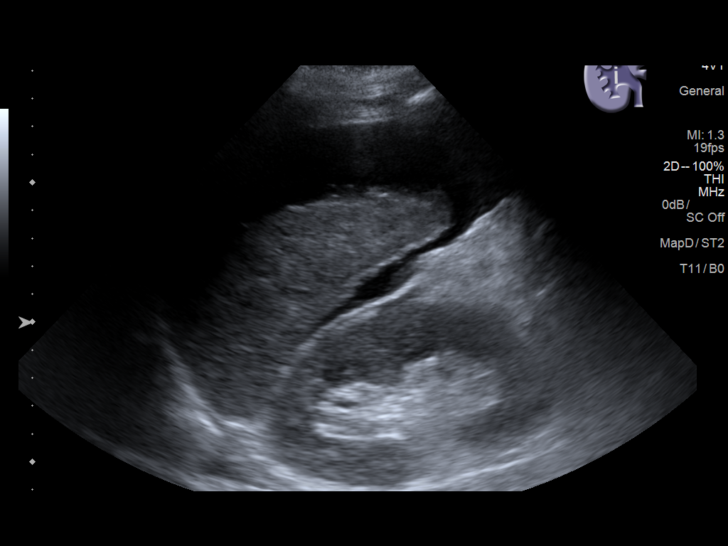
[im 10/39]
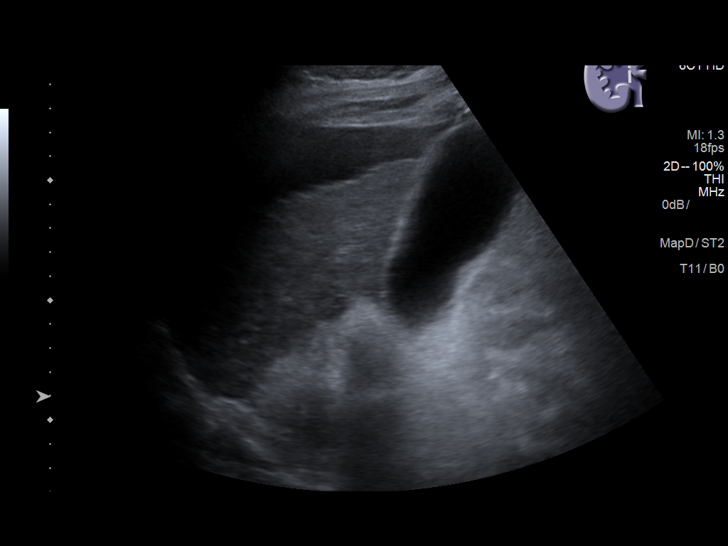
[im 13/39]
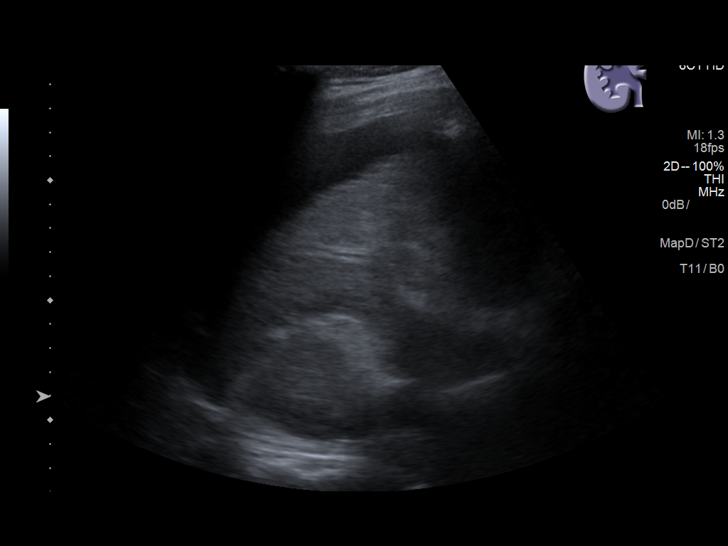
[im 15/39]
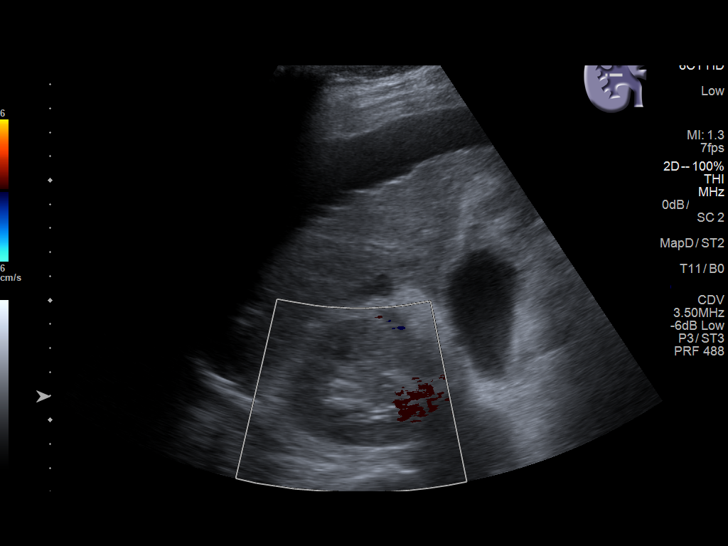
[im 18/39]
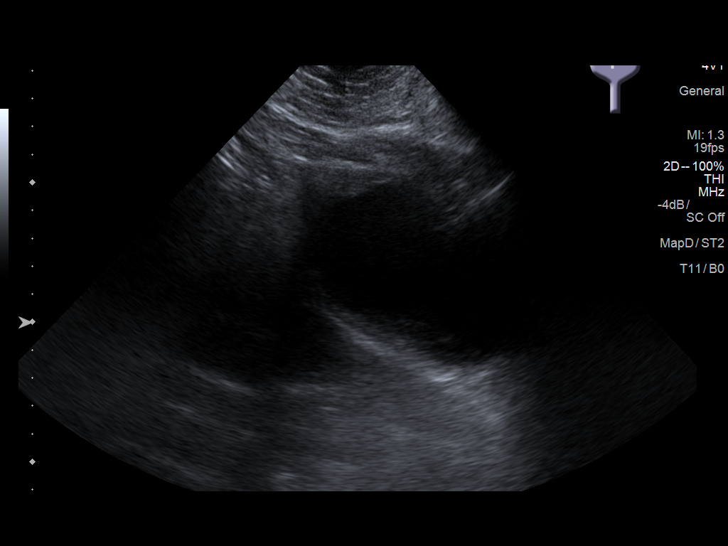
[im 21/39]
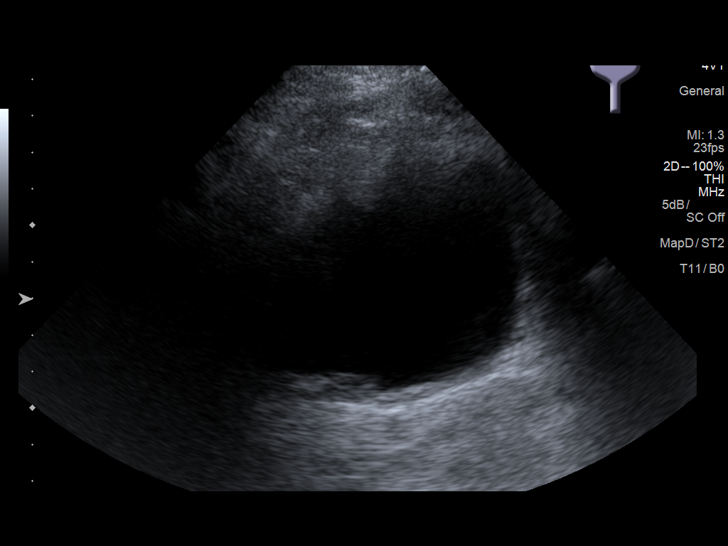
[im 24/39]
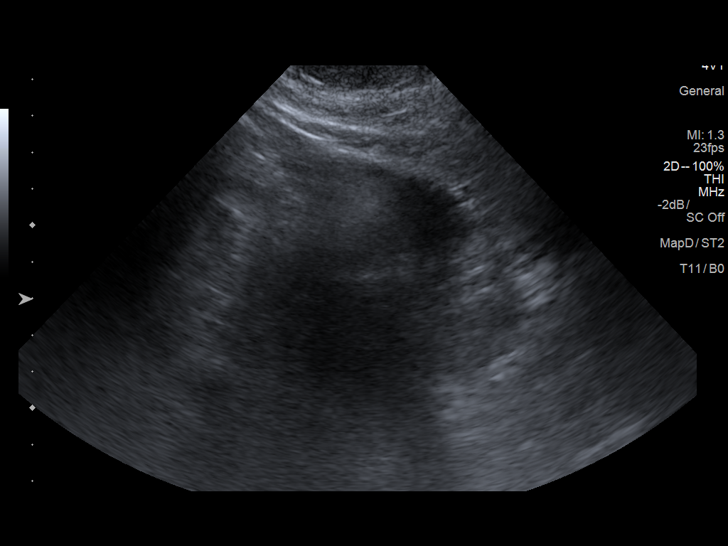
[im 26/39]
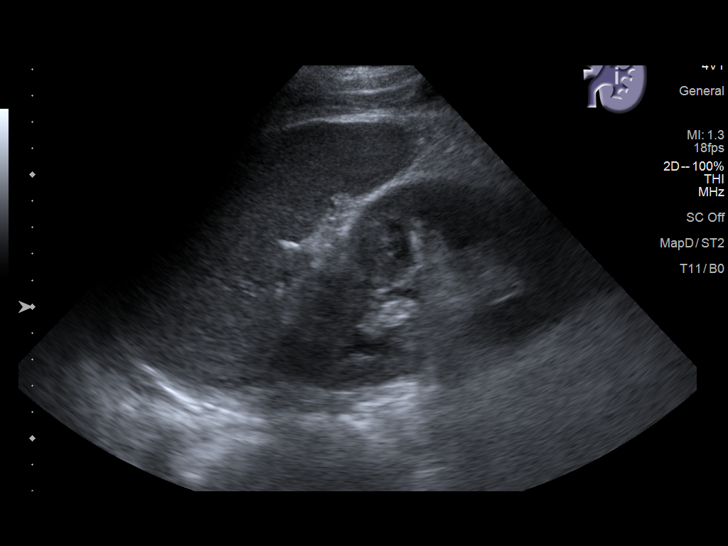
[im 29/39]
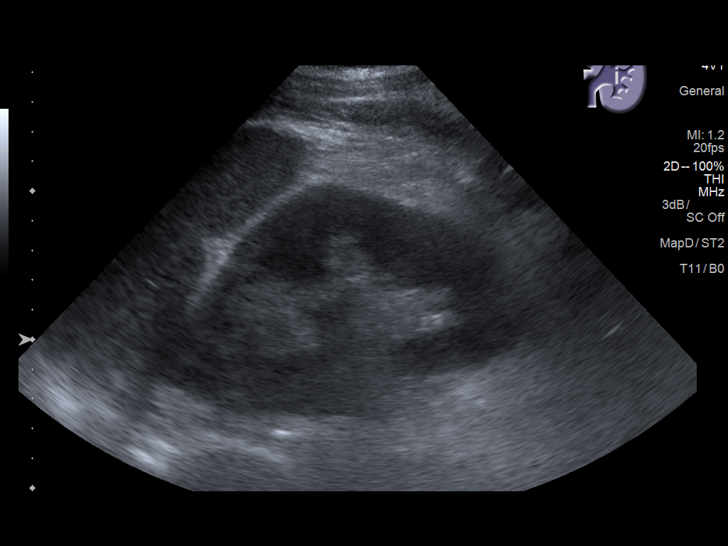
[im 32/39]
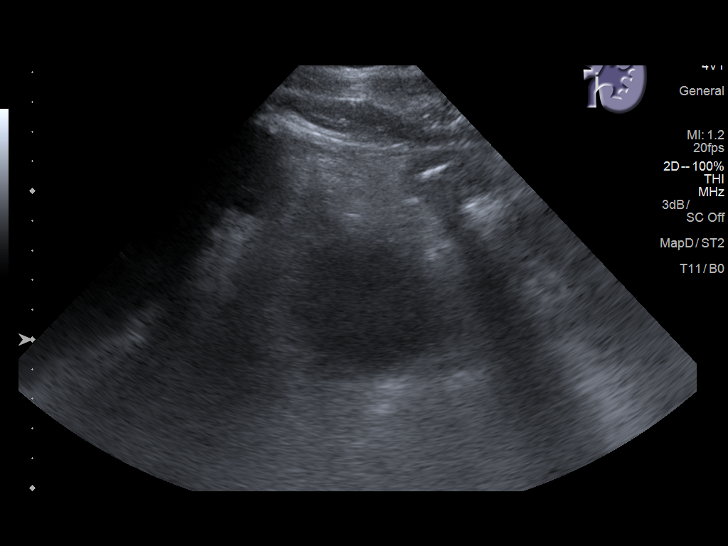
[im 35/39]
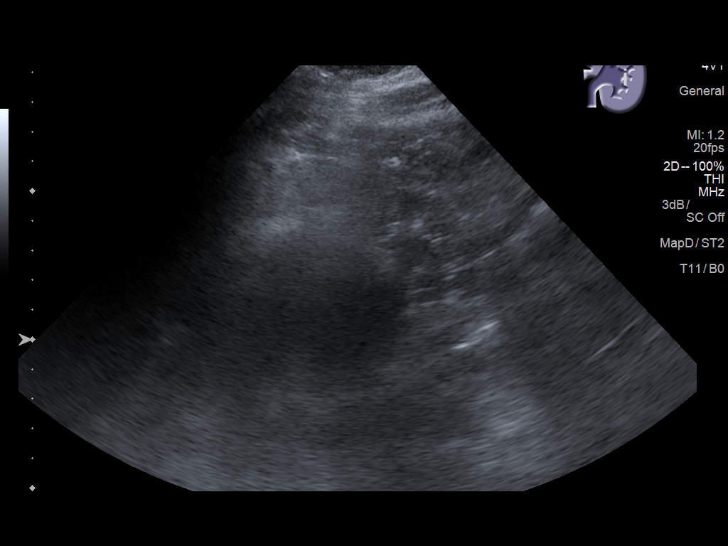
[im 39/39]
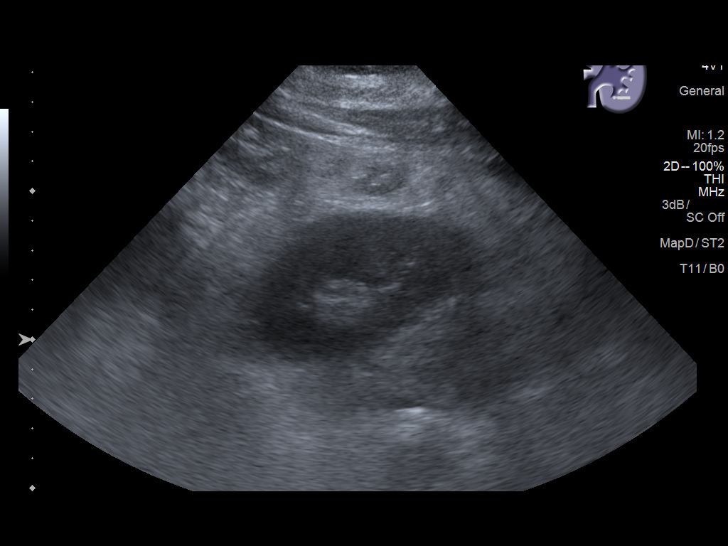

[14 of 25 positions shown; findings below may reference images not displayed]

FINDINGS: Right Kidney:

Length: 10.7 cm. Echogenicity within normal limits. No mass or
hydronephrosis visualized.

Left Kidney:

Length: 12.1 cm. Echogenicity within normal limits. No mass or
hydronephrosis visualized.

Bladder:

Appears normal for degree of bladder distention.

Other:

Moderate amount of ascites.
IMPRESSION: 1. No hydronephrosis or other acute findings.
2. Moderate volume of ascites.

## 2019-04-21 ENCOUNTER — Other Ambulatory Visit: Payer: Self-pay | Admitting: Gastroenterology

## 2019-04-21 DIAGNOSIS — K7030 Alcoholic cirrhosis of liver without ascites: Secondary | ICD-10-CM

## 2019-04-28 ENCOUNTER — Ambulatory Visit: Payer: Medicaid Other

## 2019-05-01 ENCOUNTER — Ambulatory Visit: Payer: Medicaid Other

## 2019-05-15 ENCOUNTER — Ambulatory Visit: Payer: Medicaid Other

## 2019-05-22 ENCOUNTER — Other Ambulatory Visit: Payer: Self-pay

## 2019-05-22 ENCOUNTER — Ambulatory Visit
Admission: RE | Admit: 2019-05-22 | Discharge: 2019-05-22 | Disposition: A | Discharge status: Home / Self Care | Payer: Medicaid Other | Source: Ambulatory Visit | Attending: Gastroenterology | Admitting: Gastroenterology

## 2019-05-22 DIAGNOSIS — K7030 Alcoholic cirrhosis of liver without ascites: Secondary | ICD-10-CM

## 2019-08-18 ENCOUNTER — Other Ambulatory Visit: Payer: Self-pay | Admitting: Physician Assistant

## 2019-08-18 DIAGNOSIS — M7989 Other specified soft tissue disorders: Secondary | ICD-10-CM

## 2019-08-19 ENCOUNTER — Ambulatory Visit
Admission: RE | Admit: 2019-08-19 | Discharge: 2019-08-19 | Disposition: A | Discharge status: Home / Self Care | Payer: Medicaid Other | Source: Ambulatory Visit | Attending: Physician Assistant | Admitting: Physician Assistant

## 2019-08-19 ENCOUNTER — Other Ambulatory Visit: Payer: Self-pay

## 2019-08-19 DIAGNOSIS — M7989 Other specified soft tissue disorders: Secondary | ICD-10-CM | POA: Diagnosis present

## 2019-10-28 ENCOUNTER — Other Ambulatory Visit: Payer: Self-pay | Admitting: Gastroenterology

## 2019-10-28 DIAGNOSIS — I8510 Secondary esophageal varices without bleeding: Secondary | ICD-10-CM

## 2019-10-28 DIAGNOSIS — D696 Thrombocytopenia, unspecified: Secondary | ICD-10-CM

## 2019-10-28 DIAGNOSIS — K7460 Unspecified cirrhosis of liver: Secondary | ICD-10-CM

## 2019-10-28 DIAGNOSIS — K7030 Alcoholic cirrhosis of liver without ascites: Secondary | ICD-10-CM

## 2019-11-04 ENCOUNTER — Ambulatory Visit: Payer: Medicare Other

## 2019-11-09 ENCOUNTER — Other Ambulatory Visit: Payer: Self-pay

## 2019-11-09 ENCOUNTER — Ambulatory Visit
Admission: RE | Admit: 2019-11-09 | Discharge: 2019-11-09 | Disposition: A | Discharge status: Home / Self Care | Payer: Medicare Other | Source: Ambulatory Visit | Attending: Gastroenterology | Admitting: Gastroenterology

## 2019-11-09 DIAGNOSIS — D696 Thrombocytopenia, unspecified: Secondary | ICD-10-CM

## 2019-11-09 DIAGNOSIS — K7460 Unspecified cirrhosis of liver: Secondary | ICD-10-CM | POA: Diagnosis not present

## 2019-11-09 DIAGNOSIS — I8510 Secondary esophageal varices without bleeding: Secondary | ICD-10-CM | POA: Diagnosis present

## 2019-11-09 DIAGNOSIS — K7030 Alcoholic cirrhosis of liver without ascites: Secondary | ICD-10-CM | POA: Insufficient documentation

## 2019-11-12 IMAGING — CR DG HIP (WITH OR WITHOUT PELVIS) 2-3V RIGHT
1 series · 3 of 3 positions shown · non-contrast
Comparison: None.

CLINICAL DATA: Acute right hip pain. No known injury. Intermittent
fever.

EXAM:
DG HIP (WITH OR WITHOUT PELVIS) 2-3V RIGHT

[Series 1: dg hip unilat w or w/o pelvis 2-3 views  · non-contrast · 0.14mm/px · 3 of 3 slices shown]
[im 1/3]
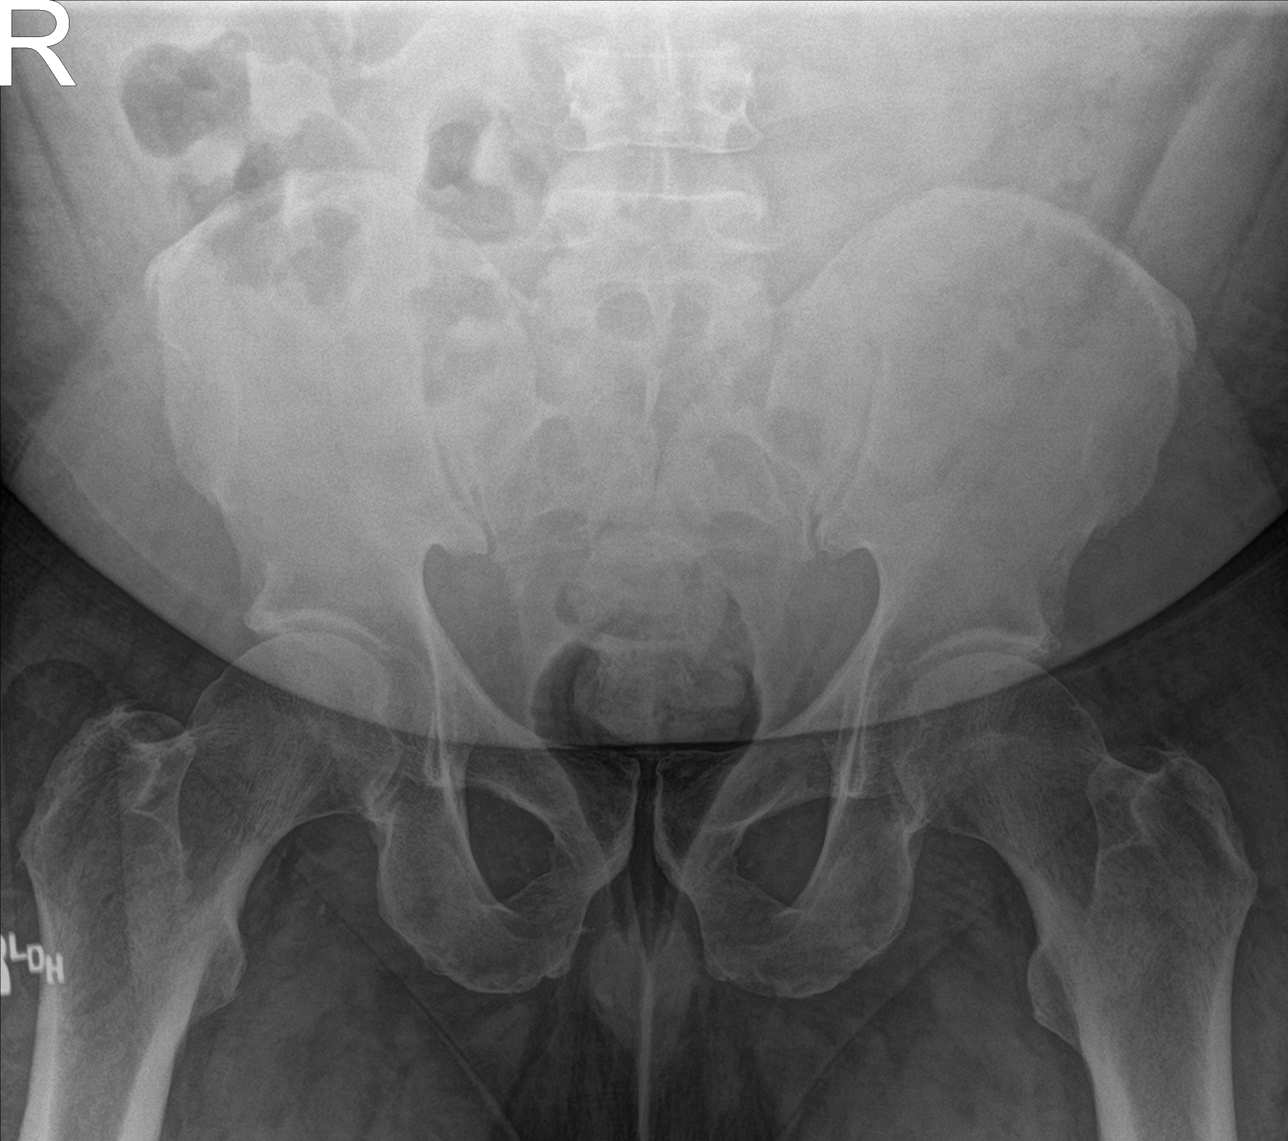
[im 2/3]
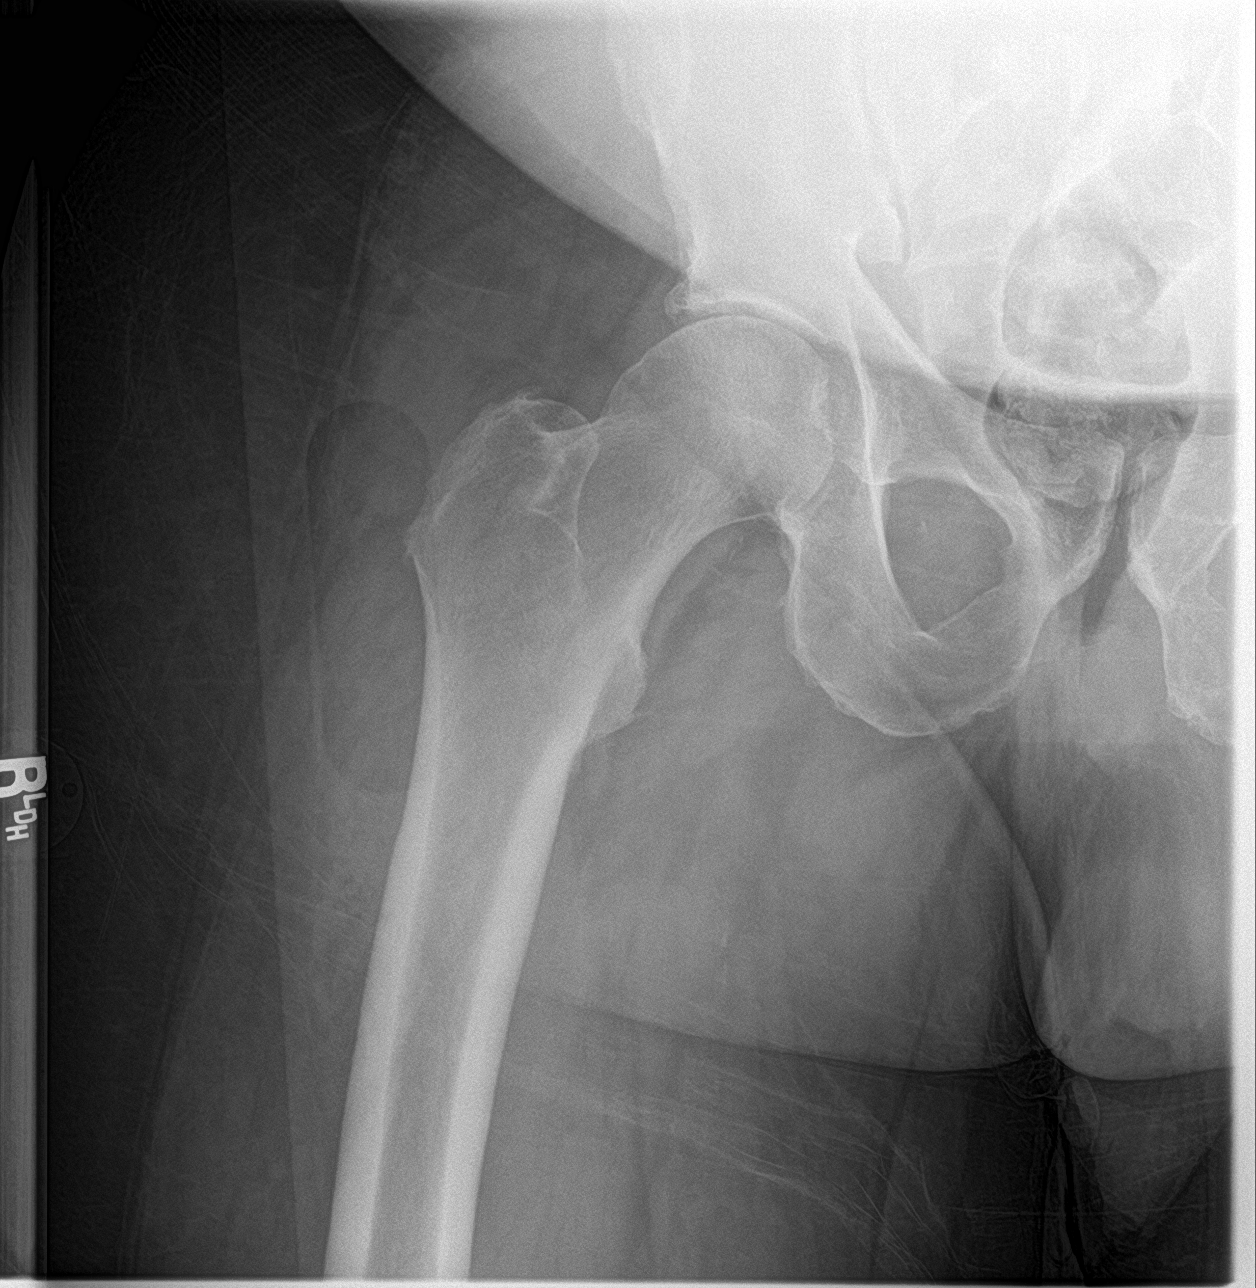
[im 3/3]
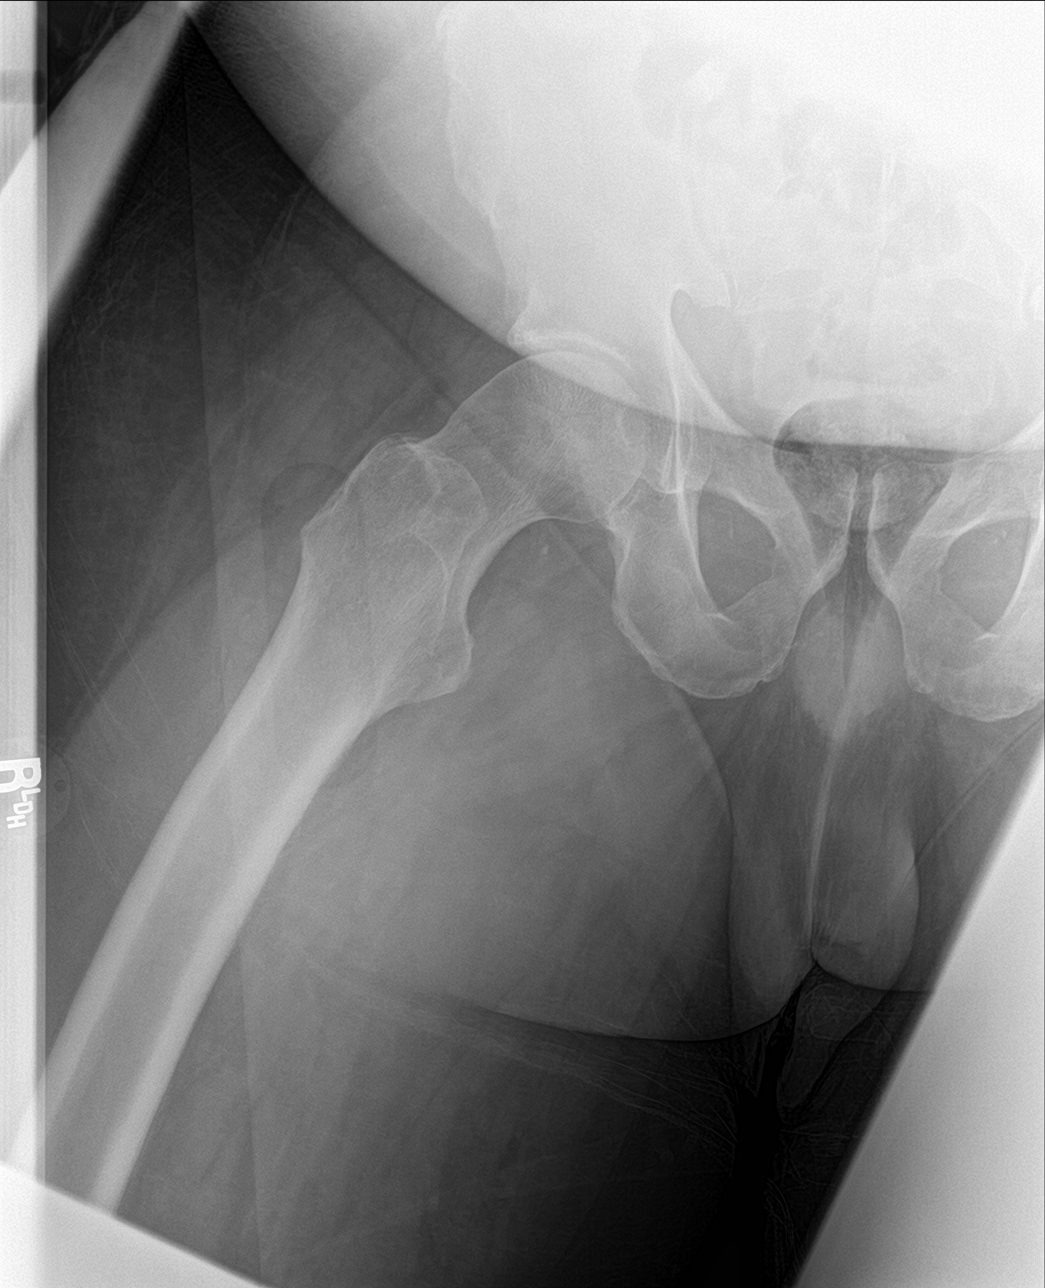

[3 of 3 positions shown; findings below may reference images not displayed]

FINDINGS: There is no evidence of hip fracture or dislocation. There is no
evidence of arthropathy or other focal bone abnormality.
IMPRESSION: Normal examination.

## 2020-06-23 ENCOUNTER — Other Ambulatory Visit: Payer: Self-pay | Admitting: Gastroenterology

## 2020-06-23 DIAGNOSIS — K7682 Hepatic encephalopathy: Secondary | ICD-10-CM

## 2020-06-23 DIAGNOSIS — I8510 Secondary esophageal varices without bleeding: Secondary | ICD-10-CM

## 2020-06-23 DIAGNOSIS — K7460 Unspecified cirrhosis of liver: Secondary | ICD-10-CM

## 2020-06-23 DIAGNOSIS — D696 Thrombocytopenia, unspecified: Secondary | ICD-10-CM

## 2020-07-18 ENCOUNTER — Ambulatory Visit
Admission: RE | Admit: 2020-07-18 | Discharge: 2020-07-18 | Disposition: A | Discharge status: Home / Self Care | Payer: Medicare Other | Source: Ambulatory Visit | Attending: Gastroenterology | Admitting: Gastroenterology

## 2020-07-18 ENCOUNTER — Other Ambulatory Visit: Payer: Self-pay

## 2020-07-18 DIAGNOSIS — K7682 Hepatic encephalopathy: Secondary | ICD-10-CM

## 2020-07-18 DIAGNOSIS — I8510 Secondary esophageal varices without bleeding: Secondary | ICD-10-CM | POA: Diagnosis present

## 2020-07-18 DIAGNOSIS — D696 Thrombocytopenia, unspecified: Secondary | ICD-10-CM | POA: Diagnosis present

## 2020-07-18 DIAGNOSIS — K7460 Unspecified cirrhosis of liver: Secondary | ICD-10-CM | POA: Insufficient documentation

## 2020-07-18 DIAGNOSIS — K7030 Alcoholic cirrhosis of liver without ascites: Secondary | ICD-10-CM | POA: Diagnosis present

## 2020-07-18 DIAGNOSIS — K7290 Hepatic failure, unspecified without coma: Secondary | ICD-10-CM | POA: Diagnosis present

## 2020-08-30 ENCOUNTER — Emergency Department: Payer: Medicare Other

## 2020-08-30 ENCOUNTER — Other Ambulatory Visit: Payer: Self-pay

## 2020-08-30 ENCOUNTER — Observation Stay
Admission: EM | Admit: 2020-08-30 | Discharge: 2020-08-31 | Disposition: A | Discharge status: Home / Self Care | Payer: Medicare Other | Attending: Internal Medicine | Admitting: Internal Medicine

## 2020-08-30 DIAGNOSIS — R4182 Altered mental status, unspecified: Secondary | ICD-10-CM

## 2020-08-30 DIAGNOSIS — Z7982 Long term (current) use of aspirin: Secondary | ICD-10-CM | POA: Diagnosis not present

## 2020-08-30 DIAGNOSIS — Y900 Blood alcohol level of less than 20 mg/100 ml: Secondary | ICD-10-CM | POA: Insufficient documentation

## 2020-08-30 DIAGNOSIS — Z79899 Other long term (current) drug therapy: Secondary | ICD-10-CM | POA: Diagnosis not present

## 2020-08-30 DIAGNOSIS — Z20822 Contact with and (suspected) exposure to covid-19: Secondary | ICD-10-CM | POA: Insufficient documentation

## 2020-08-30 DIAGNOSIS — K7290 Hepatic failure, unspecified without coma: Principal | ICD-10-CM | POA: Insufficient documentation

## 2020-08-30 DIAGNOSIS — K7460 Unspecified cirrhosis of liver: Secondary | ICD-10-CM | POA: Insufficient documentation

## 2020-08-30 DIAGNOSIS — K7682 Hepatic encephalopathy: Secondary | ICD-10-CM | POA: Diagnosis present

## 2020-08-30 DIAGNOSIS — D696 Thrombocytopenia, unspecified: Secondary | ICD-10-CM | POA: Diagnosis present

## 2020-08-30 LAB — CBC
HCT: 41.7 % (ref 39.0–52.0)
Hemoglobin: 14.5 g/dL (ref 13.0–17.0)
MCH: 33 pg (ref 26.0–34.0)
MCHC: 34.8 g/dL (ref 30.0–36.0)
MCV: 95 fL (ref 80.0–100.0)
Platelets: 63 10*3/uL — ABNORMAL LOW (ref 150–400)
RBC: 4.39 MIL/uL (ref 4.22–5.81)
RDW: 15.9 % — ABNORMAL HIGH (ref 11.5–15.5)
WBC: 5.3 10*3/uL (ref 4.0–10.5)
nRBC: 0 % (ref 0.0–0.2)

## 2020-08-30 LAB — COMPREHENSIVE METABOLIC PANEL
ALT: 34 U/L (ref 0–44)
AST: 76 U/L — ABNORMAL HIGH (ref 15–41)
Albumin: 2.8 g/dL — ABNORMAL LOW (ref 3.5–5.0)
Alkaline Phosphatase: 100 U/L (ref 38–126)
Anion gap: 12 (ref 5–15)
BUN: 11 mg/dL (ref 6–20)
CO2: 23 mmol/L (ref 22–32)
Calcium: 9.2 mg/dL (ref 8.9–10.3)
Chloride: 101 mmol/L (ref 98–111)
Creatinine, Ser: 1.14 mg/dL (ref 0.61–1.24)
GFR, Estimated: >60 mL/min (ref >60)
Glucose, Bld: 109 mg/dL — ABNORMAL HIGH (ref 70–99)
Potassium: 4 mmol/L (ref 3.5–5.1)
Sodium: 136 mmol/L (ref 135–145)
Total Bilirubin: 2.5 mg/dL — ABNORMAL HIGH (ref 0.3–1.2)
Total Protein: 7.8 g/dL (ref 6.5–8.1)

## 2020-08-30 LAB — URINE DRUG SCREEN, QUALITATIVE (ARMC ONLY)
Amphetamines, Ur Screen: NOT DETECTED
Barbiturates, Ur Screen: NOT DETECTED
Benzodiazepine, Ur Scrn: NOT DETECTED
Cannabinoid 50 Ng, Ur ~~LOC~~: POSITIVE — AB
Cocaine Metabolite,Ur ~~LOC~~: NOT DETECTED
MDMA (Ecstasy)Ur Screen: NOT DETECTED
Methadone Scn, Ur: NOT DETECTED
Opiate, Ur Screen: NOT DETECTED
Phencyclidine (PCP) Ur S: NOT DETECTED
Tricyclic, Ur Screen: NOT DETECTED

## 2020-08-30 LAB — URINALYSIS, ROUTINE W REFLEX MICROSCOPIC
Bacteria, UA: NONE SEEN
Bilirubin Urine: NEGATIVE
Glucose, UA: NEGATIVE mg/dL
Ketones, ur: NEGATIVE mg/dL
Leukocytes,Ua: NEGATIVE
Nitrite: NEGATIVE
Protein, ur: 30 mg/dL — AB
RBC / HPF: >50 RBC/hpf — ABNORMAL HIGH (ref 0–5)
Specific Gravity, Urine: 1.016 (ref 1.005–1.030)
pH: 5 (ref 5.0–8.0)

## 2020-08-30 LAB — ETHANOL: Alcohol, Ethyl (B): <10 mg/dL (ref <10)

## 2020-08-30 LAB — TROPONIN I (HIGH SENSITIVITY): Troponin I (High Sensitivity): 13 ng/L (ref <18)

## 2020-08-30 LAB — AMMONIA: Ammonia: 86 umol/L — ABNORMAL HIGH (ref 9–35)

## 2020-08-30 LAB — RESP PANEL BY RT-PCR (FLU A&B, COVID) ARPGX2
Influenza A by PCR: NEGATIVE
Influenza B by PCR: NEGATIVE
SARS Coronavirus 2 by RT PCR: NEGATIVE

## 2020-08-30 MED ORDER — POTASSIUM CHLORIDE CRYS ER 20 MEQ PO TBCR
40.0000 meq | EXTENDED_RELEASE_TABLET | Freq: Two times a day (BID) | ORAL | Status: DC
  Administered 2020-08-31: 40 meq via ORAL
  Filled 2020-08-30: qty 2

## 2020-08-30 MED ORDER — MIDAZOLAM HCL 5 MG/5ML IJ SOLN
2.0000 mg | Freq: Once | INTRAMUSCULAR | Status: AC
  Administered 2020-08-30: 2 mg via INTRAMUSCULAR
  Filled 2020-08-30: qty 5

## 2020-08-30 MED ORDER — DROPERIDOL 2.5 MG/ML IJ SOLN
5.0000 mg | Freq: Once | INTRAMUSCULAR | Status: AC
  Administered 2020-08-30: 5 mg via INTRAMUSCULAR
  Filled 2020-08-30: qty 2

## 2020-08-30 MED ORDER — RIFAXIMIN 550 MG PO TABS
550.0000 mg | ORAL_TABLET | Freq: Two times a day (BID) | ORAL | Status: DC
  Administered 2020-08-31: 550 mg via ORAL
  Filled 2020-08-30 (×3): qty 1

## 2020-08-30 MED ORDER — HALOPERIDOL LACTATE 5 MG/ML IJ SOLN: 2.0000 mg | Freq: Four times a day (QID) | INTRAMUSCULAR | Status: DC | PRN

## 2020-08-30 MED ORDER — LACTULOSE 10 GM/15ML PO SOLN
20.0000 g | Freq: Three times a day (TID) | ORAL | Status: DC
  Administered 2020-08-31: 20 g via ORAL
  Filled 2020-08-30: qty 30

## 2020-08-30 MED ORDER — HALOPERIDOL LACTATE 5 MG/ML IJ SOLN: 2.0000 mg | Freq: Once | INTRAMUSCULAR | Status: DC

## 2020-08-30 MED ORDER — SPIRONOLACTONE 25 MG PO TABS
25.0000 mg | ORAL_TABLET | Freq: Every day | ORAL | Status: DC
  Administered 2020-08-31: 25 mg via ORAL
  Filled 2020-08-30: qty 1

## 2020-08-30 MED ORDER — FUROSEMIDE 40 MG PO TABS
20.0000 mg | ORAL_TABLET | Freq: Every day | ORAL | Status: DC
  Administered 2020-08-31: 20 mg via ORAL
  Filled 2020-08-30: qty 1

## 2020-08-30 MED ORDER — LACTULOSE 10 GM/15ML PO SOLN
20.0000 g | Freq: Once | ORAL | Status: DC
  Filled 2020-08-30: qty 30

## 2020-08-30 MED ORDER — SODIUM CHLORIDE 0.9% FLUSH: 3.0000 mL | Freq: Two times a day (BID) | INTRAVENOUS | Status: DC

## 2020-08-30 NOTE — ED Provider Notes (Signed)
[facility] Emergency Department Provider Note  ____________________________________________   Event Date/Time   First MD Initiated Contact with Patient 08/30/20 1644     (approximate)  I have reviewed the triage vital signs and the nursing notes.   HISTORY  Chief Complaint Altered Mental Status    HPI Jordan Hayden is a 55 y.o. male with prior history of alcohol abuse with alcoholic cirrhosis who comes in with concern for hepatic encephalopathy.  According to girlfriend who brought him in he has been acting more confused.  He has been reportedly taking his lactulose.  Patient is alert and oriented x2 and is often contraindicating himself.  He states that he is not sure why he is here and he denies feeling confused but then gets verbally aggressive with staff and states that he will stay in the hospital but then states that he needs to leave the hospital.  Unable to get full HPI from patient due to altered mental status.          Past Medical History:  Diagnosis Date   Alcohol abuse    Alcoholic cirrhosis ([place])    Alcoholism ([place])    Arthritis    Cirrhosis ([place])     Patient Active Problem List   Diagnosis Date Noted   Sacroiliitis ([place]) 09/30/2018   Bacteremia due to Gram-negative bacteria 09/12/2018   Cellulitis 06/16/2018   Morbid obesity with BMI of 40.0-44.9, adult ([place]) 02/12/2018   Sepsis ([place]) 06/28/2017   Iron deficiency anemia 05/19/2017   Thrombocytopenia ([place]) 04/01/2017   Anasarca 02/29/2016   Advanced cirrhosis of liver ([place]) 02/17/2016   Dupuytren's contracture of right hand 02/17/2016   Hyperbilirubinemia 02/17/2016    Past Surgical History:  Procedure Laterality Date   ESOPHAGOGASTRODUODENOSCOPY (EGD) WITH PROPOFOL N/A 07/01/2017   Procedure: ESOPHAGOGASTRODUODENOSCOPY (EGD) WITH PROPOFOL;  Surgeon: Lucilla Lame, MD;  Location: [facility] ENDOSCOPY;  Service: Endoscopy;  Laterality: N/A;    Prior to Admission medications    Medication Sig Start Date End Date Taking? Authorizing Provider  aspirin EC 81 MG tablet Take 81 mg by mouth daily. 03/05/16   [provider]  ergocalciferol (VITAMIN D2) 50000 units capsule Take 1 capsule (50,000 Units total) by mouth once a week. 05/19/17   Earlie Server, MD  fluticasone (FLONASE) 50 MCG/ACT nasal spray Place 2 sprays into both nostrils daily. 07/02/17   Salary, Avel Peace, MD  furosemide (LASIX) 20 MG tablet Take 1 tablet (20 mg total) by mouth daily. 07/02/17 11/18/18  Salary, Holly Bodily D, MD  hydrOXYzine (ATARAX/VISTARIL) 25 MG tablet Take 25 mg by mouth 3 (three) times daily as needed. 04/01/17   [provider]  lactulose (CHRONULAC) 10 GM/15ML solution Take 30 mLs by mouth 2 (two) times daily. 08/28/18   [provider]  levofloxacin (LEVAQUIN) 750 MG tablet Take 1 tablet (750 mg total) by mouth daily. 09/11/18   Tsosie Billing, MD  meloxicam (MOBIC) 15 MG tablet Take 1 tablet (15 mg total) by mouth daily. 07/22/18   Cuthriell, Charline Bills, PA-C  pantoprazole (PROTONIX) 40 MG tablet Take 1 tablet (40 mg total) by mouth daily. 07/02/17 11/18/18  Salary, Avel Peace, MD  potassium chloride SA (K-DUR,KLOR-CON) 20 MEQ tablet Take 2 tablets (40 mEq total) by mouth 2 (two) times daily. 07/02/17   Salary, Avel Peace, MD  spironolactone (ALDACTONE) 25 MG tablet Take 0.5 tablets (12.5 mg total) by mouth 2 (two) times daily. 07/02/17   Salary, Avel Peace, MD    Allergies Hydrocodone-acetaminophen and  Penicillins  Family History  Problem Relation Age of Onset   Hypertension Mother    Diabetes Father    Cervical cancer Sister    Cirrhosis Brother     Social History Social History   Tobacco Use   Smoking status: Never   Smokeless tobacco: Never  Vaping Use   Vaping Use: Never used  Substance Use Topics   Alcohol use: No    Comment: quit 2 years    Drug use: No      Review of Systems Unable to get full review of system from patient due to altered  mental status  ________________________   PHYSICAL EXAM:  VITAL SIGNS: ED Triage Vitals [08/30/20 1457]  Enc Vitals Group     BP (!) 191/96     Pulse Rate 99     Resp 20     Temp 99.2 F (37.3 C)     Temp Source Oral     SpO2 99 %     Weight (!) 314 lb (142.4 kg)     Height '5\' 8"'$  (1.727 m)     Head Circumference      Peak Flow      Pain Score      Pain Loc      Pain Edu?      Excl. in [place]?     Constitutional: Alert and oriented x2.  Confused Eyes: Conjunctivae are normal. EOMI. Head: Atraumatic. Nose: No congestion/rhinnorhea. Mouth/Throat: Mucous membranes are moist.   Neck: No stridor. Trachea Midline. FROM Cardiovascular: Normal rate, regular rhythm. Grossly normal heart sounds.  Good peripheral circulation. Respiratory: Normal respiratory effort.  No retractions. Lungs CTAB. Gastrointestinal: Soft and nontender. No distention. No abdominal bruits.  Musculoskeletal: No lower extremity tenderness nor edema.  No joint effusions.  +asterixis  Neurologic:  Normal speech and language. No gross focal neurologic deficits are appreciated.  Moving all extremities. Skin:  Skin is warm, dry and intact. No rash noted. Psychiatric: Unable to fully assess due to confusion GU: Deferred   ____________________________________________   LABS (all labs ordered are listed, but only abnormal results are displayed)  Labs Reviewed  COMPREHENSIVE METABOLIC PANEL - Abnormal; Notable for the following components:      Result Value   Glucose, Bld 109 (*)    Albumin 2.8 (*)    AST 76 (*)    Total Bilirubin 2.5 (*)    All other components within normal limits  CBC - Abnormal; Notable for the following components:   RDW 15.9 (*)    Platelets 63 (*)    All other components within normal limits  AMMONIA - Abnormal; Notable for the following components:   Ammonia 86 (*)    All other components within normal limits  CBG MONITORING, ED    ____________________________________________   ED ECG REPORT I, Vanessa [place], the attending physician, personally viewed and interpreted this ECG.  Sinus tachycardia rate of 101, no ST elevation, no T wave inversions, QTC is 533 ____________________________________________  RADIOLOGY I, Vanessa [place], personally viewed and evaluated these images (plain radiographs) as part of my medical decision making, as well as reviewing the written report by the radiologist.  ED MD interpretation: No intracranial hemorrhage  Official radiology report(s): CT HEAD WO CONTRAST (5MM)  Result Date: 08/30/2020 CLINICAL DATA:  Altered mental status. EXAM: CT HEAD WITHOUT CONTRAST TECHNIQUE: Contiguous axial images were obtained from the base of the skull through the vertex without intravenous contrast. COMPARISON:  None. FINDINGS: Brain: No evidence  of acute infarction, hemorrhage, hydrocephalus, extra-axial collection or mass lesion/mass effect. Scattered mild periventricular and subcortical white matter hypodensities are nonspecific, but favored to reflect chronic microvascular ischemic changes. Vascular: Atherosclerotic vascular calcification of the carotid siphons. No hyperdense vessel. Skull: Normal. Negative for fracture or focal lesion. Sinuses/Orbits: No acute finding. Other: None. IMPRESSION: 1. No acute intracranial abnormality. Electronically Signed   By: Titus Dubin M.D.   On: 08/30/2020 18:52    ____________________________________________   PROCEDURES  Procedure(s) performed (including Critical Care):  .1-3 Lead EKG Interpretation  Date/Time: 08/30/2020 7:34 PM Performed by: Vanessa [place], MD Authorized by: Vanessa [place], MD     Interpretation: normal     ECG rate:  90s   ECG rate assessment: normal     Rhythm: sinus rhythm     Ectopy: none     Conduction: normal     ____________________________________________   INITIAL IMPRESSION / ASSESSMENT AND PLAN / ED COURSE  Jordan Hayden was evaluated in Emergency Department on 08/30/2020 for the symptoms described in the history of present illness. He was evaluated in the context of the global COVID-19 pandemic, which necessitated consideration that the patient might be at risk for infection with the SARS-CoV-2 virus that causes COVID-19. Institutional protocols and algorithms that pertain to the evaluation of patients at risk for COVID-19 are in a state of rapid change based on information released by regulatory bodies including the CDC and federal and state organizations. These policies and algorithms were followed during the patient's care in the ED.     Patient comes in with concern for confusion.  I am unable to figure out if patient had any history of trauma site also got a CT head to evaluate for intercranial hemorrhage.  Labs ordered evaluate for pneumonia, Electra abnormalities, AKI, UTI, ethanol and drug use.  Unfortunately patient is only alert and oriented x2 history to get verbally aggressive and attempting to leave.  I feel he patient does not have the capacity to make this decision.  Patient does seem confused and at 1 point was stated that his girlfriend was there next to him and there was nobody there.  I am concerned that he could be hallucinating.  I feel that he could be a danger to himself and that he is not safe for discharge home therefore I placed patient under IVC.  I gave him 5 mg of IM droperidol due to agitation with staff.  Afterwards EKG showed a prolonged QTC so I will hold off on further QTC prolonging medications.  Instead I then gave him a dose of IM Versed.  Given my concern for hepatic encephalopathy I will discuss with hospital team for admission      ____________________________________________   FINAL CLINICAL IMPRESSION(S) / ED DIAGNOSES   Final diagnoses:  Altered mental status, unspecified altered mental status type  Hepatic encephalopathy ([place])      MEDICATIONS GIVEN DURING  THIS VISIT:  Medications  lactulose (CHRONULAC) 10 GM/15ML solution 20 g (has no administration in time range)  droperidol (INAPSINE) 2.5 MG/ML injection 5 mg (5 mg Intramuscular Given 08/30/20 1902)  midazolam (VERSED) 5 MG/5ML injection 2 mg (2 mg Intramuscular Given 08/30/20 1932)     ED Discharge Orders     None        Note:  This document was prepared using Dragon voice recognition software and may include unintentional dictation errors.    Vanessa [place], MD 08/30/20 [phone]

## 2020-08-30 NOTE — ED Notes (Signed)
Patient continues to sleep. Chest rise and fall observed. No behavior issues presently. NSR noted on monitor. Bed alarm is on.

## 2020-08-30 NOTE — ED Notes (Addendum)
Spoke with telemonitoring staff, Philis Nettle, states that due to pt being IVC'd, they are unable to utilize the telemonitoring system without obtaining approval. Charge RN notified and aware. Patient is currently calm and sleeping, chest rise and fall observed, bed alarm on. Waiting on approval for telemonitoring.

## 2020-08-30 NOTE — ED Notes (Signed)
Pt became dangerously combative in CT. Pt back in room, EDP and other nurse were in room and pt was very confused and agitated. Pt does not have IV line yet. Will administer ordered IM sedative once available in pyxis. Pt IVCd by EDP for own safety.

## 2020-08-30 NOTE — ED Notes (Signed)
Pt to CT. Girlfriend at bedside.

## 2020-08-30 NOTE — ED Notes (Signed)
Other nurse noticed that girlfriend was walking down hall talking loudly, stating "I wouldn't have brought him here if I knew he was acting like this". Girlfriend walked out of room actually while EDP was at bedside attempting to calm pt down when he was getting OOB.  ED tech states pt still pulling everything off and trying to leave. CN and ED tech now at bedside.

## 2020-08-30 NOTE — ED Triage Notes (Signed)
Pt comes into the ED via EMS from home with c/o AMS, wife reported the pt was not acting normal this morning and pt has a known hx of liver cirrhosis with elevated ammonia levels and gave him lactulose at 1315..  191/96 102HR [id number]%RA

## 2020-08-30 NOTE — ED Notes (Signed)
Pt denies CP, SOB. Is answering orientation questions appropriately but was confused in triage. Significant tremor noted in both hands. Ammonia is elevated. Told pt to call nurse when needs to void and can use urinal.

## 2020-08-30 NOTE — ED Notes (Signed)
Telemonitoring at the bedside, #[id number]. Spoke with Philis Nettle and provided report. Telemonitor is on along with bed alarm.

## 2020-08-30 NOTE — H&P (Signed)
+ History and Physical    Jordan Hayden JKK:[id number] DOB: 1965-12-30 DOA: 08/30/2020  PCP: Biruk Body, MD  Patient coming from: Home via EMS  I have personally briefly reviewed patient's old medical records in [place]  Chief Complaint: Altered mental status  HPI: Jordan Hayden is a 55 y.o. 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   Patient presented to the ED due to altered mental status and abnormal behavior while at home.  He was reportedly given a dose of lactulose by his significant other without improvement therefore EMS were called.  Patient was very agitated and combative in the ED and was given IM droperidol and Versed.  On my evaluation he has calmed down and is answering questions appropriately.  He says he does take lactulose as prescribed and is having regular bowel movements, at least 2/day with last bowel movement earlier today.  He has not had any nausea, vomiting, abdominal pain.  He denies any alcohol use, he says he quit drinking 4 years ago.  His only current complaints are feeling cold and wanting to move into a regular bed.  ED Course:  Initial vitals show BP 191/96, pulse 99, RR 20, temp 99.2 F, SPO2 99% on room air.  Labs show sodium 136, potassium 4.0, bicarb 23, BUN 11, creatinine 1.14, serum glucose 109, AST 76, ALT 34, alk phos 100, total bilirubin 2.5, WBC 5.3, hemoglobin 14.5, platelets 63,000, serum ethanol <10, ammonia 86.  UDS positive for cannabinoids.  Urinalysis negative for UTI.  High-sensitivity troponin 13.  SARS-CoV-2 PCR panel in process.  CT head without contrast negative for acute intracranial abnormality.  Per EDP patient has been combative with question of  hallucinations and attempting to leave.  He was placed under IVC due to concern of danger to self on not safe to discharge to home.  He was given IM droperidol 5 mg and IM Versed 2 mg.  Oral lactulose was ordered but not yet administered.  The hospitalist service was consulted to admit for further evaluation and management.  Review of Systems:  All systems reviewed and are negative except as documented in history of present illness above.   Past Medical History:  Diagnosis Date   Alcohol abuse    Alcoholic cirrhosis ([place])    Alcoholism ([place])    Arthritis    Cirrhosis ([place])     Past Surgical History:  Procedure Laterality Date   ESOPHAGOGASTRODUODENOSCOPY (EGD) WITH PROPOFOL N/A 07/01/2017   Procedure: ESOPHAGOGASTRODUODENOSCOPY (EGD) WITH PROPOFOL;  Surgeon: Lucilla Lame, MD;  Location: [facility] ENDOSCOPY;  Service: Endoscopy;  Laterality: N/A;    Social History:  reports that he has never smoked. He has never used smokeless tobacco. He reports that he does not drink alcohol and does not use drugs.  Allergies  Allergen Reactions   Hydrocodone-Acetaminophen Swelling    Mouth Swelling    Penicillins Swelling    Family History  Problem Relation Age of Onset   Hypertension Mother    Diabetes Father    Cervical cancer Sister    Cirrhosis Brother      Prior to Admission medications   Medication Sig Start Date End Date Taking? Authorizing Provider  aspirin EC 81 MG tablet Take 81 mg by mouth daily. 03/05/16   [provider]  ergocalciferol (VITAMIN  D2) 50000 units capsule Take 1 capsule (50,000 Units total) by mouth once a week. 05/19/17   Earlie Server, MD  fluticasone (FLONASE) 50 MCG/ACT nasal spray Place 2 sprays into both nostrils daily. 07/02/17   Salary, Avel Peace, MD  furosemide (LASIX) 20 MG tablet Take 1 tablet (20 mg total) by mouth daily. 07/02/17 11/18/18  Salary, Holly Bodily D, MD  hydrOXYzine (ATARAX/VISTARIL) 25 MG tablet Take 25 mg by mouth 3 (three) times daily as  needed. 04/01/17   [provider]  lactulose (CHRONULAC) 10 GM/15ML solution Take 30 mLs by mouth 2 (two) times daily. 08/28/18   [provider]  levofloxacin (LEVAQUIN) 750 MG tablet Take 1 tablet (750 mg total) by mouth daily. 09/11/18   Tsosie Billing, MD  meloxicam (MOBIC) 15 MG tablet Take 1 tablet (15 mg total) by mouth daily. 07/22/18   Cuthriell, Charline Bills, PA-C  pantoprazole (PROTONIX) 40 MG tablet Take 1 tablet (40 mg total) by mouth daily. 07/02/17 11/18/18  Salary, Avel Peace, MD  potassium chloride SA (K-DUR,KLOR-CON) 20 MEQ tablet Take 2 tablets (40 mEq total) by mouth 2 (two) times daily. 07/02/17   Salary, Avel Peace, MD  spironolactone (ALDACTONE) 25 MG tablet Take 0.5 tablets (12.5 mg total) by mouth 2 (two) times daily. 07/02/17   Loney Hering D, MD    Physical Exam: Vitals:   08/30/20 1649 08/30/20 1653 08/30/20 1800 08/30/20 2013  BP: (!) 164/87  (!) 165/77 (!) 151/91  Pulse:  90 91 95  Resp:  (!) _0 Temp:      TempSrc:      SpO2:  97% 99% 98%  Weight:      Height:       Constitutional: Morbidly obese man resting in bed in the right lateral decubitus position, initially sleeping but awakens easily and answers questions appropriately Eyes: PERRL, lids and conjunctivae normal ENMT: Mucous membranes are moist. Posterior pharynx clear of any exudate or lesions.Normal dentition.  Neck: normal, supple, no masses. Respiratory: clear to auscultation bilaterally, no wheezing, no crackles. Normal respiratory effort. No accessory muscle use.  Cardiovascular: Regular rate and rhythm, no murmurs / rubs / gallops.  +1 bilateral lower extremity edema. 2+ pedal pulses. Abdomen: Obese abdomen, no tenderness, no masses palpated. No hepatosplenomegaly. Musculoskeletal: no clubbing / cyanosis. No joint deformity upper and lower extremities. Good ROM, no contractures. Normal muscle tone.  Skin: no rashes, lesions, ulcers. No induration Neurologic: CN 2-12  grossly intact. Sensation intact. Strength 5/5 in all 4.  Psychiatric:  Alert and oriented x 3.  Somewhat irritable mood.   Labs on Admission: I have personally reviewed following labs and imaging studies  CBC: Recent Labs  Lab 08/30/20 1507  WBC 5.3  HGB 14.5  HCT 41.7  MCV 95.0  PLT 63*   Basic Metabolic Panel: Recent Labs  Lab 08/30/20 1507  NA 136  K 4.0  CL 101  CO2 23  GLUCOSE 109*  BUN 11  CREATININE 1.14  CALCIUM 9.2   GFR: Estimated Creatinine Clearance: 103.9 mL/min (by C-G formula based on SCr of 1.14 mg/dL). Liver Function Tests: Recent Labs  Lab 08/30/20 1507  AST 76*  ALT 34  ALKPHOS 100  BILITOT 2.5*  PROT 7.8  ALBUMIN 2.8*   No results for input(s): LIPASE, AMYLASE in the last 168 hours. Recent Labs  Lab 08/30/20 1448  AMMONIA 86*   Coagulation Profile: No results for input(s): INR, PROTIME in the last 168 hours. Cardiac Enzymes: No results  for input(s): CKTOTAL, CKMB, CKMBINDEX, TROPONINI in the last 168 hours. BNP (last 3 results) No results for input(s): PROBNP in the last 8760 hours. HbA1C: No results for input(s): HGBA1C in the last 72 hours. CBG: No results for input(s): GLUCAP in the last 168 hours. Lipid Profile: No results for input(s): CHOL, HDL, LDLCALC, TRIG, CHOLHDL, LDLDIRECT in the last 72 hours. Thyroid Function Tests: No results for input(s): TSH, T4TOTAL, FREET4, T3FREE, THYROIDAB in the last 72 hours. Anemia Panel: No results for input(s): VITAMINB12, FOLATE, FERRITIN, TIBC, IRON, RETICCTPCT in the last 72 hours. Urine analysis:    Component Value Date/Time   COLORURINE YELLOW (A) 08/30/2020 1428   APPEARANCEUR HAZY (A) 08/30/2020 1428   LABSPEC 1.016 08/30/2020 1428   PHURINE 5.0 08/30/2020 1428   GLUCOSEU NEGATIVE 08/30/2020 1428   HGBUR LARGE (A) 08/30/2020 1428   BILIRUBINUR NEGATIVE 08/30/2020 1428   KETONESUR NEGATIVE 08/30/2020 1428   PROTEINUR 30 (A) 08/30/2020 1428   NITRITE NEGATIVE 08/30/2020 1428    LEUKOCYTESUR NEGATIVE 08/30/2020 1428    Radiological Exams on Admission: CT HEAD WO CONTRAST (5MM)  Result Date: 08/30/2020 CLINICAL DATA:  Altered mental status. EXAM: CT HEAD WITHOUT CONTRAST TECHNIQUE: Contiguous axial images were obtained from the base of the skull through the vertex without intravenous contrast. COMPARISON:  None. FINDINGS: Brain: No evidence of acute infarction, hemorrhage, hydrocephalus, extra-axial collection or mass lesion/mass effect. Scattered mild periventricular and subcortical white matter hypodensities are nonspecific, but favored to reflect chronic microvascular ischemic changes. Vascular: Atherosclerotic vascular calcification of the carotid siphons. No hyperdense vessel. Skull: Normal. Negative for fracture or focal lesion. Sinuses/Orbits: No acute finding. Other: None. IMPRESSION: 1. No acute intracranial abnormality. Electronically Signed   By: Titus Dubin M.D.   On: 08/30/2020 18:52    EKG: Personally reviewed. Sinus rhythm without acute ischemic changes, QTC 533.  QTC more prolonged when compared to prior.  Assessment/Plan Principal Problem:   Hepatic encephalopathy (HCC) Active Problems:   Advanced cirrhosis of liver (HCC)   Thrombocytopenia (HCC)   Jordan Hayden is a 55 y.o. 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  Hepatic encephalopathy in setting of advanced alcohol-related liver cirrhosis with portal hypertension and grade 1 esophageal varices: Initially very agitated and combative, now calmed down after receiving IM droperidol and Versed in the ED.  Mental status appears to be clearing up. -Start on lactulose 20 g 3 times daily; titrate to 2-3 bowel movements daily -Continue rifaximin 550 mg twice daily -Continue  home Lasix and spironolactone -Admit to stepdown unit for now given significant agitation  Thrombocytopenia: Chronic secondary to cirrhosis.  No sign of obvious bleeding.  Continue to monitor.  DVT prophylaxis: SCDs Code Status: Full code Family Communication: None present on admission Disposition Plan: From home and likely discharge to home pending improvement mental status Consults called: None Level of care: Stepdown Admission status:  Status is: Observation  The patient remains OBS appropriate and will d/c before 2 midnights.  Dispo: The patient is from: Home              Anticipated d/c is to: Home              Patient currently is not medically stable to d/c.   Difficult to place patient No  Zada Finders MD [facility]  If 7PM-7AM, please contact night-coverage [email or web address]  08/30/2020, 8:53 PM

## 2020-08-30 NOTE — ED Notes (Signed)
Pt now asleep

## 2020-08-30 NOTE — ED Notes (Signed)
Pt attempting to get OOB, very confused. Pt stating needs to pee, then forgetting what he was doing. Combative at this time, trying to walk to bathroom. Stating "you can't make me stay here!". CN called and informed that pt is fall risk. CN will get sitter. Pt still sitting at side of bed.

## 2020-08-30 NOTE — ED Notes (Signed)
Care transferred, report received from [place], [place]

## 2020-08-30 NOTE — ED Notes (Signed)
Called girlfriend Neoma Laming on phone to ask to come back to room. Pt attempting to climb OOB and requesting her. No answer, voicemail full.

## 2020-08-31 DIAGNOSIS — K7290 Hepatic failure, unspecified without coma: Secondary | ICD-10-CM | POA: Diagnosis not present

## 2020-08-31 LAB — COMPREHENSIVE METABOLIC PANEL
ALT: 33 U/L (ref 0–44)
AST: 84 U/L — ABNORMAL HIGH (ref 15–41)
Albumin: 2.5 g/dL — ABNORMAL LOW (ref 3.5–5.0)
Alkaline Phosphatase: 87 U/L (ref 38–126)
Anion gap: 8 (ref 5–15)
BUN: 12 mg/dL (ref 6–20)
CO2: 26 mmol/L (ref 22–32)
Calcium: 9.2 mg/dL (ref 8.9–10.3)
Chloride: 103 mmol/L (ref 98–111)
Creatinine, Ser: 0.96 mg/dL (ref 0.61–1.24)
GFR, Estimated: >60 mL/min (ref >60)
Glucose, Bld: 77 mg/dL (ref 70–99)
Potassium: 3.9 mmol/L (ref 3.5–5.1)
Sodium: 137 mmol/L (ref 135–145)
Total Bilirubin: 3.1 mg/dL — ABNORMAL HIGH (ref 0.3–1.2)
Total Protein: 6.8 g/dL (ref 6.5–8.1)

## 2020-08-31 LAB — CBC
HCT: 40.2 % (ref 39.0–52.0)
Hemoglobin: 13.8 g/dL (ref 13.0–17.0)
MCH: 32.9 pg (ref 26.0–34.0)
MCHC: 34.3 g/dL (ref 30.0–36.0)
MCV: 95.9 fL (ref 80.0–100.0)
Platelets: 64 10*3/uL — ABNORMAL LOW (ref 150–400)
RBC: 4.19 MIL/uL — ABNORMAL LOW (ref 4.22–5.81)
RDW: 16.3 % — ABNORMAL HIGH (ref 11.5–15.5)
WBC: 5.9 10*3/uL (ref 4.0–10.5)
nRBC: 0 % (ref 0.0–0.2)

## 2020-08-31 LAB — HIV ANTIBODY (ROUTINE TESTING W REFLEX): HIV Screen 4th Generation wRfx: NONREACTIVE

## 2020-08-31 LAB — PROTIME-INR
INR: 1.4 — ABNORMAL HIGH (ref 0.8–1.2)
Prothrombin Time: 17 seconds — ABNORMAL HIGH (ref 11.4–15.2)

## 2020-08-31 MED ORDER — LACTULOSE 10 GM/15ML PO SOLN: 20.0000 g | Freq: Three times a day (TID) | ORAL | 1 refills | Status: AC

## 2020-08-31 NOTE — ED Notes (Signed)
Lab at the bedside 

## 2020-08-31 NOTE — Discharge Summary (Signed)
Physician Discharge Summary  Jordan Hayden [id number] DOB: 1965/08/10 DOA: 08/30/2020  PCP: Mylo Body, MD  Admit date: 08/30/2020 Discharge date: 08/31/2020  Admitted From: home Disposition:  home  Recommendations for Outpatient Follow-up:  Follow up with PCP in 1-2 weeks Please obtain BMP/CBC in one week Please follow up on dose of lactulose which was increased from BID to TID  Home Health: No  Equipment/Devices: None   Discharge Condition: Stable  CODE STATUS: Full  Diet recommendation: Low sodium   Discharge Diagnoses: Principal Problem:   Hepatic encephalopathy ([place]) Active Problems:   Advanced cirrhosis of liver (HCC)   Thrombocytopenia ([place])    Summary of HPI and Hospital Course:  Per H&P by Dr. Posey Pronto:  "Jordan Hayden is a 55 y.o. 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  He was reportedly given a dose of lactulose by his significant other without improvement therefore EMS were called.  Patient was very agitated and combative in the ED and was given IM droperidol and Versed."  Subsequently IVC'd due to his AMS and attempting to leave.  ED evaluation consistent with hepatic encephalopathy with  ammonia 86.  CT head without contrast negative for acute intracranial abnormality.    Hepatic Encephalopathy Mental status returned to baseline once lactulose resumed and patient had multiple BM's. Lactulose frequency increased BID to TID.   Patient had reportedly missed a dose of lactulose which precipitated his encephalopathy.   IVC rescinded, no longer meets criteria.  Patient today is clinically improved, mental status at baseline, and stable for discharge home  with outpatient follow up.    Discharge Instructions   Discharge Instructions     Call MD for:  extreme fatigue   Complete by: As directed    Call MD for:  persistant dizziness or light-headedness   Complete by: As directed    Call MD for:  persistant nausea and vomiting   Complete by: As directed    Call MD for:  severe uncontrolled pain   Complete by: As directed    Call MD for:  temperature >100.4   Complete by: As directed    Diet - low sodium heart healthy   Complete by: As directed    Discharge instructions   Complete by: As directed    We increased the frequency of your lactulose from TWICE to THREE times daily.    It is okay to go back to TWICE daily as long as you're having at least 3 soft BM's every day.  Please be sure to take lactulose regularly, as missing doses quickly causes you to be very confused and agitated.   Increase activity slowly   Complete by: As directed       Allergies as of 08/31/2020       Reactions   Hydrocodone-acetaminophen Swelling   Mouth Swelling   Penicillins Swelling        Medication List     TAKE these medications    aspirin EC 81 MG tablet Take 81 mg by mouth daily.   ferrous sulfate 325 (65 FE) MG tablet Take 325 mg by mouth every morning.   furosemide 20 MG tablet Commonly known as: Lasix Take 1 tablet (20 mg total) by mouth daily.   hydrOXYzine 25 MG tablet Commonly known as: ATARAX/VISTARIL  Take 25 mg by mouth 3 (three) times daily as needed.   lactulose 10 GM/15ML solution Commonly known as: CHRONULAC Take 30 mLs (20 g total) by mouth 3 (three) times daily. What changed: when to take this   meloxicam 15 MG tablet Commonly known as: MOBIC Take 1 tablet (15 mg total) by mouth daily.   pantoprazole 40 MG tablet Commonly known as: Protonix Take 1 tablet (40 mg total) by mouth daily.   potassium chloride SA 20 MEQ tablet Commonly known as: KLOR-CON Take 2 tablets (40 mEq total) by mouth 2 (two) times  daily.   spironolactone 25 MG tablet Commonly known as: ALDACTONE Take 0.5 tablets (12.5 mg total) by mouth 2 (two) times daily.   Xifaxan 550 MG Tabs tablet Generic drug: rifaximin Take 550 mg by mouth 2 (two) times daily.        Allergies  Allergen Reactions   Hydrocodone-Acetaminophen Swelling    Mouth Swelling    Penicillins Swelling     If you experience worsening of your admission symptoms, develop shortness of breath, life threatening emergency, suicidal or homicidal thoughts you must seek medical attention immediately by calling 911 or calling your MD immediately  if symptoms less severe.    Please note   You were cared for by a hospitalist during your hospital stay. If you have any questions about your discharge medications or the care you received while you were in the hospital after you are discharged, you can call the unit and asked to speak with the hospitalist on call if the hospitalist that took care of you is not available. Once you are discharged, your primary care physician will handle any further medical issues. Please note that NO REFILLS for any discharge medications will be authorized once you are discharged, as it is imperative that you return to your primary care physician (or establish a relationship with a primary care physician if you do not have one) for your aftercare needs so that they can reassess your need for medications and monitor your lab values.   Consultations: none    Procedures/Studies: CT HEAD WO CONTRAST (5MM)  Result Date: 08/30/2020 CLINICAL DATA:  Altered mental status. EXAM: CT HEAD WITHOUT CONTRAST TECHNIQUE: Contiguous axial images were obtained from the base of the skull through the vertex without intravenous contrast. COMPARISON:  None. FINDINGS: Brain: No evidence of acute infarction, hemorrhage, hydrocephalus, extra-axial collection or mass lesion/mass effect. Scattered mild periventricular and subcortical white matter  hypodensities are nonspecific, but favored to reflect chronic microvascular ischemic changes. Vascular: Atherosclerotic vascular calcification of the carotid siphons. No hyperdense vessel. Skull: Normal. Negative for fracture or focal lesion. Sinuses/Orbits: No acute finding. Other: None. IMPRESSION: 1. No acute intracranial abnormality. Electronically Signed   By: Titus Dubin M.D.   On: 08/30/2020 18:52      Subjective: Pt seen in ED on hold for a bed.  He has had multiple BM's and mental status at baseline.  No acute complaints.  Stable and eager for d/c.     Discharge Exam: Vitals:   08/31/20 0800 08/31/20 1000  BP: 130/66 140/85  Pulse: 66 83  Resp: 20 20  Temp:    SpO2: 96% 95%   Vitals:   08/31/20 0600 08/31/20 0730 08/31/20 0800 08/31/20 1000  BP: (!) 142/76 134/75 130/66 140/85  Pulse: 62 73 66 83  Resp: '20 20 20 20  '$ Temp:      TempSrc:      SpO2: 98% 96% 96% 95%  Weight:      Height:        General: Pt is alert, awake, not in acute distress, morbidly obese Cardiovascular: RRR, S1/S2 +, no rubs, no gallops Respiratory: CTA bilaterally, no wheezing, no rhonchi Abdominal: Soft, NT, ND, bowel sounds + Extremities: moves all, normal tone Neurologic: A&Ox4, normal speech, no gross focal deficits     The results of significant diagnostics from this hospitalization (including imaging, microbiology, ancillary and laboratory) are listed below for reference.     Microbiology: Recent Results (from the past 240 hour(s))  Resp Panel by RT-PCR (Flu A&B, Covid) Nasopharyngeal Swab     Status: None   Collection Time: 08/30/20  6:45 PM   Specimen: Nasopharyngeal Swab; Nasopharyngeal(NP) swabs in vial transport medium  Result Value Ref Range Status   SARS Coronavirus 2 by RT PCR NEGATIVE NEGATIVE Final    Comment: (NOTE) SARS-CoV-2 target nucleic acids are NOT DETECTED.  The SARS-CoV-2 RNA is generally detectable in upper respiratory specimens during the acute phase of  infection. The lowest concentration of SARS-CoV-2 viral copies this assay can detect is 138 copies/mL. A negative result does not preclude SARS-Cov-2 infection and should not be used as the sole basis for treatment or other patient management decisions. A negative result may occur with  improper specimen collection/handling, submission of specimen other than nasopharyngeal swab, presence of viral mutation(s) within the areas targeted by this assay, and inadequate number of viral copies(<138 copies/mL). A negative result must be combined with clinical observations, patient history, and epidemiological information. The expected result is Negative.  Fact Sheet for Patients:  EntrepreneurPulse.com.au  Fact Sheet for Healthcare Providers:  IncredibleEmployment.be  This test is no t yet approved or cleared by the [place] FDA and  has been authorized for detection and/or diagnosis of SARS-CoV-2 by FDA under an Emergency Use Authorization (EUA). This EUA will remain  in effect (meaning this test can be used) for the duration of the COVID-19 declaration under Section 564(b)(1) of the Act, 21 U.S.C.section 360bbb-3(b)(1), unless the authorization is terminated  or revoked sooner.       Influenza A by PCR NEGATIVE NEGATIVE Final   Influenza B by PCR NEGATIVE NEGATIVE Final    Comment: (NOTE) The Xpert Xpress SARS-CoV-2/FLU/RSV plus assay is intended as an aid in the diagnosis of influenza from Nasopharyngeal swab specimens and should not be used as a sole basis for treatment. Nasal washings and aspirates are unacceptable for Xpert Xpress SARS-CoV-2/FLU/RSV testing.  Fact Sheet for Patients: EntrepreneurPulse.com.au  Fact Sheet for Healthcare Providers: IncredibleEmployment.be  This test is not yet approved or cleared by the [place] FDA and has been authorized for detection and/or diagnosis of SARS-CoV-2  by FDA under an Emergency Use Authorization (EUA). This EUA will remain in effect (meaning this test can be used) for the duration of the COVID-19 declaration under Section 564(b)(1) of the Act, 21 U.S.C. section 360bbb-3(b)(1), unless the authorization is terminated or revoked.  Performed at [facility], [address] [place], [place] [address]      Labs: BNP (last 3 results) No results for input(s): BNP in the last 8760 hours. Basic Metabolic Panel: Recent Labs  Lab 08/30/20 1507 08/31/20 0631  NA 136 137  K 4.0 3.9  CL 101 103  CO2 23 26  GLUCOSE 109* 77  BUN 11 12  CREATININE 1.14 0.96  CALCIUM 9.2 9.2   Liver Function Tests: Recent Labs  Lab 08/30/20 1507 08/31/20 0631  AST 76* 84*  ALT 34 33  ALKPHOS 100 87  BILITOT 2.5* 3.1*  PROT 7.8 6.8  ALBUMIN 2.8* 2.5*   No results for input(s): LIPASE, AMYLASE in the last 168 hours. Recent Labs  Lab 08/30/20 1448  AMMONIA 86*   CBC: Recent Labs  Lab 08/30/20 1507 08/31/20 0631  WBC 5.3 5.9  HGB 14.5 13.8  HCT 41.7 40.2  MCV 95.0 95.9  PLT 63* 64*   Cardiac Enzymes: No results for input(s): CKTOTAL, CKMB, CKMBINDEX, TROPONINI in the last 168 hours. BNP: Invalid input(s): POCBNP CBG: No results for input(s): GLUCAP in the last 168 hours. D-Dimer No results for input(s): DDIMER in the last 72 hours. Hgb A1c No results for input(s): HGBA1C in the last 72 hours. Lipid Profile No results for input(s): CHOL, HDL, LDLCALC, TRIG, CHOLHDL, LDLDIRECT in the last 72 hours. Thyroid function studies No results for input(s): TSH, T4TOTAL, T3FREE, THYROIDAB in the last 72 hours.  Invalid input(s): FREET3 Anemia work up No results for input(s): VITAMINB12, FOLATE, FERRITIN, TIBC, IRON, RETICCTPCT in the last 72 hours. Urinalysis    Component Value Date/Time   COLORURINE YELLOW (A) 08/30/2020 1428   APPEARANCEUR HAZY (A) 08/30/2020 1428   LABSPEC 1.016 08/30/2020 1428   PHURINE 5.0 08/30/2020  1428   GLUCOSEU NEGATIVE 08/30/2020 1428   HGBUR LARGE (A) 08/30/2020 1428   BILIRUBINUR NEGATIVE 08/30/2020 1428   KETONESUR NEGATIVE 08/30/2020 1428   PROTEINUR 30 (A) 08/30/2020 1428   NITRITE NEGATIVE 08/30/2020 1428   LEUKOCYTESUR NEGATIVE 08/30/2020 1428   Sepsis Labs Invalid input(s): PROCALCITONIN,  WBC,  LACTICIDVEN Microbiology Recent Results (from the past 240 hour(s))  Resp Panel by RT-PCR (Flu A&B, Covid) Nasopharyngeal Swab     Status: None   Collection Time: 08/30/20  6:45 PM   Specimen: Nasopharyngeal Swab; Nasopharyngeal(NP) swabs in vial transport medium  Result Value Ref Range Status   SARS Coronavirus 2 by RT PCR NEGATIVE NEGATIVE Final    Comment: (NOTE) SARS-CoV-2 target nucleic acids are NOT DETECTED.  The SARS-CoV-2 RNA is generally detectable in upper respiratory specimens during the acute phase of infection. The lowest concentration of SARS-CoV-2 viral copies this assay can detect is 138 copies/mL. A negative result does not preclude SARS-Cov-2 infection and should not be used as the sole basis for treatment or other patient management decisions. A negative result may occur with  improper specimen collection/handling, submission of specimen other than nasopharyngeal swab, presence of viral mutation(s) within the areas targeted by this assay, and inadequate number of viral copies(<138 copies/mL). A negative result must be combined with clinical observations, patient history, and epidemiological information. The expected result is Negative.  Fact Sheet for Patients:  EntrepreneurPulse.com.au  Fact Sheet for Healthcare Providers:  IncredibleEmployment.be  This test is no t yet approved or cleared by the [place] FDA and  has been authorized for detection and/or diagnosis of SARS-CoV-2 by FDA under an Emergency Use Authorization (EUA). This EUA will remain  in effect (meaning this test can be used) for the  duration of the COVID-19 declaration under Section 564(b)(1) of the Act, 21 U.S.C.section 360bbb-3(b)(1), unless the authorization is terminated  or revoked sooner.       Influenza A by PCR NEGATIVE NEGATIVE Final   Influenza B by PCR NEGATIVE NEGATIVE Final    Comment: (NOTE) The Xpert Xpress SARS-CoV-2/FLU/RSV plus assay is intended as an aid in the diagnosis of influenza from Nasopharyngeal swab specimens and should not be used as a sole basis for treatment. Nasal washings  and aspirates are unacceptable for Xpert Xpress SARS-CoV-2/FLU/RSV testing.  Fact Sheet for Patients: EntrepreneurPulse.com.au  Fact Sheet for Healthcare Providers: IncredibleEmployment.be  This test is not yet approved or cleared by the [place] FDA and has been authorized for detection and/or diagnosis of SARS-CoV-2 by FDA under an Emergency Use Authorization (EUA). This EUA will remain in effect (meaning this test can be used) for the duration of the COVID-19 declaration under Section 564(b)(1) of the Act, 21 U.S.C. section 360bbb-3(b)(1), unless the authorization is terminated or revoked.  Performed at [facility], [address] [place], [place] [address]      Time coordinating discharge: Over 30 minutes  SIGNED:   Ezekiel Slocumb, DO [facility] 08/31/2020, 12:18 PM   If 7PM-7AM, please contact night-coverage [email or web address]

## 2020-09-11 IMAGING — US US ABDOMEN COMPLETE
1 series · 14 of 25 positions shown · non-contrast
Comparison: 09/24/2018

CLINICAL DATA: Alcoholic cirrhosis without ascites

EXAM:
ABDOMEN ULTRASOUND COMPLETE

[Series 1: us abdomen complete · 0.19mm/px · 14 of 83 slices shown]
[im 1/83]
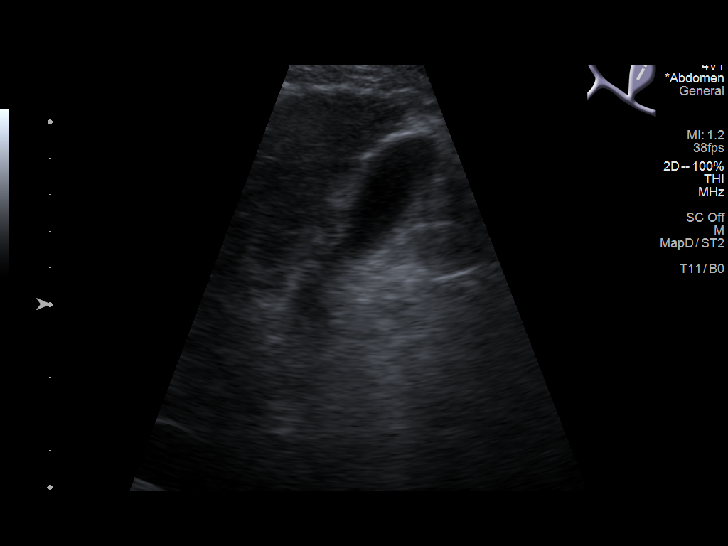
[im 7/83]
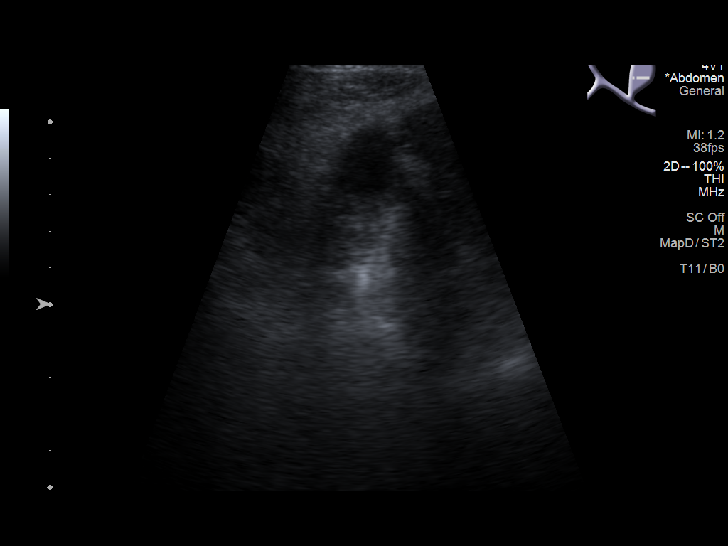
[im 14/83]
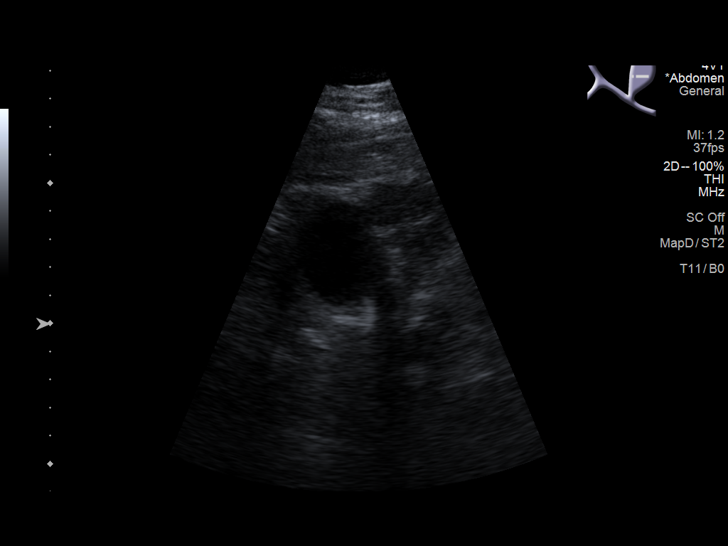
[im 21/83]
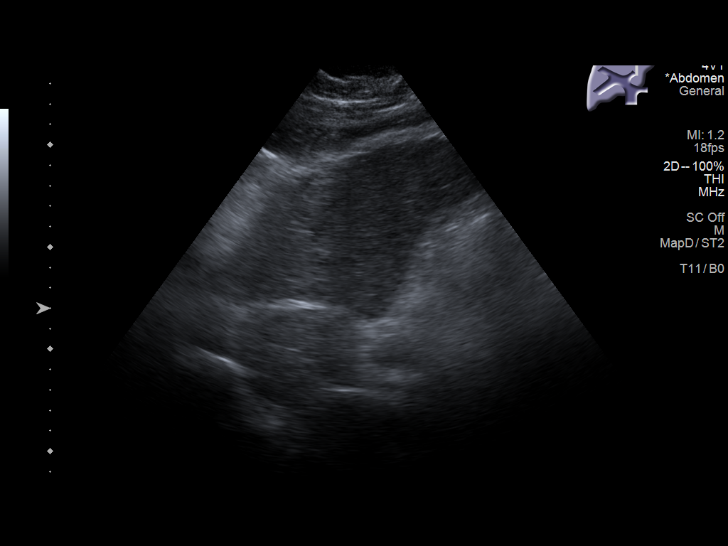
[im 28/83]
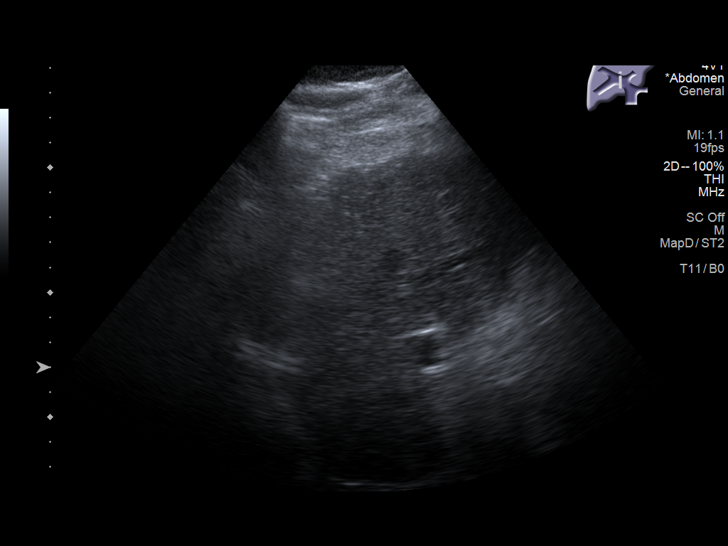
[im 31/83]
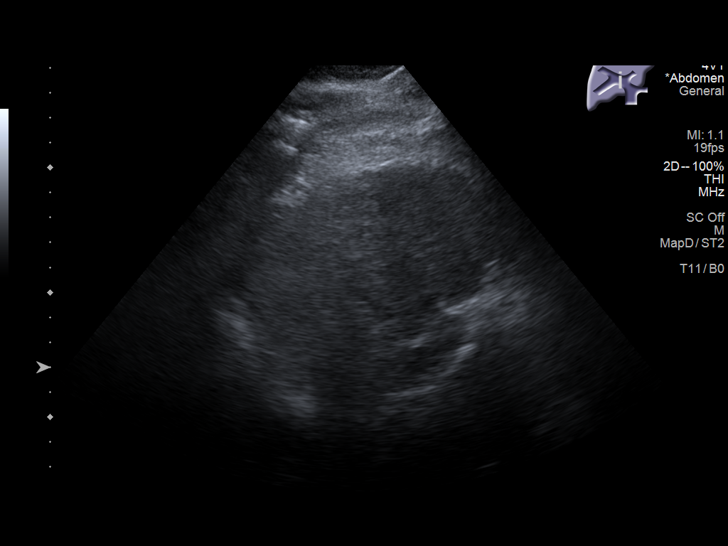
[im 38/83]
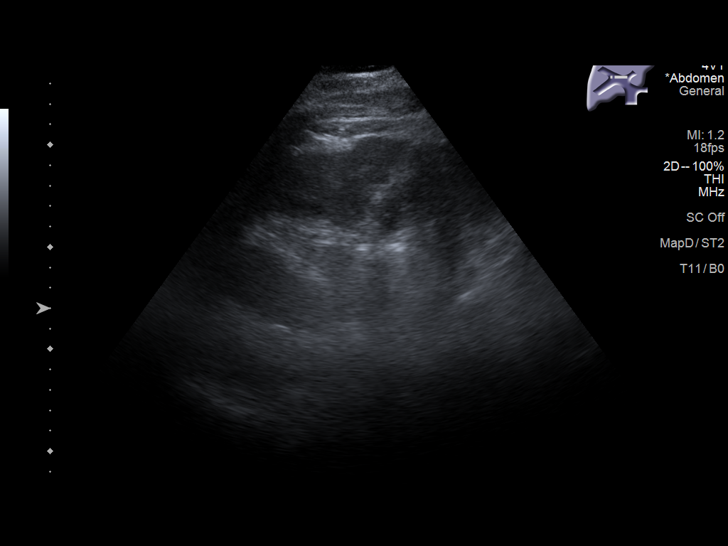
[im 45/83]
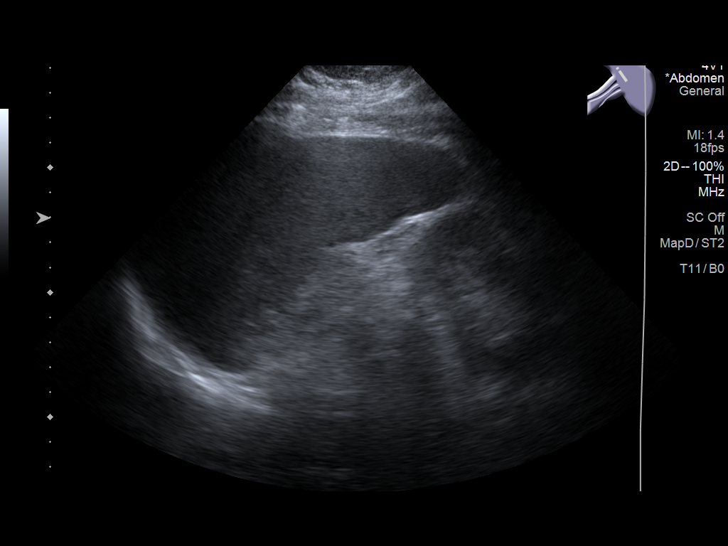
[im 52/83]
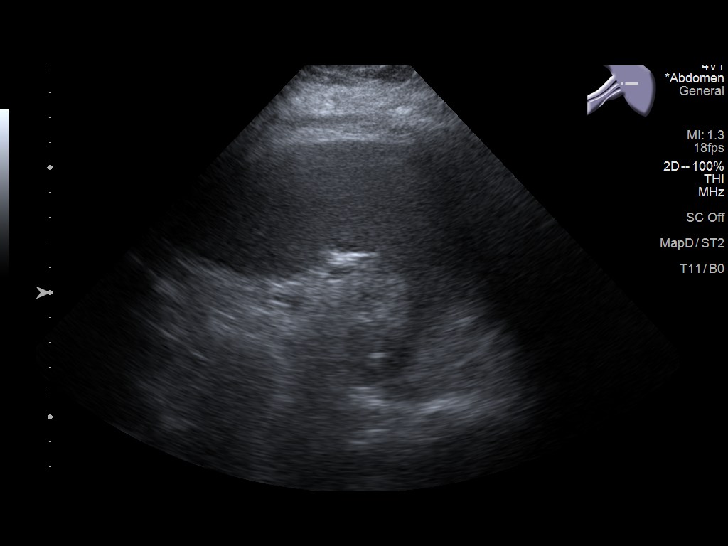
[im 55/83]
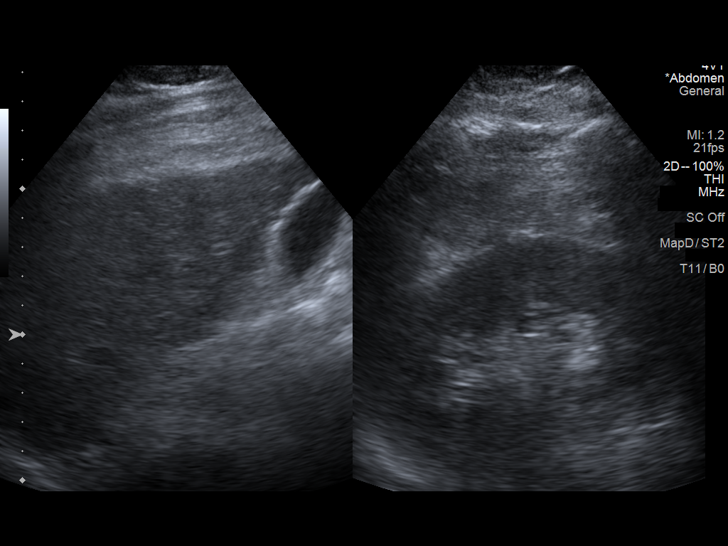
[im 62/83]
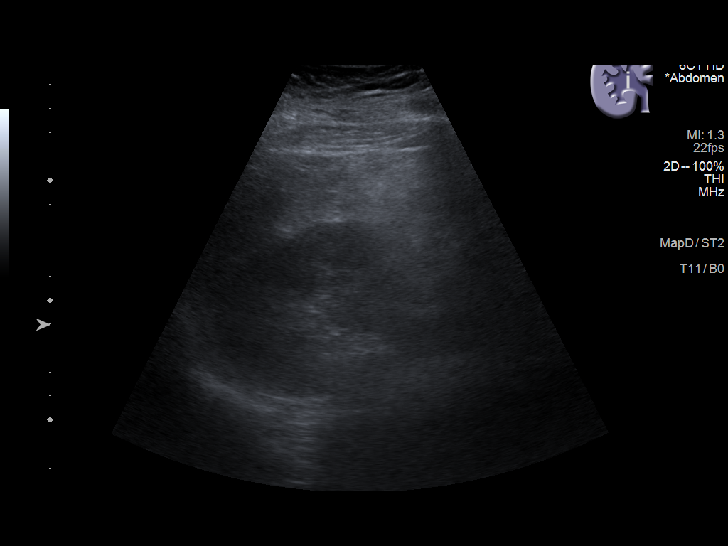
[im 69/83]
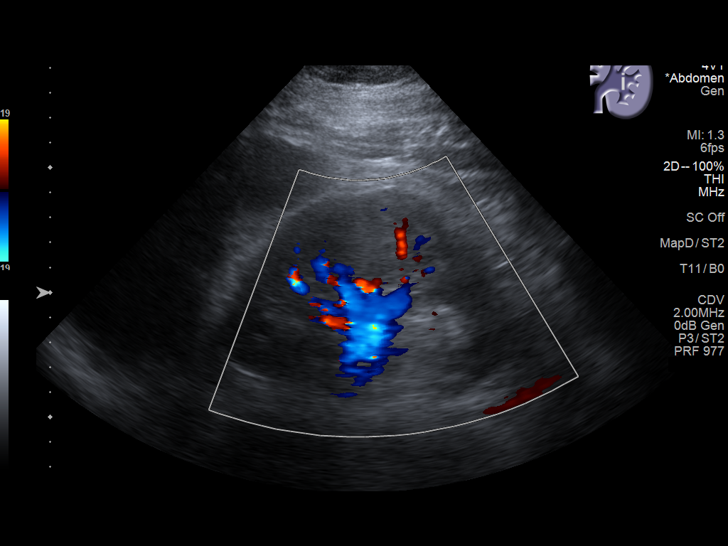
[im 76/83]
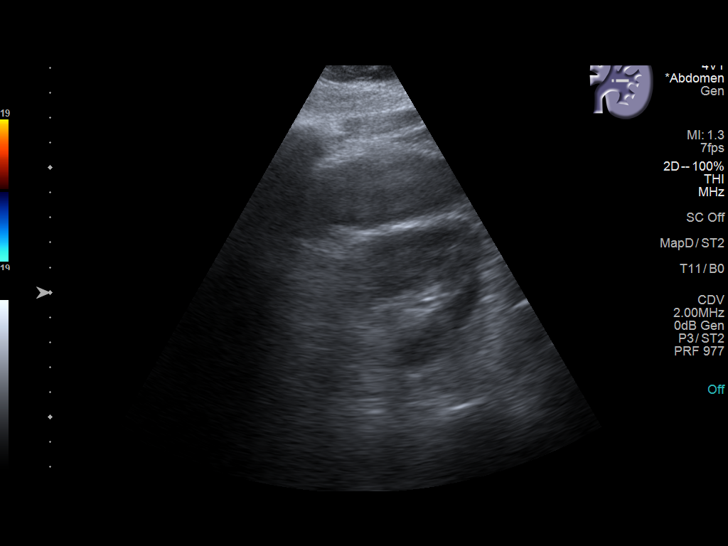
[im 83/83]
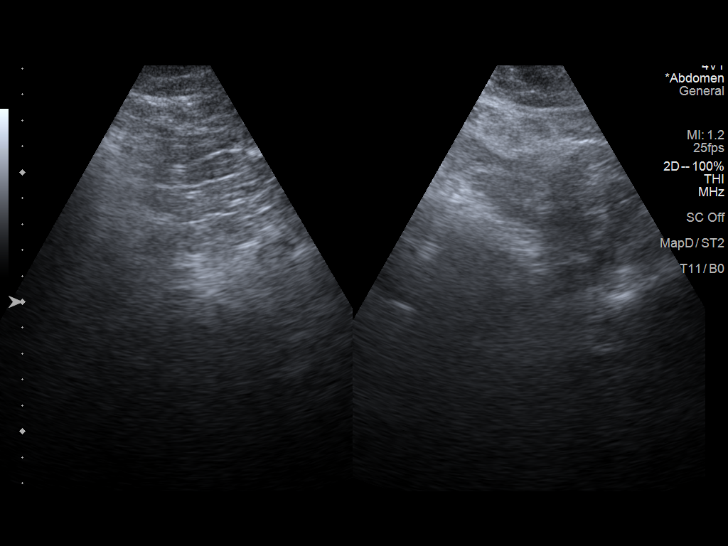

[14 of 25 positions shown; findings below may reference images not displayed]

FINDINGS: Gallbladder: Normally distended without stones or wall thickening.
No pericholecystic fluid or sonographic Murphy sign.

Common bile duct: Diameter: 4 mm

Liver: Small nodular heterogeneous liver with irregular margins
consistent with cirrhosis. No discrete hepatic mass identified.
Portal vein is patent on color Doppler imaging with normal direction
of blood flow towards the liver.

IVC: Unremarkable

Pancreas: Normal appearance

Spleen: Enlarged at 13.5 cm length with a calculated volume of 798
mL.

Right Kidney: Length: 11.3 cm. Normal morphology without mass or
hydronephrosis.

Left Kidney: Length: 12.3 cm. Normal morphology without mass or
hydronephrosis.

Abdominal aorta: Poorly visualized due to bowel gas and depth.

Other findings: No free fluid
IMPRESSION: Nodular cirrhotic liver without mass.

Splenomegaly.

## 2020-12-21 ENCOUNTER — Other Ambulatory Visit: Payer: Self-pay | Admitting: Gastroenterology

## 2020-12-21 DIAGNOSIS — K7030 Alcoholic cirrhosis of liver without ascites: Secondary | ICD-10-CM

## 2020-12-21 DIAGNOSIS — K7460 Unspecified cirrhosis of liver: Secondary | ICD-10-CM

## 2020-12-21 DIAGNOSIS — D696 Thrombocytopenia, unspecified: Secondary | ICD-10-CM

## 2020-12-21 DIAGNOSIS — K7682 Hepatic encephalopathy: Secondary | ICD-10-CM

## 2020-12-21 DIAGNOSIS — I8510 Secondary esophageal varices without bleeding: Secondary | ICD-10-CM

## 2021-02-24 ENCOUNTER — Encounter: Payer: Self-pay | Admitting: Oncology

## 2021-02-24 ENCOUNTER — Other Ambulatory Visit: Payer: Self-pay | Admitting: Gastroenterology

## 2021-02-24 DIAGNOSIS — K7460 Unspecified cirrhosis of liver: Secondary | ICD-10-CM

## 2021-02-24 DIAGNOSIS — D696 Thrombocytopenia, unspecified: Secondary | ICD-10-CM

## 2021-02-24 DIAGNOSIS — K7030 Alcoholic cirrhosis of liver without ascites: Secondary | ICD-10-CM

## 2021-03-01 ENCOUNTER — Other Ambulatory Visit: Payer: Self-pay

## 2021-03-01 ENCOUNTER — Ambulatory Visit
Admission: RE | Admit: 2021-03-01 | Discharge: 2021-03-01 | Disposition: A | Discharge status: Home / Self Care | Payer: Medicare Other | Source: Ambulatory Visit | Attending: Gastroenterology | Admitting: Gastroenterology

## 2021-03-01 DIAGNOSIS — K7460 Unspecified cirrhosis of liver: Secondary | ICD-10-CM | POA: Diagnosis not present

## 2021-03-01 DIAGNOSIS — D696 Thrombocytopenia, unspecified: Secondary | ICD-10-CM | POA: Diagnosis present

## 2021-03-01 DIAGNOSIS — K7030 Alcoholic cirrhosis of liver without ascites: Secondary | ICD-10-CM | POA: Diagnosis present

## 2021-03-01 DIAGNOSIS — I8510 Secondary esophageal varices without bleeding: Secondary | ICD-10-CM | POA: Insufficient documentation

## 2021-04-29 ENCOUNTER — Other Ambulatory Visit: Payer: Self-pay | Admitting: Adult Health Nurse Practitioner

## 2021-04-29 ENCOUNTER — Other Ambulatory Visit (HOSPITAL_COMMUNITY): Payer: Self-pay | Admitting: Adult Health Nurse Practitioner

## 2021-04-29 DIAGNOSIS — K7460 Unspecified cirrhosis of liver: Secondary | ICD-10-CM

## 2021-07-25 ENCOUNTER — Ambulatory Visit (INDEPENDENT_AMBULATORY_CARE_PROVIDER_SITE_OTHER): Payer: Medicare Other | Admitting: Pulmonary Disease

## 2021-07-25 ENCOUNTER — Encounter: Payer: Self-pay | Admitting: Pulmonary Disease

## 2021-07-25 VITALS — BP 120/74 | HR 58 | Temp 98.5°F | Ht 68.0 in | Wt 336.0 lb

## 2021-07-25 DIAGNOSIS — R0602 Shortness of breath: Secondary | ICD-10-CM

## 2021-07-25 NOTE — Progress Notes (Signed)
Subjective:   PATIENT ID: Jordan Hayden GENDER: male DOB: 1965/10/10, MRN: [id number]   HPI  Chief Complaint  Patient presents with   Consult    Letter for DMV.    Reason for Visit: New consult for evaluation regarding ignition interlock device  [name] is a 56 year old male never smoker with morbid obesity, history of alcoholic cirrhosis, esophageal varices, hx hepatic encephalopathy, IDA who presents as a new consult for DMV paperwork. Present with girlfriend  He is requesting evaluation for ignition interlock medical accomodation evaluation. He was seen by his primary pulmonologist at [facility] on 05/04/21. Note reviewed. He has a history of multiple DWI and is currently required to perform a breath test before driving. He has had shallow breathing due to abdominal restriction from central adiposity. PFTs performed in April consistent with "Moderately severe reduction in spirometry and severe reduction in ERV due to morbid obesity" per his primary pulmonologist. At the last visit, he was advised for home sleep study which has not been completed.  He denies shortness of breath, wheezing or cough however states he is sedentary at baseline. However he states that when previously using his interlock device, he was unable to provide sufficient effort to start his car. He is seeking evaluation with additional pulmonologist per [facility] requirements.  Social History: Never smoker Former alcohol use with last drink in 2017 per patient  I have personally reviewed patient's past medical/family/social history, allergies, current medications.  Past Medical History:  Diagnosis Date   Alcohol abuse    Alcoholic cirrhosis ([place])    Alcoholism ([place])    Arthritis    Cirrhosis ([place])      Family History  Problem Relation Age of Onset   Hypertension Mother    Diabetes Father    Cervical cancer Sister    Cirrhosis Brother      Social History   Occupational History   Not on file   Tobacco Use   Smoking status: Never   Smokeless tobacco: Never  Vaping Use   Vaping Use: Never used  Substance and Sexual Activity   Alcohol use: No    Comment: quit 2 years    Drug use: No   Sexual activity: Not on file    Allergies  Allergen Reactions   Hydrocodone-Acetaminophen Swelling    Mouth Swelling    Penicillins Swelling     Outpatient Medications Prior to Visit  Medication Sig Dispense Refill   albuterol (VENTOLIN HFA) 108 (90 Base) MCG/ACT inhaler Inhale into the lungs.     doxycycline (VIBRAMYCIN) 100 MG capsule Take 100 mg by mouth 2 (two) times daily.     ferrous sulfate 325 (65 FE) MG tablet Take 325 mg by mouth every morning.     hydrOXYzine (ATARAX/VISTARIL) 25 MG tablet Take 25 mg by mouth 3 (three) times daily as needed.     lactulose (CHRONULAC) 10 GM/15ML solution Take 30 mLs (20 g total) by mouth 3 (three) times daily. 236 mL 1   meloxicam (MOBIC) 15 MG tablet Take 1 tablet (15 mg total) by mouth daily. 30 tablet 0   potassium chloride SA (K-DUR,KLOR-CON) 20 MEQ tablet Take 2 tablets (40 mEq total) by mouth 2 (two) times daily. 60 tablet 0   XIFAXAN 550 MG TABS tablet Take 550 mg by mouth 2 (two) times daily.     aspirin EC 81 MG tablet Take 81 mg by mouth daily. (Patient not taking: Reported on 07/25/2021)     furosemide (  LASIX) 20 MG tablet Take 1 tablet (20 mg total) by mouth daily. 60 tablet 0   pantoprazole (PROTONIX) 40 MG tablet Take 1 tablet (40 mg total) by mouth daily. 180 tablet 0   spironolactone (ALDACTONE) 25 MG tablet Take 0.5 tablets (12.5 mg total) by mouth 2 (two) times daily. (Patient not taking: Reported on 07/25/2021) 60 tablet 0   No facility-administered medications prior to visit.    Review of Systems  Constitutional:  Negative for chills, diaphoresis, fever, malaise/fatigue and weight loss.  HENT:  Negative for congestion.   Respiratory:  Negative for cough, hemoptysis, sputum production, shortness of breath and wheezing.    Cardiovascular:  Negative for chest pain, palpitations and leg swelling.     Objective:   Vitals:   07/25/21 1417  BP: 120/74  Pulse: (!) 58  Temp: 98.5 F (36.9 C)  TempSrc: Oral  SpO2: 98%  Weight: (!) 336 lb (152.4 kg)  Height: '5\' 8"'$  (1.727 m)   SpO2: 98 % Body mass index is 51.09 kg/m.  Physical Exam: General: Morbidly obese, no acute distress HENT: [place], AT Eyes: EOMI, no scleral icterus Respiratory: Clear to auscultation bilaterally.  No crackles, wheezing or rales Cardiovascular: RRR, -M/R/G, no JVD Extremities:-Edema,-tenderness Neuro: AAO x4, CNII-XII grossly intact Psych: Normal mood, normal affect   Data Reviewed:  Imaging: CT Chest 03/02/16 - Elevated right hemidiaphragm, right basilar atelectasis, left pleural effusion and LLL collapse CXR 07/22/18 - Mild right hemidiaphragm elevation  PFT: [facility] DATA:  -01/20/20- FEV1- 1.78 - 58.5% -02/02/20- FEV1- 2.56L - 97% 02/02/21- FEV 1 - 2.65l - 88%  05/04/21 [facility]ETRY: FVC was 2.85 Liters, 63% of predicted FEV1 was 2.33 Liters, 65% of predicted FEV1 ratio was 81.62%, 104% of predicted FEF 25-75% Liters per second was 2.74, 86% of predicted  DIFFUSION CAPACITY: DLCO was 30.99 mL/min/mmHg VA was 5.68 Liters DLCO/VA was 5.46 mL/min/mmHg/L  LUNG VOLUMES: TLC was 3.81 Liters RV was 0.90 Liters  Moderately severe reduction in spirometry and severe reduction in ERV due to morbid obesity   07/25/21 Spirometry Patient effort: Poor. 7 attempts with 1 acceptable reading. FVC 3.3 (87%) FEV1 2.7 (91%) Ratio 82% Interpretation: Poor effort. Normal spirometry on single read.  Labs: CBC    Component Value Date/Time   WBC 5.9 08/31/2020 0631   RBC 4.19 (L) 08/31/2020 0631   HGB 13.8 08/31/2020 0631   HCT 40.2 08/31/2020 0631   PLT 64 (L) 08/31/2020 0631   MCV 95.9 08/31/2020 0631   MCH 32.9 08/31/2020 0631   MCHC 34.3 08/31/2020 0631   RDW 16.3 (H) 08/31/2020 0631   LYMPHSABS 2.7  09/30/2018 1340   MONOABS 0.6 09/30/2018 1340   EOSABS 0.3 09/30/2018 1340   BASOSABS 0.1 09/30/2018 1340        Assessment & Plan:   Discussion: 56 year old male never smoker with morbid obesity, history of alcoholic cirrhosis, esophageal varices, hx hepatic encephalopathy, IDA who presents as a new consult for DMV paperwork.  Consulted for opinion regarding lung function for interlock device.  Spirometry reviewed with normal values on single trial that required a total of 7 attempts. On my personal exam poor effort on spirometry despite coaching. This exam combined with prior spirometry suggests poor effort secondary to restrictive defect related to his morbid obesity. DMV paperwork filled and scanned.  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  Suspect OSA However patient denies daytime fatigue or excessive daytime sleepiness Declined sleep study recommended by his primary pulmonologist Encouraged him to reconsider testing  Health Maintenance There is no immunization history for the selected administration types on file for this patient. CT Lung Screen - not qualified. Never smoker  Orders Placed This Encounter  Procedures   Spirometry with graph    Order Specific Question:   Where should this test be performed?    Answer:   [facility]  No orders of the defined types were placed in this encounter.   Return if symptoms worsen or fail to improve.  I have spent a total time of 50-minutes on the day of the appointment reviewing prior documentation, coordinating care and discussing medical diagnosis and plan with the patient/family. Imaging, labs and tests included in this note have been reviewed and interpreted independently by me.  [place], MD [facility] 07/25/2021 2:24 PM  Office Number [phone]

## 2021-07-25 NOTE — Patient Instructions (Signed)
Consulted for opinion regarding lung function for interlock device.  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  Follow-up with me as needed

## 2021-08-03 ENCOUNTER — Other Ambulatory Visit: Payer: Self-pay | Admitting: Physician Assistant

## 2021-08-03 ENCOUNTER — Other Ambulatory Visit (HOSPITAL_COMMUNITY): Payer: Self-pay | Admitting: Physician Assistant

## 2021-08-03 ENCOUNTER — Ambulatory Visit
Admission: RE | Admit: 2021-08-03 | Discharge: 2021-08-03 | Disposition: A | Discharge status: Home / Self Care | Payer: Medicare Other | Source: Ambulatory Visit | Attending: Physician Assistant | Admitting: Physician Assistant

## 2021-08-03 DIAGNOSIS — R590 Localized enlarged lymph nodes: Secondary | ICD-10-CM

## 2021-08-03 MED ORDER — IOHEXOL 300 MG/ML  SOLN: 75.0000 mL | Freq: Once | INTRAMUSCULAR | Status: DC | PRN

## 2021-08-08 ENCOUNTER — Encounter: Payer: Self-pay | Admitting: Oncology

## 2021-08-08 ENCOUNTER — Inpatient Hospital Stay: Payer: Medicare Other | Attending: Oncology | Admitting: Oncology

## 2021-08-08 ENCOUNTER — Inpatient Hospital Stay: Payer: Medicare Other

## 2021-08-08 VITALS — BP 123/71 | HR 50 | Temp 98.8°F | Ht 68.0 in | Wt 341.0 lb

## 2021-08-08 DIAGNOSIS — D696 Thrombocytopenia, unspecified: Secondary | ICD-10-CM

## 2021-08-08 LAB — CBC WITH DIFFERENTIAL/PLATELET
Abs Immature Granulocytes: 0.01 10*3/uL (ref 0.00–0.07)
Basophils Absolute: 0 10*3/uL (ref 0.0–0.1)
Basophils Relative: 1 %
Eosinophils Absolute: 0.1 10*3/uL (ref 0.0–0.5)
Eosinophils Relative: 4 %
HCT: 39.6 % (ref 39.0–52.0)
Hemoglobin: 13.6 g/dL (ref 13.0–17.0)
Immature Granulocytes: 0 %
Lymphocytes Relative: 48 %
Lymphs Abs: 1.6 10*3/uL (ref 0.7–4.0)
MCH: 33.8 pg (ref 26.0–34.0)
MCHC: 34.3 g/dL (ref 30.0–36.0)
MCV: 98.5 fL (ref 80.0–100.0)
Monocytes Absolute: 0.3 10*3/uL (ref 0.1–1.0)
Monocytes Relative: 8 %
Neutro Abs: 1.3 10*3/uL — ABNORMAL LOW (ref 1.7–7.7)
Neutrophils Relative %: 39 %
Platelets: 40 10*3/uL — ABNORMAL LOW (ref 150–400)
RBC: 4.02 MIL/uL — ABNORMAL LOW (ref 4.22–5.81)
RDW: 15.8 % — ABNORMAL HIGH (ref 11.5–15.5)
WBC: 3.4 10*3/uL — ABNORMAL LOW (ref 4.0–10.5)
nRBC: 0 % (ref 0.0–0.2)

## 2021-08-08 LAB — COMPREHENSIVE METABOLIC PANEL
ALT: 30 U/L (ref 0–44)
AST: 63 U/L — ABNORMAL HIGH (ref 15–41)
Albumin: 2.4 g/dL — ABNORMAL LOW (ref 3.5–5.0)
Alkaline Phosphatase: 140 U/L — ABNORMAL HIGH (ref 38–126)
Anion gap: 3 — ABNORMAL LOW (ref 5–15)
BUN: 10 mg/dL (ref 6–20)
CO2: 30 mmol/L (ref 22–32)
Calcium: 9.1 mg/dL (ref 8.9–10.3)
Chloride: 104 mmol/L (ref 98–111)
Creatinine, Ser: 0.99 mg/dL (ref 0.61–1.24)
GFR, Estimated: >60 mL/min (ref >60)
Glucose, Bld: 93 mg/dL (ref 70–99)
Potassium: 4.4 mmol/L (ref 3.5–5.1)
Sodium: 137 mmol/L (ref 135–145)
Total Bilirubin: 1.8 mg/dL — ABNORMAL HIGH (ref 0.3–1.2)
Total Protein: 5.8 g/dL — ABNORMAL LOW (ref 6.5–8.1)

## 2021-08-08 LAB — RETIC PANEL
Immature Retic Fract: 10.6 % (ref 2.3–15.9)
RBC.: 4.07 MIL/uL — ABNORMAL LOW (ref 4.22–5.81)
Retic Count, Absolute: 62.3 10*3/uL (ref 19.0–186.0)
Retic Ct Pct: 1.5 % (ref 0.4–3.1)
Reticulocyte Hemoglobin: 37.1 pg (ref >27.9)

## 2021-08-08 LAB — IMMATURE PLATELET FRACTION: Immature Platelet Fraction: 12.4 % — ABNORMAL HIGH (ref 1.2–8.6)

## 2021-08-08 LAB — LACTATE DEHYDROGENASE: LDH: 170 U/L (ref 98–192)

## 2021-08-08 LAB — FOLATE: Folate: 15.2 ng/mL (ref >5.9)

## 2021-08-08 LAB — VITAMIN B12: Vitamin B-12: 1144 pg/mL — ABNORMAL HIGH (ref 180–914)

## 2021-08-08 NOTE — Progress Notes (Signed)
Hematology/Oncology Consult Note [phone] [phone]    Patient Care Team: Jobin Body, MD as PCP - General (Family Medicine) Tsosie Billing, MD as Consulting Physician (Infectious Diseases)  REFERRING PROVIDER: Marin Body, MD  CHIEF COMPLAINTS/REASON FOR VISIT:  Evaluation of thrombocytopenia  HISTORY OF PRESENTING ILLNESS:  Jordan Hayden is a 56 y.o. male who was seen in consultation at the request of Aleck Body, MD for evaluation of    07/31/21 Patient had abnormal Labs which showed decreased platelet counts at  39,000 Reviewed patient's previous labs. Thrombocytopenia is chronic chronic onset , since at least 2018  Denies weight loss, fever, chills, fatigue, night sweats.  Denies hematochezia, hematuria, hematemesis, epistaxis, black tarry stool.  + easy bruising. No acute bleeding.  + right side wisdom tooth infection, he has taken 2 rounds of antibiotics. He noticed right cervical lymphadenopthy for 3 weeks.  + history of liver cirrhosis. Former alcohol user.  He was seen by me in 2019 and lost follow up.  There is plan of oral surgery in September 2023.     MEDICAL HISTORY:  Past Medical History:  Diagnosis Date   Alcohol abuse    Alcoholic cirrhosis ([place])    Alcoholism ([place])    Arthritis    Cirrhosis ([place])     SURGICAL HISTORY: Past Surgical History:  Procedure Laterality Date   ESOPHAGOGASTRODUODENOSCOPY (EGD) WITH PROPOFOL N/A 07/01/2017   Procedure: ESOPHAGOGASTRODUODENOSCOPY (EGD) WITH PROPOFOL;  Surgeon: Lucilla Lame, MD;  Location: [facility] ENDOSCOPY;  Service: Endoscopy;  Laterality: N/A;    SOCIAL HISTORY: Social History   Socioeconomic History   Marital status: Single    Spouse name: Not on file   Number of children: 1   Years of education: Not on file   Highest education level: Not on file  Occupational History   Not on file  Tobacco Use   Smoking status: Never   Smokeless tobacco:  Never  Vaping Use   Vaping Use: Never used  Substance and Sexual Activity   Alcohol use: No    Comment: quit 2 years    Drug use: No   Sexual activity: Not on file  Other Topics Concern   Not on file  Social History Narrative   Not on file   Social Determinants of Health   Financial Resource Strain: Not on file  Food Insecurity: Not on file  Transportation Needs: Not on file  Physical Activity: Not on file  Stress: Not on file  Social Connections: Not on file  Intimate Partner Violence: Not on file    FAMILY HISTORY: Family History  Problem Relation Age of Onset   Hypertension Mother    Diabetes Father    Cervical cancer Sister    Cirrhosis Brother     ALLERGIES:  is allergic to hydrocodone-acetaminophen and penicillins.  MEDICATIONS:  Current Outpatient Medications  Medication Sig Dispense Refill   albuterol (VENTOLIN HFA) 108 (90 Base) MCG/ACT inhaler Inhale into the lungs.     doxycycline (VIBRAMYCIN) 100 MG capsule Take 100 mg by mouth 2 (two) times daily.     ferrous sulfate 325 (65 FE) MG tablet Take 325 mg by mouth every morning.     hydrOXYzine (ATARAX/VISTARIL) 25 MG tablet Take 25 mg by mouth 3 (three) times daily as needed.     lactulose (CHRONULAC) 10 GM/15ML solution Take 30 mLs (20 g total) by mouth 3 (three) times daily. 236 mL 1   meloxicam (MOBIC) 15 MG tablet Take 1 tablet (15 mg total) by  mouth daily. 30 tablet 0   potassium chloride SA (K-DUR,KLOR-CON) 20 MEQ tablet Take 2 tablets (40 mEq total) by mouth 2 (two) times daily. 60 tablet 0   XIFAXAN 550 MG TABS tablet Take 550 mg by mouth 2 (two) times daily.     aspirin EC 81 MG tablet Take 81 mg by mouth daily. (Patient not taking: Reported on 07/25/2021)     furosemide (LASIX) 20 MG tablet Take 1 tablet (20 mg total) by mouth daily. 60 tablet 0   pantoprazole (PROTONIX) 40 MG tablet Take 1 tablet (40 mg total) by mouth daily. 180 tablet 0   spironolactone (ALDACTONE) 25 MG tablet Take 0.5 tablets  (12.5 mg total) by mouth 2 (two) times daily. (Patient not taking: Reported on 07/25/2021) 60 tablet 0   No current facility-administered medications for this visit.    Review of Systems  Constitutional:  Positive for fatigue. Negative for appetite change, chills, fever and unexpected weight change.  HENT:   Negative for hearing loss and voice change.        Wisdom tooth pain  Eyes:  Negative for eye problems and icterus.  Respiratory:  Negative for chest tightness, cough and shortness of breath.   Cardiovascular:  Negative for chest pain and leg swelling.  Gastrointestinal:  Negative for abdominal distention and abdominal pain.  Endocrine: Negative for hot flashes.  Genitourinary:  Negative for difficulty urinating, dysuria and frequency.   Musculoskeletal:  Negative for arthralgias.  Skin:  Negative for itching and rash.  Neurological:  Negative for light-headedness and numbness.  Hematological:  Positive for adenopathy. Bruises/bleeds easily.  Psychiatric/Behavioral:  Negative for confusion.     PHYSICAL EXAMINATION: ECOG PERFORMANCE STATUS: 1 - Symptomatic but completely ambulatory Vitals:   08/08/21 1458  BP: 123/71  Pulse: (!) 50  Temp: 98.8 F (37.1 C)   Filed Weights   08/08/21 1458  Weight: (!) 341 lb (154.7 kg)    Physical Exam Constitutional:      General: He is not in acute distress. HENT:     Head: Normocephalic and atraumatic.  Eyes:     General: No scleral icterus. Neck:     Comments: Right side cervical lymphadenopathy - 2cm mobile flat lymph node with a few small lymph nodes.  Cardiovascular:     Rate and Rhythm: Normal rate and regular rhythm.     Heart sounds: Normal heart sounds.  Pulmonary:     Effort: Pulmonary effort is normal. No respiratory distress.     Breath sounds: No wheezing.  Abdominal:     General: Bowel sounds are normal. There is no distension.     Palpations: Abdomen is soft.  Musculoskeletal:        General: No deformity.  Normal range of motion.     Cervical back: Normal range of motion and neck supple.  Lymphadenopathy:     Cervical: Cervical adenopathy present.  Skin:    General: Skin is warm and dry.     Findings: No erythema or rash.  Neurological:     Mental Status: He is alert and oriented to person, place, and time. Mental status is at baseline.     Cranial Nerves: No cranial nerve deficit.     Coordination: Coordination normal.  Psychiatric:        Mood and Affect: Mood normal.      LABORATORY DATA:  I have reviewed the data as listed    Latest Ref Rng & Units 08/08/2021    3:43 PM  08/31/2020    6:31 AM 08/30/2020    3:07 PM  CBC  WBC 4.0 - 10.5 K/uL 3.4  5.9  5.3   Hemoglobin 13.0 - 17.0 g/dL 13.6  13.8  14.5   Hematocrit 39.0 - 52.0 % 39.6  40.2  41.7   Platelets 150 - 400 K/uL 40  64  63       Latest Ref Rng & Units 08/08/2021    3:43 PM 08/31/2020    6:31 AM 08/30/2020    3:07 PM  CMP  Glucose 70 - 99 mg/dL 93  77  109   BUN 6 - 20 mg/dL 10  12  11    Creatinine 0.61 - 1.24 mg/dL 0.99  0.96  1.14   Sodium 135 - 145 mmol/L 137  137  136   Potassium 3.5 - 5.1 mmol/L 4.4  3.9  4.0   Chloride 98 - 111 mmol/L 104  103  101   CO2 22 - 32 mmol/L 30  26  23    Calcium 8.9 - 10.3 mg/dL 9.1  9.2  9.2   Total Protein 6.5 - 8.1 g/dL 5.8  6.8  7.8   Total Bilirubin 0.3 - 1.2 mg/dL 1.8  3.1  2.5   Alkaline Phos 38 - 126 U/L 140  87  100   AST 15 - 41 U/L 63  84  76   ALT 0 - 44 U/L 30  33  34      RADIOGRAPHIC STUDIES: I have personally reviewed the radiological images as listed and agreed with the findings in the report.  No results found.   ASSESSMENT & PLAN:   Thrombocytopenia ([place]) Likely due to chronic liver disease/splenomegaly.  Rule out other etiologies.  For the work up of patient's thrombocytopenia, I recommend checking CBC, immature platelet fraction, LDH; smear review, folate, Vitamin B12, hepatitis, HIV, flowcytometry and monoclonal gammopathy workup.   His platelet  count is less then 50,000, recommend platelet transfusion or TPO prior to invasive procedures. Advise patient to notify me once he knows oral surgery date.      # Patient follow-up with me in approximately 3-4 weeks to review the above results.   Orders Placed This Encounter  Procedures   CBC with Differential/Platelet    Standing Status:   Future    Number of Occurrences:   1    Standing Expiration Date:   08/09/2022   Comprehensive metabolic panel    Standing Status:   Future    Number of Occurrences:   1    Standing Expiration Date:   08/09/2022   Multiple Myeloma Panel (SPEP&IFE w/QIG)    Standing Status:   Future    Number of Occurrences:   1    Standing Expiration Date:   08/09/2022   Kappa/lambda light chains    Standing Status:   Future    Number of Occurrences:   1    Standing Expiration Date:   08/09/2022   Retic Panel    Standing Status:   Future    Number of Occurrences:   1    Standing Expiration Date:   08/09/2022   Flow cytometry panel-leukemia/lymphoma work-up    Standing Status:   Future    Number of Occurrences:   1    Standing Expiration Date:   08/09/2022   Immature Platelet Fraction    Standing Status:   Future    Number of Occurrences:   1    Standing Expiration Date:  08/09/2022   HIV Antibody (routine testing w rflx)    Standing Status:   Future    Number of Occurrences:   1    Standing Expiration Date:   08/09/2022   Lactate dehydrogenase    Standing Status:   Future    Number of Occurrences:   1    Standing Expiration Date:   08/09/2022   Vitamin B12    Standing Status:   Future    Number of Occurrences:   1    Standing Expiration Date:   08/09/2022   Folate    Standing Status:   Future    Number of Occurrences:   1    Standing Expiration Date:   08/09/2022   CBC with Differential/Platelet    Standing Status:   Future    Standing Expiration Date:   08/09/2022   Comprehensive metabolic panel    Standing Status:   Future    Standing Expiration Date:    08/09/2022    All questions were answered. The patient knows to call the clinic with any problems questions or concerns.  Cc Lavelle Body, MD  Thank you for this kind referral and the opportunity to participate in the care of this patient. A copy of today's note is routed to referring provider    Earlie Server, MD, PhD 08/08/2021

## 2021-08-08 NOTE — Assessment & Plan Note (Addendum)
Likely due to chronic liver disease/splenomegaly.  Rule out other etiologies.  For the work up of patient's thrombocytopenia, I recommend checking CBC, immature platelet fraction, LDH; smear review, folate, Vitamin B12, hepatitis, HIV, flowcytometry and monoclonal gammopathy workup.   His platelet count is less then 50,000, recommend platelet transfusion or TPO prior to invasive procedures. Advise patient to notify me once he knows oral surgery date.

## 2021-08-09 LAB — KAPPA/LAMBDA LIGHT CHAINS
Kappa free light chain: 59.4 mg/L — ABNORMAL HIGH (ref 3.3–19.4)
Kappa, lambda light chain ratio: 2.11 — ABNORMAL HIGH (ref 0.26–1.65)
Lambda free light chains: 28.2 mg/L — ABNORMAL HIGH (ref 5.7–26.3)

## 2021-08-09 LAB — HIV ANTIBODY (ROUTINE TESTING W REFLEX): HIV Screen 4th Generation wRfx: NONREACTIVE

## 2021-08-10 LAB — MULTIPLE MYELOMA PANEL, SERUM
Albumin SerPl Elph-Mcnc: 2.4 g/dL — ABNORMAL LOW (ref 2.9–4.4)
Albumin/Glob SerPl: 0.9 (ref 0.7–1.7)
Alpha 1: 0.2 g/dL (ref 0.0–0.4)
Alpha2 Glob SerPl Elph-Mcnc: 0.3 g/dL — ABNORMAL LOW (ref 0.4–1.0)
B-Globulin SerPl Elph-Mcnc: 0.7 g/dL (ref 0.7–1.3)
Gamma Glob SerPl Elph-Mcnc: 1.8 g/dL (ref 0.4–1.8)
Globulin, Total: 3 g/dL (ref 2.2–3.9)
IgA: 673 mg/dL — ABNORMAL HIGH (ref 90–386)
IgG (Immunoglobin G), Serum: 1677 mg/dL — ABNORMAL HIGH (ref 603–1613)
IgM (Immunoglobulin M), Srm: 64 mg/dL (ref 20–172)
Total Protein ELP: 5.4 g/dL — ABNORMAL LOW (ref 6.0–8.5)

## 2021-08-10 LAB — COMP PANEL: LEUKEMIA/LYMPHOMA

## 2021-08-21 ENCOUNTER — Ambulatory Visit: Admission: RE | Admit: 2021-08-21 | Payer: Medicare Other | Source: Ambulatory Visit

## 2021-10-23 ENCOUNTER — Other Ambulatory Visit (HOSPITAL_COMMUNITY): Payer: Self-pay | Admitting: Gastroenterology

## 2021-10-23 ENCOUNTER — Other Ambulatory Visit: Payer: Self-pay | Admitting: Gastroenterology

## 2021-10-23 DIAGNOSIS — I8510 Secondary esophageal varices without bleeding: Secondary | ICD-10-CM

## 2021-10-23 DIAGNOSIS — K7460 Unspecified cirrhosis of liver: Secondary | ICD-10-CM

## 2021-11-08 ENCOUNTER — Encounter: Payer: Self-pay | Admitting: Oncology

## 2021-11-08 ENCOUNTER — Inpatient Hospital Stay: Payer: Medicare Other | Attending: Oncology

## 2021-11-08 ENCOUNTER — Inpatient Hospital Stay (HOSPITAL_BASED_OUTPATIENT_CLINIC_OR_DEPARTMENT_OTHER): Payer: Medicare Other | Admitting: Oncology

## 2021-11-08 VITALS — BP 144/74 | HR 61 | Temp 98.1°F | Resp 18 | Wt 294.5 lb

## 2021-11-08 DIAGNOSIS — K7460 Unspecified cirrhosis of liver: Secondary | ICD-10-CM | POA: Insufficient documentation

## 2021-11-08 DIAGNOSIS — Z79899 Other long term (current) drug therapy: Secondary | ICD-10-CM | POA: Insufficient documentation

## 2021-11-08 DIAGNOSIS — D696 Thrombocytopenia, unspecified: Secondary | ICD-10-CM | POA: Insufficient documentation

## 2021-11-08 LAB — CBC WITH DIFFERENTIAL/PLATELET
Abs Immature Granulocytes: 0.03 10*3/uL (ref 0.00–0.07)
Basophils Absolute: 0 10*3/uL (ref 0.0–0.1)
Basophils Relative: 1 %
Eosinophils Absolute: 0.1 10*3/uL (ref 0.0–0.5)
Eosinophils Relative: 3 %
HCT: 40 % (ref 39.0–52.0)
Hemoglobin: 13.2 g/dL (ref 13.0–17.0)
Immature Granulocytes: 1 %
Lymphocytes Relative: 20 %
Lymphs Abs: 1.2 10*3/uL (ref 0.7–4.0)
MCH: 32.8 pg (ref 26.0–34.0)
MCHC: 33 g/dL (ref 30.0–36.0)
MCV: 99.5 fL (ref 80.0–100.0)
Monocytes Absolute: 0.4 10*3/uL (ref 0.1–1.0)
Monocytes Relative: 7 %
Neutro Abs: 3.9 10*3/uL (ref 1.7–7.7)
Neutrophils Relative %: 68 %
Platelets: 75 10*3/uL — ABNORMAL LOW (ref 150–400)
RBC: 4.02 MIL/uL — ABNORMAL LOW (ref 4.22–5.81)
RDW: 17 % — ABNORMAL HIGH (ref 11.5–15.5)
WBC: 5.7 10*3/uL (ref 4.0–10.5)
nRBC: 0 % (ref 0.0–0.2)

## 2021-11-08 LAB — COMPREHENSIVE METABOLIC PANEL
ALT: 31 U/L (ref 0–44)
AST: 67 U/L — ABNORMAL HIGH (ref 15–41)
Albumin: 2.5 g/dL — ABNORMAL LOW (ref 3.5–5.0)
Alkaline Phosphatase: 123 U/L (ref 38–126)
Anion gap: 4 — ABNORMAL LOW (ref 5–15)
BUN: 12 mg/dL (ref 6–20)
CO2: 30 mmol/L (ref 22–32)
Calcium: 10.4 mg/dL — ABNORMAL HIGH (ref 8.9–10.3)
Chloride: 103 mmol/L (ref 98–111)
Creatinine, Ser: 0.92 mg/dL (ref 0.61–1.24)
GFR, Estimated: >60 mL/min (ref >60)
Glucose, Bld: 111 mg/dL — ABNORMAL HIGH (ref 70–99)
Potassium: 4 mmol/L (ref 3.5–5.1)
Sodium: 137 mmol/L (ref 135–145)
Total Bilirubin: 2.9 mg/dL — ABNORMAL HIGH (ref 0.3–1.2)
Total Protein: 6.8 g/dL (ref 6.5–8.1)

## 2021-11-08 NOTE — Assessment & Plan Note (Signed)
Follow-up with GI 

## 2021-11-08 NOTE — Progress Notes (Signed)
Hematology/Oncology Consult Note [phone] [phone]    Patient Care Team: [name], MD as PCP - General (Family Medicine) Tsosie Billing, MD as Consulting Physician (Infectious Diseases) Earlie Server, MD as Consulting Physician (Oncology)  ASSESSMENT & PLAN:   Thrombocytopenia [facility]) Likely due to chronic liver disease/splenomegaly consumption.  Labs are reviewed and discussed with patient. Increased immature platelet fraction, normal LDH; normal smear review, normal folate, normal Vitamin B12, negative hepatitis, negative HIV,negative flowcytometry and negative M protein    Platelet count is >50,000, ok to proceed with oral surgery. Recommend inrease local compression time after surgery.   Advanced cirrhosis of liver ([place]) Follow up with GI   Orders Placed This Encounter  Procedures   CBC with Differential/Platelet    Standing Status:   Future    Standing Expiration Date:   11/09/2022   Comprehensive metabolic panel    Standing Status:   Future    Standing Expiration Date:   11/08/2022   Follow up in 6 months.  All questions were answered. The patient knows to call the clinic with any problems, questions or concerns.  Earlie Server, MD, PhD [facility] Hematology Oncology 11/08/2021    CHIEF COMPLAINTS/REASON FOR VISIT:  Follow up thrombocytopenia  HISTORY OF PRESENTING ILLNESS:  Jordan Hayden is a 56 y.o. male who was seen in consultation at the request of Amine Body, MD for evaluation of  thrombocytopenia  07/31/21 Patient had abnormal Labs which showed decreased platelet counts at  39,000 Reviewed patient's previous labs. Thrombocytopenia is chronic chronic onset , since at least 2018  Denies weight loss, fever, chills, fatigue, night sweats.  Denies hematochezia, hematuria, hematemesis, epistaxis, black tarry stool.  + easy bruising. No acute bleeding.  + right side wisdom tooth infection, he has taken 2 rounds of  antibiotics. He noticed right cervical lymphadenopthy for 3 weeks.  + history of liver cirrhosis. Former alcohol user.  He was seen by me in 2019 and lost follow up.  There is plan of oral surgery in September 2023.   INTERVAL HISTORY Jordan Hayden is a 56 y.o. male who has above history reviewed by me today presents for follow up visit for thrombocytopenia  He presents to discuss blood work results.  + right side jaw pain, upcoming oral surgery.     MEDICAL HISTORY:  Past Medical History:  Diagnosis Date   Alcohol abuse    Alcoholic cirrhosis ([place])    Alcoholism ([place])    Arthritis    Cirrhosis ([place])     SURGICAL HISTORY: Past Surgical History:  Procedure Laterality Date   ESOPHAGOGASTRODUODENOSCOPY (EGD) WITH PROPOFOL N/A 07/01/2017   Procedure: ESOPHAGOGASTRODUODENOSCOPY (EGD) WITH PROPOFOL;  Surgeon: Lucilla Lame, MD;  Location: [facility] ENDOSCOPY;  Service: Endoscopy;  Laterality: N/A;    SOCIAL HISTORY: Social History   Socioeconomic History   Marital status: Single    Spouse name: Not on file   Number of children: 1   Years of education: Not on file   Highest education level: Not on file  Occupational History   Not on file  Tobacco Use   Smoking status: Never   Smokeless tobacco: Never  Vaping Use   Vaping Use: Never used  Substance and Sexual Activity   Alcohol use: No    Comment: quit 2 years    Drug use: No   Sexual activity: Not on file  Other Topics Concern   Not on file  Social History Narrative   Not on file   Social Determinants  of Health   Financial Resource Strain: Not on file  Food Insecurity: Not on file  Transportation Needs: Not on file  Physical Activity: Not on file  Stress: Not on file  Social Connections: Not on file  Intimate Partner Violence: Not on file    FAMILY HISTORY: Family History  Problem Relation Age of Onset   Hypertension Mother    Diabetes Father    Cervical cancer Sister    Cirrhosis Brother      ALLERGIES:  is allergic to hydrocodone-acetaminophen and penicillins.  MEDICATIONS:  Current Outpatient Medications  Medication Sig Dispense Refill   albuterol (VENTOLIN HFA) 108 (90 Base) MCG/ACT inhaler Inhale into the lungs.     aspirin EC 81 MG tablet Take 81 mg by mouth daily. (Patient not taking: Reported on 07/25/2021)     doxycycline (VIBRAMYCIN) 100 MG capsule Take 100 mg by mouth 2 (two) times daily.     ferrous sulfate 325 (65 FE) MG tablet Take 325 mg by mouth every morning.     furosemide (LASIX) 20 MG tablet Take 1 tablet (20 mg total) by mouth daily. 60 tablet 0   hydrOXYzine (ATARAX/VISTARIL) 25 MG tablet Take 25 mg by mouth 3 (three) times daily as needed.     lactulose (CHRONULAC) 10 GM/15ML solution Take 30 mLs (20 g total) by mouth 3 (three) times daily. 236 mL 1   meloxicam (MOBIC) 15 MG tablet Take 1 tablet (15 mg total) by mouth daily. 30 tablet 0   pantoprazole (PROTONIX) 40 MG tablet Take 1 tablet (40 mg total) by mouth daily. 180 tablet 0   potassium chloride SA (K-DUR,KLOR-CON) 20 MEQ tablet Take 2 tablets (40 mEq total) by mouth 2 (two) times daily. 60 tablet 0   spironolactone (ALDACTONE) 25 MG tablet Take 0.5 tablets (12.5 mg total) by mouth 2 (two) times daily. (Patient not taking: Reported on 07/25/2021) 60 tablet 0   XIFAXAN 550 MG TABS tablet Take 550 mg by mouth 2 (two) times daily.     No current facility-administered medications for this visit.    Review of Systems  Constitutional:  Positive for fatigue. Negative for appetite change, chills, fever and unexpected weight change.  HENT:   Negative for hearing loss and voice change.        Wisdom tooth pain  Eyes:  Negative for eye problems and icterus.  Respiratory:  Negative for chest tightness, cough and shortness of breath.   Cardiovascular:  Negative for chest pain and leg swelling.  Gastrointestinal:  Negative for abdominal distention and abdominal pain.  Endocrine: Negative for hot flashes.   Genitourinary:  Negative for difficulty urinating, dysuria and frequency.   Musculoskeletal:  Negative for arthralgias.  Skin:  Negative for itching and rash.  Neurological:  Negative for light-headedness and numbness.  Hematological:  Positive for adenopathy. Bruises/bleeds easily.  Psychiatric/Behavioral:  Negative for confusion.     PHYSICAL EXAMINATION: ECOG PERFORMANCE STATUS: 1 - Symptomatic but completely ambulatory Vitals:   11/08/21 1427  BP: (!) 144/74  Pulse: 61  Resp: 18  Temp: 98.1 F (36.7 C)   Filed Weights   11/08/21 1427  Weight: 294 lb 8 oz (133.6 kg)    Physical Exam Constitutional:      General: He is not in acute distress. HENT:     Head: Normocephalic and atraumatic.  Eyes:     General: No scleral icterus. Neck:     Comments: Right side cervical lymphadenopathy - 2cm mobile flat lymph node with a  few small lymph nodes.  Cardiovascular:     Rate and Rhythm: Normal rate and regular rhythm.     Heart sounds: Normal heart sounds.  Pulmonary:     Effort: Pulmonary effort is normal. No respiratory distress.     Breath sounds: No wheezing.  Abdominal:     General: Bowel sounds are normal. There is no distension.     Palpations: Abdomen is soft.  Musculoskeletal:        General: No deformity. Normal range of motion.     Cervical back: Normal range of motion and neck supple.  Lymphadenopathy:     Cervical: Cervical adenopathy present.  Skin:    General: Skin is warm and dry.     Findings: No erythema or rash.  Neurological:     Mental Status: He is alert and oriented to person, place, and time. Mental status is at baseline.     Cranial Nerves: No cranial nerve deficit.     Coordination: Coordination normal.  Psychiatric:        Mood and Affect: Mood normal.      LABORATORY DATA:  I have reviewed the data as listed    Latest Ref Rng & Units 11/08/2021    1:56 PM 08/08/2021    3:43 PM 08/31/2020    6:31 AM  CBC  WBC 4.0 - 10.5 K/uL 5.7  3.4   5.9   Hemoglobin 13.0 - 17.0 g/dL 13.2  13.6  13.8   Hematocrit 39.0 - 52.0 % 40.0  39.6  40.2   Platelets 150 - 400 K/uL 75  40  64       Latest Ref Rng & Units 11/08/2021    1:56 PM 08/08/2021    3:43 PM 08/31/2020    6:31 AM  CMP  Glucose 70 - 99 mg/dL 111  93  77   BUN 6 - 20 mg/dL '12  10  12   '$ Creatinine 0.61 - 1.24 mg/dL 0.92  0.99  0.96   Sodium 135 - 145 mmol/L 137  137  137   Potassium 3.5 - 5.1 mmol/L 4.0  4.4  3.9   Chloride 98 - 111 mmol/L 103  104  103   CO2 22 - 32 mmol/L '30  30  26   '$ Calcium 8.9 - 10.3 mg/dL 10.4  9.1  9.2   Total Protein 6.5 - 8.1 g/dL 6.8  5.8  6.8   Total Bilirubin 0.3 - 1.2 mg/dL 2.9  1.8  3.1   Alkaline Phos 38 - 126 U/L 123  140  87   AST 15 - 41 U/L 67  63  84   ALT 0 - 44 U/L 31  30  33      RADIOGRAPHIC STUDIES: I have personally reviewed the radiological images as listed and agreed with the findings in the report.  No results found.

## 2021-11-08 NOTE — Assessment & Plan Note (Addendum)
Likely due to chronic liver disease/splenomegaly consumption.  Labs are reviewed and discussed with patient. Increased immature platelet fraction, normal LDH; normal smear review, normal folate, normal Vitamin B12, negative hepatitis, negative HIV,negative flowcytometry and negative M protein    Platelet count is >50,000, ok to proceed with oral surgery. Recommend inrease local compression time after surgery.

## 2021-11-08 NOTE — Progress Notes (Signed)
Patient here for follow up. Pt reports he will have mouth surgery and right side of jaw is painful

## 2021-11-23 ENCOUNTER — Other Ambulatory Visit: Payer: Self-pay

## 2021-11-23 ENCOUNTER — Encounter: Payer: Self-pay | Admitting: Internal Medicine

## 2021-11-23 ENCOUNTER — Emergency Department: Payer: Medicare Other

## 2021-11-23 ENCOUNTER — Inpatient Hospital Stay
Admission: EM | Admit: 2021-11-23 | Discharge: 2021-12-08 | DRG: 004 | Disposition: E | Payer: Medicare Other | Attending: Student in an Organized Health Care Education/Training Program | Admitting: Student in an Organized Health Care Education/Training Program

## 2021-11-23 DIAGNOSIS — J9602 Acute respiratory failure with hypercapnia: Secondary | ICD-10-CM | POA: Diagnosis present

## 2021-11-23 DIAGNOSIS — Z885 Allergy status to narcotic agent status: Secondary | ICD-10-CM

## 2021-11-23 DIAGNOSIS — G928 Other toxic encephalopathy: Secondary | ICD-10-CM | POA: Diagnosis not present

## 2021-11-23 DIAGNOSIS — R221 Localized swelling, mass and lump, neck: Secondary | ICD-10-CM | POA: Diagnosis present

## 2021-11-23 DIAGNOSIS — R791 Abnormal coagulation profile: Secondary | ICD-10-CM

## 2021-11-23 DIAGNOSIS — Z789 Other specified health status: Secondary | ICD-10-CM | POA: Diagnosis not present

## 2021-11-23 DIAGNOSIS — N170 Acute kidney failure with tubular necrosis: Secondary | ICD-10-CM | POA: Diagnosis present

## 2021-11-23 DIAGNOSIS — I871 Compression of vein: Secondary | ICD-10-CM | POA: Diagnosis present

## 2021-11-23 DIAGNOSIS — N179 Acute kidney failure, unspecified: Secondary | ICD-10-CM

## 2021-11-23 DIAGNOSIS — Z66 Do not resuscitate: Secondary | ICD-10-CM | POA: Diagnosis not present

## 2021-11-23 DIAGNOSIS — F1021 Alcohol dependence, in remission: Secondary | ICD-10-CM | POA: Diagnosis present

## 2021-11-23 DIAGNOSIS — I8510 Secondary esophageal varices without bleeding: Secondary | ICD-10-CM | POA: Diagnosis present

## 2021-11-23 DIAGNOSIS — J9601 Acute respiratory failure with hypoxia: Secondary | ICD-10-CM | POA: Diagnosis present

## 2021-11-23 DIAGNOSIS — J9501 Hemorrhage from tracheostomy stoma: Secondary | ICD-10-CM | POA: Diagnosis not present

## 2021-11-23 DIAGNOSIS — K7031 Alcoholic cirrhosis of liver with ascites: Secondary | ICD-10-CM | POA: Diagnosis present

## 2021-11-23 DIAGNOSIS — G4733 Obstructive sleep apnea (adult) (pediatric): Secondary | ICD-10-CM | POA: Diagnosis present

## 2021-11-23 DIAGNOSIS — Z79899 Other long term (current) drug therapy: Secondary | ICD-10-CM

## 2021-11-23 DIAGNOSIS — R042 Hemoptysis: Secondary | ICD-10-CM | POA: Insufficient documentation

## 2021-11-23 DIAGNOSIS — E119 Type 2 diabetes mellitus without complications: Secondary | ICD-10-CM | POA: Diagnosis present

## 2021-11-23 DIAGNOSIS — D509 Iron deficiency anemia, unspecified: Secondary | ICD-10-CM | POA: Diagnosis present

## 2021-11-23 DIAGNOSIS — D65 Disseminated intravascular coagulation [defibrination syndrome]: Secondary | ICD-10-CM | POA: Diagnosis present

## 2021-11-23 DIAGNOSIS — D696 Thrombocytopenia, unspecified: Secondary | ICD-10-CM

## 2021-11-23 DIAGNOSIS — K922 Gastrointestinal hemorrhage, unspecified: Secondary | ICD-10-CM

## 2021-11-23 DIAGNOSIS — C321 Malignant neoplasm of supraglottis: Secondary | ICD-10-CM | POA: Diagnosis present

## 2021-11-23 DIAGNOSIS — K7682 Hepatic encephalopathy: Secondary | ICD-10-CM | POA: Diagnosis present

## 2021-11-23 DIAGNOSIS — I10 Essential (primary) hypertension: Secondary | ICD-10-CM | POA: Diagnosis present

## 2021-11-23 DIAGNOSIS — R1310 Dysphagia, unspecified: Secondary | ICD-10-CM | POA: Diagnosis present

## 2021-11-23 DIAGNOSIS — D7589 Other specified diseases of blood and blood-forming organs: Secondary | ICD-10-CM | POA: Diagnosis present

## 2021-11-23 DIAGNOSIS — Z833 Family history of diabetes mellitus: Secondary | ICD-10-CM

## 2021-11-23 DIAGNOSIS — Z515 Encounter for palliative care: Secondary | ICD-10-CM

## 2021-11-23 DIAGNOSIS — D684 Acquired coagulation factor deficiency: Secondary | ICD-10-CM | POA: Diagnosis present

## 2021-11-23 DIAGNOSIS — K7030 Alcoholic cirrhosis of liver without ascites: Secondary | ICD-10-CM | POA: Diagnosis not present

## 2021-11-23 DIAGNOSIS — K219 Gastro-esophageal reflux disease without esophagitis: Secondary | ICD-10-CM | POA: Diagnosis present

## 2021-11-23 DIAGNOSIS — E870 Hyperosmolality and hypernatremia: Secondary | ICD-10-CM | POA: Diagnosis not present

## 2021-11-23 DIAGNOSIS — E860 Dehydration: Secondary | ICD-10-CM | POA: Diagnosis present

## 2021-11-23 DIAGNOSIS — Z7189 Other specified counseling: Secondary | ICD-10-CM | POA: Diagnosis not present

## 2021-11-23 DIAGNOSIS — E876 Hypokalemia: Secondary | ICD-10-CM | POA: Diagnosis not present

## 2021-11-23 DIAGNOSIS — K920 Hematemesis: Secondary | ICD-10-CM | POA: Diagnosis present

## 2021-11-23 DIAGNOSIS — D689 Coagulation defect, unspecified: Secondary | ICD-10-CM

## 2021-11-23 DIAGNOSIS — R578 Other shock: Secondary | ICD-10-CM | POA: Diagnosis not present

## 2021-11-23 DIAGNOSIS — Z6841 Body Mass Index (BMI) 40.0 and over, adult: Secondary | ICD-10-CM

## 2021-11-23 DIAGNOSIS — L0211 Cutaneous abscess of neck: Principal | ICD-10-CM

## 2021-11-23 DIAGNOSIS — Z8049 Family history of malignant neoplasm of other genital organs: Secondary | ICD-10-CM

## 2021-11-23 DIAGNOSIS — Z791 Long term (current) use of non-steroidal anti-inflammatories (NSAID): Secondary | ICD-10-CM

## 2021-11-23 DIAGNOSIS — Z8249 Family history of ischemic heart disease and other diseases of the circulatory system: Secondary | ICD-10-CM

## 2021-11-23 DIAGNOSIS — Z880 Allergy status to penicillin: Secondary | ICD-10-CM

## 2021-11-23 DIAGNOSIS — D500 Iron deficiency anemia secondary to blood loss (chronic): Secondary | ICD-10-CM | POA: Diagnosis present

## 2021-11-23 DIAGNOSIS — K7460 Unspecified cirrhosis of liver: Secondary | ICD-10-CM | POA: Diagnosis not present

## 2021-11-23 DIAGNOSIS — Z7982 Long term (current) use of aspirin: Secondary | ICD-10-CM

## 2021-11-23 LAB — CBC WITH DIFFERENTIAL/PLATELET
Abs Immature Granulocytes: 0.02 10*3/uL (ref 0.00–0.07)
Basophils Absolute: 0 10*3/uL (ref 0.0–0.1)
Basophils Relative: 0 %
Eosinophils Absolute: 0.1 10*3/uL (ref 0.0–0.5)
Eosinophils Relative: 2 %
HCT: 38.8 % — ABNORMAL LOW (ref 39.0–52.0)
Hemoglobin: 12.6 g/dL — ABNORMAL LOW (ref 13.0–17.0)
Immature Granulocytes: 0 %
Lymphocytes Relative: 24 %
Lymphs Abs: 1.5 10*3/uL (ref 0.7–4.0)
MCH: 33.3 pg (ref 26.0–34.0)
MCHC: 32.5 g/dL (ref 30.0–36.0)
MCV: 102.6 fL — ABNORMAL HIGH (ref 80.0–100.0)
Monocytes Absolute: 0.4 10*3/uL (ref 0.1–1.0)
Monocytes Relative: 7 %
Neutro Abs: 4.4 10*3/uL (ref 1.7–7.7)
Neutrophils Relative %: 67 %
Platelets: 55 10*3/uL — ABNORMAL LOW (ref 150–400)
RBC: 3.78 MIL/uL — ABNORMAL LOW (ref 4.22–5.81)
RDW: 17.5 % — ABNORMAL HIGH (ref 11.5–15.5)
WBC: 6.5 10*3/uL (ref 4.0–10.5)
nRBC: 0.5 % — ABNORMAL HIGH (ref 0.0–0.2)

## 2021-11-23 LAB — CBC
HCT: 36.2 % — ABNORMAL LOW (ref 39.0–52.0)
HCT: 38.5 % — ABNORMAL LOW (ref 39.0–52.0)
Hemoglobin: 12.2 g/dL — ABNORMAL LOW (ref 13.0–17.0)
Hemoglobin: 12.8 g/dL — ABNORMAL LOW (ref 13.0–17.0)
MCH: 33.9 pg (ref 26.0–34.0)
MCH: 34 pg (ref 26.0–34.0)
MCHC: 33.7 g/dL (ref 30.0–36.0)
MCHC: 33.2 g/dL (ref 30.0–36.0)
MCV: 100.6 fL — ABNORMAL HIGH (ref 80.0–100.0)
MCV: 102.4 fL — ABNORMAL HIGH (ref 80.0–100.0)
Platelets: 87 10*3/uL — ABNORMAL LOW (ref 150–400)
Platelets: 80 10*3/uL — ABNORMAL LOW (ref 150–400)
RBC: 3.6 MIL/uL — ABNORMAL LOW (ref 4.22–5.81)
RBC: 3.76 MIL/uL — ABNORMAL LOW (ref 4.22–5.81)
RDW: 17.4 % — ABNORMAL HIGH (ref 11.5–15.5)
RDW: 17.7 % — ABNORMAL HIGH (ref 11.5–15.5)
WBC: 9.1 10*3/uL (ref 4.0–10.5)
WBC: 12.1 10*3/uL — ABNORMAL HIGH (ref 4.0–10.5)
nRBC: 0.8 % — ABNORMAL HIGH (ref 0.0–0.2)
nRBC: 0.5 % — ABNORMAL HIGH (ref 0.0–0.2)

## 2021-11-23 LAB — COMPREHENSIVE METABOLIC PANEL
ALT: 30 U/L (ref 0–44)
AST: 68 U/L — ABNORMAL HIGH (ref 15–41)
Albumin: 2.1 g/dL — ABNORMAL LOW (ref 3.5–5.0)
Alkaline Phosphatase: 89 U/L (ref 38–126)
Anion gap: 9 (ref 5–15)
BUN: 22 mg/dL — ABNORMAL HIGH (ref 6–20)
CO2: 29 mmol/L (ref 22–32)
Calcium: 12.2 mg/dL — ABNORMAL HIGH (ref 8.9–10.3)
Chloride: 106 mmol/L (ref 98–111)
Creatinine, Ser: 1.4 mg/dL — ABNORMAL HIGH (ref 0.61–1.24)
GFR, Estimated: 60 mL/min — ABNORMAL LOW (ref >60)
Glucose, Bld: 111 mg/dL — ABNORMAL HIGH (ref 70–99)
Potassium: 5.5 mmol/L — ABNORMAL HIGH (ref 3.5–5.1)
Sodium: 144 mmol/L (ref 135–145)
Total Bilirubin: 4.4 mg/dL — ABNORMAL HIGH (ref 0.3–1.2)
Total Protein: 6.1 g/dL — ABNORMAL LOW (ref 6.5–8.1)

## 2021-11-23 LAB — AMMONIA: Ammonia: 48 umol/L — ABNORMAL HIGH (ref 9–35)

## 2021-11-23 LAB — MRSA NEXT GEN BY PCR, NASAL: MRSA by PCR Next Gen: NOT DETECTED

## 2021-11-23 LAB — PROTIME-INR
INR: 2.3 — ABNORMAL HIGH (ref 0.8–1.2)
Prothrombin Time: 25.3 seconds — ABNORMAL HIGH (ref 11.4–15.2)

## 2021-11-23 LAB — FIBRINOGEN: Fibrinogen: 149 mg/dL — ABNORMAL LOW (ref 210–475)

## 2021-11-23 LAB — MAGNESIUM: Magnesium: 2.6 mg/dL — ABNORMAL HIGH (ref 1.7–2.4)

## 2021-11-23 LAB — GLUCOSE, CAPILLARY: Glucose-Capillary: 88 mg/dL (ref 70–99)

## 2021-11-23 MED ORDER — METRONIDAZOLE 500 MG/100ML IV SOLN
500.0000 mg | Freq: Two times a day (BID) | INTRAVENOUS | Status: DC
  Administered 2021-11-23 – 2021-11-27 (×9): 500 mg via INTRAVENOUS
  Filled 2021-11-23 (×9): qty 100

## 2021-11-23 MED ORDER — PANTOPRAZOLE SODIUM 40 MG IV SOLR
40.0000 mg | Freq: Two times a day (BID) | INTRAVENOUS | Status: DC
  Administered 2021-11-27 – 2021-11-30 (×8): 40 mg via INTRAVENOUS
  Filled 2021-11-23 (×8): qty 10

## 2021-11-23 MED ORDER — ALBUTEROL SULFATE (2.5 MG/3ML) 0.083% IN NEBU: 2.5000 mg | INHALATION_SOLUTION | Freq: Four times a day (QID) | RESPIRATORY_TRACT | Status: DC | PRN

## 2021-11-23 MED ORDER — ACETAMINOPHEN 325 MG PO TABS: 650.0000 mg | ORAL_TABLET | Freq: Four times a day (QID) | ORAL | Status: DC | PRN

## 2021-11-23 MED ORDER — ONDANSETRON HCL 4 MG/2ML IJ SOLN: 4.0000 mg | Freq: Four times a day (QID) | INTRAMUSCULAR | Status: AC | PRN

## 2021-11-23 MED ORDER — CHLORHEXIDINE GLUCONATE CLOTH 2 % EX PADS
6.0000 | MEDICATED_PAD | Freq: Every day | CUTANEOUS | Status: DC
  Administered 2021-11-23 – 2021-12-01 (×10): 6 via TOPICAL

## 2021-11-23 MED ORDER — SODIUM CHLORIDE 0.9 % IV SOLN
2.0000 g | INTRAVENOUS | Status: DC
  Administered 2021-11-23: 2 g via INTRAVENOUS
  Filled 2021-11-23: qty 20

## 2021-11-23 MED ORDER — SODIUM CHLORIDE 0.9 % IV SOLN
1.0000 g | Freq: Three times a day (TID) | INTRAVENOUS | Status: DC
  Administered 2021-11-23 – 2021-11-24 (×2): 1 g via INTRAVENOUS
  Filled 2021-11-23: qty 1
  Filled 2021-11-23 (×2): qty 20

## 2021-11-23 MED ORDER — ACETAMINOPHEN 650 MG RE SUPP: 650.0000 mg | Freq: Four times a day (QID) | RECTAL | Status: DC | PRN

## 2021-11-23 MED ORDER — ONDANSETRON HCL 4 MG PO TABS: 4.0000 mg | ORAL_TABLET | Freq: Four times a day (QID) | ORAL | Status: AC | PRN

## 2021-11-23 MED ORDER — PANTOPRAZOLE SODIUM 40 MG IV SOLR
40.0000 mg | Freq: Once | INTRAVENOUS | Status: AC
  Administered 2021-11-23: 40 mg via INTRAVENOUS
  Filled 2021-11-23: qty 10

## 2021-11-23 MED ORDER — DEXAMETHASONE SODIUM PHOSPHATE 10 MG/ML IJ SOLN
10.0000 mg | Freq: Once | INTRAMUSCULAR | Status: AC
  Administered 2021-11-23: 10 mg via INTRAVENOUS
  Filled 2021-11-23: qty 1

## 2021-11-23 MED ORDER — PANTOPRAZOLE INFUSION (NEW) - SIMPLE MED
8.0000 mg/h | INTRAVENOUS | Status: AC
  Administered 2021-11-23 – 2021-11-26 (×8): 8 mg/h via INTRAVENOUS
  Filled 2021-11-23 (×7): qty 100

## 2021-11-23 MED ORDER — SODIUM CHLORIDE 0.9 % IV SOLN
50.0000 ug/h | INTRAVENOUS | Status: AC
  Administered 2021-11-23 – 2021-11-26 (×7): 50 ug/h via INTRAVENOUS
  Filled 2021-11-23 (×9): qty 1

## 2021-11-23 MED ORDER — SODIUM CHLORIDE 0.9 % IV SOLN: INTRAVENOUS | Status: AC

## 2021-11-23 MED ORDER — IOHEXOL 300 MG/ML  SOLN
75.0000 mL | Freq: Once | INTRAMUSCULAR | Status: AC | PRN
  Administered 2021-11-23: 75 mL via INTRAVENOUS

## 2021-11-23 MED ORDER — SODIUM CHLORIDE 0.9% IV SOLUTION
Freq: Once | INTRAVENOUS | Status: AC
  Filled 2021-11-23: qty 250

## 2021-11-23 MED ORDER — PANTOPRAZOLE 80MG IVPB - SIMPLE MED
80.0000 mg | Freq: Once | INTRAVENOUS | Status: AC
  Administered 2021-11-23: 80 mg via INTRAVENOUS
  Filled 2021-11-23: qty 100

## 2021-11-23 MED ORDER — DEXAMETHASONE SODIUM PHOSPHATE 10 MG/ML IJ SOLN
10.0000 mg | Freq: Every day | INTRAMUSCULAR | Status: AC
  Administered 2021-11-24: 10 mg via INTRAVENOUS
  Filled 2021-11-23: qty 1

## 2021-11-23 MED ORDER — OCTREOTIDE LOAD VIA INFUSION
50.0000 ug | Freq: Once | INTRAVENOUS | Status: AC
  Administered 2021-11-23: 50 ug via INTRAVENOUS
  Filled 2021-11-23: qty 25

## 2021-11-23 NOTE — Assessment & Plan Note (Signed)
-   I discussed case with GI service, Dr. Virgina Jock does not believe the patient is bleeding from a variceal bleed given the CT imaging - However with his history of advanced liver disease, he recommends octreotide and Protonix gtt. along with antibiotic - Octreotide bolus and gtt. with Protonix bolus and gtt. ordered - GI has been consulted and will see the patient

## 2021-11-23 NOTE — Consult Note (Signed)
Hematology/Oncology Consult Note [facility] [phone] [phone]  Patient Care Team: Nolberto Body, MD as PCP - General (Family Medicine) Tsosie Billing, MD as Consulting Physician (Infectious Diseases) Earlie Server, MD as Consulting Physician (Oncology)   Name of the patient: Jordan Hayden  [id number]  11-26-1965   Reason for referral-right sided neck mass and thrombocytopenia   Referring physician- Dr Rupert Stacks  Date of visit: 12/02/2021  History of presenting illness-patient is a 56 year old male seen for neck mass, adenopathy, and thrombocytopenia. He has history of alcohol related cirrhosis of the liver and thrombocytopenia, morbid obesity, IDA, HTN. He's followed by Dr Tasia Catchings for thrombocytopenia secondary to cirrhosis.   Over past months he reports gradually enlarging neck mass with worsening dysphagia. Was seen for this earlier in the year, thought to be abscess from dental procedure but didn't respond to antibiotics. Imaging was recommended but not performed. He presented to the ER with enlarging neck mass and hematemesis on 12/01/2021. Reported vomiting nearly liter of blood with clots earlier today. Also reported nosebleed.   CT 12/07/2021- CT Soft Tissue Neck W Contrast-  IMPRESSION: 1. 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 Mass extends down to the vocal cords and could involve the right vocal cord. Mass extends up to the right lateral oropharynx. 2. Malignant adenopathy in the neck bilaterally right greater than left. Largest lymph node mass is in the right neck measuring 29 mm and 20 mm. 3. 5 mm nodule superior segment right lower lobe and 3 mm nodule left upper lobe. Metastatic disease is possible. Chest CT recommended. 4. Right jugular vein is compressed by the large lymph node mass on the right. No thrombus. Left jugular vein narrowed but  patent.  He reports approximately 100 lb weight loss over past year. Denies pain or shortness of breath. Endorses changes in dentition and vocal changes with increased hoarseness. No additional bleeding since hospitalization.   He is disabled d/t cirrhosis but previously worked in Medical laboratory scientific officer. He lives with his girlfriend Neoma Laming and has a 52 year old son who lives nearby. He denies shortness of breath, ear pain, changes in hearing, or chest pain.   ECOG PS- 2  Pain scale- 0  Review of systems- Review of Systems  Constitutional:  Positive for malaise/fatigue and weight loss. Negative for chills and fever.  HENT:  Negative for congestion, ear discharge, ear pain, hearing loss, nosebleeds, sore throat and tinnitus.   Eyes:  Negative for blurred vision, double vision and pain.  Respiratory:  Negative for cough, hemoptysis, shortness of breath and wheezing.   Cardiovascular:  Negative for chest pain, palpitations and leg swelling.  Gastrointestinal:  Negative for abdominal pain, blood in stool, constipation, diarrhea, melena, nausea and vomiting.  Genitourinary:  Negative for dysuria and urgency.  Musculoskeletal:  Negative for back pain, falls, joint pain and myalgias.  Skin:  Negative for itching and rash.  Neurological:  Negative for dizziness, tingling, sensory change, loss of consciousness, weakness and headaches.  Endo/Heme/Allergies:  Negative for environmental allergies. Does not bruise/bleed easily.  Psychiatric/Behavioral:  Negative for depression. The patient is not nervous/anxious and does not have insomnia.     Allergies  Allergen Reactions   Hydrocodone-Acetaminophen Swelling    Mouth Swelling    Penicillins Swelling    Patient Active Problem List   Diagnosis Date Noted   Neck mass 11/12/2021   Hepatic encephalopathy ([place]) 08/30/2020   Sacroiliitis ([place]) 09/30/2018  Bacteremia due to Gram-negative bacteria 09/12/2018   Cellulitis 06/16/2018   Morbid obesity  with BMI of 40.0-44.9, adult ([place]) 02/12/2018   Sepsis ([place]) 06/28/2017   Iron deficiency anemia 05/19/2017   Thrombocytopenia ([place]) 04/01/2017   Anasarca 02/29/2016   Advanced cirrhosis of liver ([place]) 02/17/2016   Dupuytren's contracture of right hand 02/17/2016   Hyperbilirubinemia 02/17/2016    Past Medical History:  Diagnosis Date   Alcohol abuse    Alcoholic cirrhosis ([place])    Alcoholism ([place])    Arthritis    Cirrhosis ([place])     Past Surgical History:  Procedure Laterality Date   ESOPHAGOGASTRODUODENOSCOPY (EGD) WITH PROPOFOL N/A 07/01/2017   Procedure: ESOPHAGOGASTRODUODENOSCOPY (EGD) WITH PROPOFOL;  Surgeon: Lucilla Lame, MD;  Location: [facility] ENDOSCOPY;  Service: Endoscopy;  Laterality: N/A;    Social History   Socioeconomic History   Marital status: Single    Spouse name: Not on file   Number of children: 1   Years of education: Not on file   Highest education level: Not on file  Occupational History   Not on file  Tobacco Use   Smoking status: Never   Smokeless tobacco: Never  Vaping Use   Vaping Use: Never used  Substance and Sexual Activity   Alcohol use: No    Comment: quit 6 years   Drug use: No   Sexual activity: Yes    Partners: Female  Other Topics Concern   Not on file  Social History Narrative   Not on file   Social Determinants of Health   Financial Resource Strain: Not on file  Food Insecurity: Not on file  Transportation Needs: Not on file  Physical Activity: Not on file  Stress: Not on file  Social Connections: Not on file  Intimate Partner Violence: Not on file     Family History  Problem Relation Age of Onset   Hypertension Mother    Diabetes Father    Cervical cancer Sister    Cirrhosis Brother      Current Facility-Administered Medications:    0.9 %  sodium chloride infusion (Manually program via Guardrails IV Fluids), , Intravenous, Once, Cox, Amy N, DO   0.9 %  sodium chloride infusion, , Intravenous, Continuous, Cox,  Amy N, DO   acetaminophen (TYLENOL) tablet 650 mg, 650 mg, Oral, Q6H PRN **OR** acetaminophen (TYLENOL) suppository 650 mg, 650 mg, Rectal, Q6H PRN, Cox, Amy N, DO   cefTRIAXone (ROCEPHIN) 2 g in sodium chloride 0.9 % 100 mL IVPB, 2 g, Intravenous, Q24H, Stopped at 11/09/2021 1545 **AND** metroNIDAZOLE (FLAGYL) IVPB 500 mg, 500 mg, Intravenous, Q12H, Cox, Amy N, DO, Stopped at 11/14/2021 1620   dexamethasone (DECADRON) injection 10 mg, 10 mg, Intravenous, Once, Cox, Amy N, DO   [START ON 11/11/2021] dexamethasone (DECADRON) injection 10 mg, 10 mg, Intravenous, Daily, Cox, Amy N, DO   octreotide (SANDOSTATIN) 2 mcg/mL load via infusion 50 mcg, 50 mcg, Intravenous, Once **AND** octreotide (SANDOSTATIN) 500 mcg in sodium chloride 0.9 % 250 mL (2 mcg/mL) infusion, 50 mcg/hr, Intravenous, Continuous, Cox, Amy N, DO   ondansetron (ZOFRAN) tablet 4 mg, 4 mg, Oral, Q6H PRN **OR** ondansetron (ZOFRAN) injection 4 mg, 4 mg, Intravenous, Q6H PRN, Cox, Amy N, DO   pantoprazole (PROTONIX) 80 mg /NS 100 mL IVPB, 80 mg, Intravenous, Once, Cox, Amy N, DO   [START ON 11/27/2021] pantoprazole (PROTONIX) injection 40 mg, 40 mg, Intravenous, Q12H, Cox, Amy N, DO   pantoprozole (PROTONIX) 80 mg /NS 100 mL infusion, 8  mg/hr, Intravenous, Continuous, Cox, Amy N, DO  Current Outpatient Medications:    chlorhexidine (PERIDEX) 0.12 % solution, Use as directed 5 mLs in the mouth or throat 2 (two) times daily., Disp: , Rfl:    albuterol (VENTOLIN HFA) 108 (90 Base) MCG/ACT inhaler, Inhale into the lungs. (Patient not taking: Reported on 11/20/2021), Disp: , Rfl:    aspirin EC 81 MG tablet, Take 81 mg by mouth daily. (Patient not taking: Reported on 07/25/2021), Disp: , Rfl:    doxycycline (VIBRAMYCIN) 100 MG capsule, Take 100 mg by mouth 2 (two) times daily. (Patient not taking: Reported on 11/28/2021), Disp: , Rfl:    ferrous sulfate 325 (65 FE) MG tablet, Take 325 mg by mouth every morning., Disp: , Rfl:    furosemide (LASIX) 20  MG tablet, Take 1 tablet (20 mg total) by mouth daily., Disp: 60 tablet, Rfl: 0   hydrOXYzine (ATARAX/VISTARIL) 25 MG tablet, Take 25 mg by mouth 3 (three) times daily as needed., Disp: , Rfl:    lactulose (CHRONULAC) 10 GM/15ML solution, Take 30 mLs (20 g total) by mouth 3 (three) times daily., Disp: 236 mL, Rfl: 1   meloxicam (MOBIC) 15 MG tablet, Take 1 tablet (15 mg total) by mouth daily., Disp: 30 tablet, Rfl: 0   pantoprazole (PROTONIX) 40 MG tablet, Take 1 tablet (40 mg total) by mouth daily., Disp: 180 tablet, Rfl: 0   potassium chloride SA (K-DUR,KLOR-CON) 20 MEQ tablet, Take 2 tablets (40 mEq total) by mouth 2 (two) times daily., Disp: 60 tablet, Rfl: 0   spironolactone (ALDACTONE) 25 MG tablet, Take 0.5 tablets (12.5 mg total) by mouth 2 (two) times daily., Disp: 60 tablet, Rfl: 0   traMADol (ULTRAM) 50 MG tablet, Take 50 mg by mouth every 6 (six) hours as needed for moderate pain. (Patient not taking: Reported on 11/20/2021), Disp: , Rfl:    XIFAXAN 550 MG TABS tablet, Take 550 mg by mouth 2 (two) times daily., Disp: , Rfl:    Physical exam:  Vitals:   11/17/2021 1414 11/09/2021 1416 11/15/2021 1837  BP: 107/76  93/70  Pulse: 98  99  Resp:   18  Temp: 98.3 F (36.8 C)  97.8 F (36.6 C)  TempSrc: Axillary  Axillary  SpO2: 99%  97%  Weight:  273 lb 1.6 oz (123.9 kg)   Height:  '5\' 8"'$  (1.727 m)    Physical Exam Constitutional:      Appearance: He is not ill-appearing.  Eyes:     General: No scleral icterus. Cardiovascular:     Rate and Rhythm: Normal rate and regular rhythm.  Pulmonary:     Effort: Pulmonary effort is normal.  Abdominal:     General: There is distension.     Palpations: Abdomen is soft.  Musculoskeletal:     Right lower leg: No edema.     Left lower leg: No edema.  Lymphadenopathy:     Cervical: Cervical adenopathy present.     Upper Body:     Right upper body: No axillary or pectoral adenopathy.     Left upper body: No axillary or pectoral adenopathy.   Skin:    General: Skin is dry.     Coloration: Skin is not pale.  Neurological:     Mental Status: He is alert and oriented to person, place, and time.  Psychiatric:        Mood and Affect: Mood normal.        Behavior: Behavior normal.  Latest Ref Rng & Units 11/25/2021    2:22 PM 11/08/2021    1:56 PM 08/08/2021    3:43 PM  CBC  WBC 4.0 - 10.5 K/uL 6.5  5.7  3.4   Hemoglobin 13.0 - 17.0 g/dL 12.6  13.2  13.6   Hematocrit 39.0 - 52.0 % 38.8  40.0  39.6   Platelets 150 - 400 K/uL 55  75  40        Latest Ref Rng & Units 11/18/2021    2:22 PM 11/08/2021    1:56 PM 08/08/2021    3:43 PM  CMP  Glucose 70 - 99 mg/dL 111  111  93   BUN 6 - 20 mg/dL '22  12  10   '$ Creatinine 0.61 - 1.24 mg/dL 1.40  0.92  0.99   Sodium 135 - 145 mmol/L 144  137  137   Potassium 3.5 - 5.1 mmol/L 5.5  4.0  4.4   Chloride 98 - 111 mmol/L 106  103  104   CO2 22 - 32 mmol/L '29  30  30   '$ Calcium 8.9 - 10.3 mg/dL 12.2  10.4  9.1   Total Protein 6.5 - 8.1 g/dL 6.1  6.8  5.8   Total Bilirubin 0.3 - 1.2 mg/dL 4.4  2.9  1.8   Alkaline Phos 38 - 126 U/L 89  123  140   AST 15 - 41 U/L 68  67  63   ALT 0 - 44 U/L '30  31  30    '$ CT Soft Tissue Neck W Contrast  Result Date: 11/11/2021 CLINICAL DATA:  Neck mass EXAM: CT NECK WITH CONTRAST TECHNIQUE: Multidetector CT imaging of the neck was performed using the standard protocol following the bolus administration of intravenous contrast. RADIATION DOSE REDUCTION: This exam was performed according to the departmental dose-optimization program which includes automated exposure control, adjustment of the mA and/or kV according to patient size and/or use of iterative reconstruction technique. CONTRAST:  20m OMNIPAQUE IOHEXOL 300 MG/ML  SOLN COMPARISON:  None Available. FINDINGS: Pharynx and larynx: Mucosal mass involving the right supraglottic airway extending into the airway and causing significant airway narrowing. Mass is irregular and extends down to the vocal  cords and could involve the right vocal cord. Epicenter is above the vocal cord. Mass extends up to the right lateral oropharynx. Salivary glands: No inflammation, mass, or stone. Right submandibular gland surrounded by malignant lymph nodes. Thyroid: Negative Lymph nodes: Malignant adenopathy in neck bilaterally right greater than left. Large conglomeration of lymph nodes throughout the right neck, many of which have necrosis. Right submandibular necrotic nodes measuring 15 mm and 14 mm. Right level 2 lymph nodes 29 mm and 20 mm. Right level 3 lymph nodes 15 mm and 21 mm and 17 mm. Right level 4 lymph nodes 17 mm and 28 mm. Submental lymph node to the right of midline 17 mm. Small necrotic left level 2 lymph node 9 mm. Posterior lymph nodes on the left measuring 14 mm and 13 mm. Additional smaller lymph nodes on the left. Vascular: Right jugular vein is compressed by the large lymph node mass on the right. No thrombus. Left jugular vein narrowed but patent. Limited intracranial: Negative Visualized orbits: Negative Mastoids and visualized paranasal sinuses: Mucosal edema paranasal sinuses. No air-fluid level. Mastoid clear. Skeleton: No acute abnormality. Upper chest: 5 mm nodule in the superior segment of the right lower lobe. 3 mm nodule left upper lobe. Other: None IMPRESSION: 1. Large mucosal  mass in the right supraglottic airway extending into the airway causing significant airway narrowing. Mass extends down to the vocal cords and could involve the right vocal cord. Mass extends up to the right lateral oropharynx. 2. Malignant adenopathy in the neck bilaterally right greater than left. Largest lymph node mass is in the right neck measuring 29 mm and 20 mm. 3. 5 mm nodule superior segment right lower lobe and 3 mm nodule left upper lobe. Metastatic disease is possible. Chest CT recommended. 4. Right jugular vein is compressed by the large lymph node mass on the right. No thrombus. Left jugular vein narrowed  but patent. 5. These results were called by telephone at the time of interpretation on 11/21/2021 at 3:49 pm to provider [facility], who verbally acknowledged these results. Electronically Signed   By: Franchot Gallo M.D.   On: 11/08/2021 15:50    Assessment and plan- Patient is a 56 y.o. male currently hospitalized for worsening dysphagia.   Neck mass and adenopathy- large supraglottic mass with narrowing of airway and compression of jugular with associated lymphadenopathy. We discussed that findings highly suspicious for malignancy which I discussed with patient. Plan is for him to go to OR with Dr Pryor Ochoa tomorrow for trach and biopsy. Ok with trial of steroids. Would recommend ct chest/abdomen/pelvis but he's recently had iv contrast and will hold off for now to give his kidneys some time to recover.  Thrombocytopenia- secondary to cirrhosis. On admission, platelets were stable at 55 however, with plan for surgery tomorrow, goal ~ 100. Hospitalist is coordinating transfusion overnight. Will check fibrinogen. Goal fibrinogen 200. Plan is if 100-200, give cryoprecipitate, 10 units. If < 100, give 20 units. If fibrinogen > 200 with prolonged PT/PTT, give FFP 10 ml/kg.   At patient's request, I spoke to his girlfriend, Neoma Laming by phone and updated her on patient's condition and plan.   Thank you for this kind referral and the opportunity to participate in the care of this pleasant patient.   Visit Diagnosis 1. Neck abscess   2. AKI (acute kidney injury) ([place])   3. Cirrhosis of liver without ascites, unspecified hepatic cirrhosis type ([place])   4. UGIB (upper gastrointestinal bleed)    Beckey Rutter, DNP, AGNP-C [place] at [facility] [phone] (clinic) 12/06/2021

## 2021-11-23 NOTE — Hospital Course (Addendum)
[name] is a 56 year old male with history of advanced alcohol associated liver cirrhosis, alcohol abuse currently in remission, last etoh drink was six years ago, thrombocytopenia, iron deficiency anemia, morbid obesity, who presents to the ED for chief concern of nausea and vomiting blood.  Initial vitals in the emergency department showed temperature of 98.3, respiration rate 14, heart rate 98, blood pressure 107/76, SPO2 of 99% on room air.  Serum sodium is 144, potassium 5.5, chloride 106, bicarb 29, BUN of 22, serum creatinine 1.40, GFR 60, nonfasting blood glucose 111, WBC 6.5, hemoglobin 12.6, platelets of 55.  EDP consulted ENT, Dr. Pryor Ochoa  ED treatment: None

## 2021-11-23 NOTE — H&P (Addendum)
History and Physical   Jordan Hayden XIP:[id number] DOB: 07-Jul-1965 DOA: 11/14/2021  PCP: Juwaun Body, MD  Outpatient Specialists: Dr. Tasia Catchings, medical oncology Patient coming from: Home via EMS  I have personally briefly reviewed patient's old medical records in [place].  Chief Concern: Hemoptysis  HPI: [name] is a 56 year old male with history of advanced alcohol associated liver cirrhosis, alcohol abuse currently in remission, last etoh drink was six years ago, thrombocytopenia, iron deficiency anemia, morbid obesity, who presents to the ED for chief concern of nausea and vomiting blood.  Initial vitals in the emergency department showed temperature of 98.3, respiration rate 14, heart rate 98, blood pressure 107/76, SPO2 of 99% on room air.  Serum sodium is 144, potassium 5.5, chloride 106, bicarb 29, BUN of 22, serum creatinine 1.40, GFR 60, nonfasting blood glucose 111, WBC 6.5, hemoglobin 12.6, platelets of 55.  EDP consulted ENT, Dr. Pryor Ochoa  ED treatment: None  At bedside, he is able to tell me his name, he knows he is in the hospital and he knows the current calendar year.  He reports that over the last 4 days he has not been able to keep any food down and has not been able to take his medications.  Today he vomited approximately 500 to 750 mL of blood.  He endorses difficulty swallowing.  He denies EtOH.  He states his last alcohol beverage was 6 years ago.  He denies tobacco and recreational use.  He denies any recent fever, chest pain, shortness of breath, abdominal pain, dysuria, hematuria, diarrhea.  On further discussion patient and his girlfriend at bedside endorses that he has had poor p.o. intake due to difficulty swallowing for several months.  Social history: He lives with his girlfriend.  He denies current EtOH, recreational drug use, tobacco use.  He reports his last alcoholic p.o. intake was at least 6 years  ago.  ROS: Constitutional: no weight change, no fever ENT/Mouth: no sore throat, no rhinorrhea Eyes: no eye pain, no vision changes Cardiovascular: no chest pain, no dyspnea,  no edema, no palpitations Respiratory: no cough, no sputum, no wheezing Gastrointestinal: + nausea, + vomiting, no diarrhea, no constipation Genitourinary: no urinary incontinence, no dysuria, no hematuria Musculoskeletal: no arthralgias, no myalgias Skin: no skin lesions, no pruritus, Neuro: + weakness, no loss of consciousness, no syncope Psych: no anxiety, no depression, + decrease appetite Heme/Lymph: no bruising, no bleeding  ED Course: Discussed with emergency medicine provider, patient requiring hospitalization for chief concerns of large mucosal mass with significant airway narrowing.  Assessment/Plan  Principal Problem:   Neck mass Active Problems:   Iron deficiency anemia   Advanced cirrhosis of liver (HCC)   Hyperbilirubinemia   Morbid obesity with BMI of 40.0-44.9, adult (HCC)   Thrombocytopenia (HCC)   Hemoptysis   Assessment and Plan:  * Neck mass - Meropenem, Flagyl - Decadron 10 mg IV one-time dose ordered on admission and 10 mg IV, 1 time a dose ordered for 11/26/2021 - ENT has been consulted and patient will be taken to the OR on 12/05/2021 - Dr. Pryor Ochoa requesting stepdown bed on admission and ICU care postop - Patient is currently stable at bedside, and should he decompensate, ENT specialist should be called emergently overnight, discussed with cross coverage provider - Admit to inpatient, stepdown  Hemoptysis - I discussed case with GI service, Dr. Virgina Jock does not believe the patient is bleeding from a variceal bleed given the CT imaging - However with  his history of advanced liver disease, he recommends octreotide and Protonix gtt. along with antibiotic - Octreotide bolus and gtt. with Protonix bolus and gtt. ordered - GI has been consulted and will see the  patient  Thrombocytopenia ([place]) - Goal platelet is greater than 100 - Hematology/oncology has been consulted - Oncology service will order fibrinogen, cryoprecipitate, FFP - CBC every 4 hours  Morbid obesity with BMI of 40.0-44.9, adult ([place]) - This complicates overall care and prognosis.   Hyperbilirubinemia - Repeat LFTs in the a.m. - Presumed secondary to inability of patient to tolerate p.o. intake therefore has not been taking his spironolactone, lactulose, rifaximin  Advanced cirrhosis of liver (HCC) - Currently holding home lactulose, Xifaxan, spironolactone due to strict n.p.o.  - Continue to monitor, if needed, lactulose can be given rectally  Right jugular vein compression, per CT read secondary to mass effect from large lymph node mass on the right - CT read negative for thrombus - Left jugular vein narrowed but patent - Oncology service has been consulted, Beckey Rutter is aware and will see the patient  DVT prophylaxis-pharmacologic DVT prophylaxis not ordered on admission due to thrombocytopenia and hemoptysis - AM team to initiate pharmacologic DVT prophylaxis when the benefits outweigh the risk  Chart reviewed.   DVT prophylaxis: TED hose Code Status: Full code Diet: Strict n.p.o. Family Communication: Updated girlfriend, Neoma Laming at bedside with patient's permission Disposition Plan: Pending clinical course Consults called: ENT, gastroenterology, oncology Admission status: Stepdown, inpatient  Past Medical History:  Diagnosis Date   Alcohol abuse    Alcoholic cirrhosis ([place])    Alcoholism ([place])    Arthritis    Cirrhosis ([place])    Past Surgical History:  Procedure Laterality Date   ESOPHAGOGASTRODUODENOSCOPY (EGD) WITH PROPOFOL N/A 07/01/2017   Procedure: ESOPHAGOGASTRODUODENOSCOPY (EGD) WITH PROPOFOL;  Surgeon: Lucilla Lame, MD;  Location: [facility] ENDOSCOPY;  Service: Endoscopy;  Laterality: N/A;   Social History:  reports that he has never smoked. He has  never used smokeless tobacco. He reports that he does not drink alcohol and does not use drugs.  Allergies  Allergen Reactions   Hydrocodone-Acetaminophen Swelling    Mouth Swelling    Penicillins Swelling   Family History  Problem Relation Age of Onset   Hypertension Mother    Diabetes Father    Cervical cancer Sister    Cirrhosis Brother    Family history: Family history reviewed and not pertinent  Prior to Admission medications   Medication Sig Start Date End Date Taking? Authorizing Provider  chlorhexidine (PERIDEX) 0.12 % solution Use as directed 5 mLs in the mouth or throat 2 (two) times daily. 11/16/21  Yes [provider]  traMADol (ULTRAM) 50 MG tablet Take 50 mg by mouth every 6 (six) hours as needed for moderate pain. 11/16/21  Yes [provider]  albuterol (VENTOLIN HFA) 108 (90 Base) MCG/ACT inhaler Inhale into the lungs. 02/02/21 02/02/22  [provider]  aspirin EC 81 MG tablet Take 81 mg by mouth daily. Patient not taking: Reported on 07/25/2021 03/05/16   [provider]  doxycycline (VIBRAMYCIN) 100 MG capsule Take 100 mg by mouth 2 (two) times daily. 07/19/21   [provider]  ferrous sulfate 325 (65 FE) MG tablet Take 325 mg by mouth every morning. 06/05/20   [provider]  furosemide (LASIX) 20 MG tablet Take 1 tablet (20 mg total) by mouth daily. 07/02/17 08/31/20  Salary, Holly Bodily D, MD  hydrOXYzine (ATARAX/VISTARIL) 25 MG tablet Take 25  mg by mouth 3 (three) times daily as needed. 04/01/17   [provider]  lactulose (CHRONULAC) 10 GM/15ML solution Take 30 mLs (20 g total) by mouth 3 (three) times daily. 08/31/20   Ezekiel Slocumb, DO  meloxicam (MOBIC) 15 MG tablet Take 1 tablet (15 mg total) by mouth daily. 07/22/18   Cuthriell, Charline Bills, PA-C  pantoprazole (PROTONIX) 40 MG tablet Take 1 tablet (40 mg total) by mouth daily. 07/02/17 08/31/20  Salary, Avel Peace, MD  potassium chloride SA (K-DUR,KLOR-CON)  20 MEQ tablet Take 2 tablets (40 mEq total) by mouth 2 (two) times daily. 07/02/17   Salary, Avel Peace, MD  spironolactone (ALDACTONE) 25 MG tablet Take 0.5 tablets (12.5 mg total) by mouth 2 (two) times daily. Patient not taking: Reported on 07/25/2021 07/02/17   Salary, Avel Peace, MD  XIFAXAN 550 MG TABS tablet Take 550 mg by mouth 2 (two) times daily. 08/08/20   [provider]   Physical Exam: Vitals:   11/25/2021 1416 11/22/2021 1837 11/30/2021 1852 11/24/2021 1900  BP:  93/70 108/86 115/70  Pulse:  99 99 98  Resp:  '18 18 14  '$ Temp:  97.8 F (36.6 C) 97.8 F (36.6 C)   TempSrc:  Axillary Axillary   SpO2:  97% 98% 98%  Weight: 123.9 kg     Height: '5\' 8"'$  (1.727 m)      Constitutional: appears older than chronological age, frail, chronically ill, NAD, calm Eyes: PERRL, lids and conjunctivae normal ENMT: Mucous membranes are moist. Posterior pharynx clear of any exudate or lesions. Age-appropriate dentition. Hearing appropriate Neck: normal, supple, no masses, no thyromegaly Respiratory: clear to auscultation bilaterally, no wheezing, no crackles. Normal respiratory effort. No accessory muscle use.  Cardiovascular: Regular rate and rhythm, no murmurs / rubs / gallops. No extremity edema. 2+ pedal pulses. No carotid bruits.  Abdomen: Morbidly obese abdomen, no tenderness, no masses palpated, no hepatosplenomegaly. Bowel sounds positive.  Musculoskeletal: no clubbing / cyanosis. No joint deformity upper and lower extremities. Good ROM, no contractures, no atrophy. Normal muscle tone.  Skin: no rashes, lesions, ulcers. No induration Neurologic: Sensation intact. Strength 5/5 in all 4.  Psychiatric: Normal judgment and insight. Alert and oriented x 3. Normal mood.   EKG: Ordered  Imaging on Admission: I personally reviewed and I agree with radiologist reading as below.  CT Soft Tissue Neck W Contrast  Result Date: 11/19/2021 CLINICAL DATA:  Neck mass EXAM: CT NECK WITH CONTRAST  TECHNIQUE: Multidetector CT imaging of the neck was performed using the standard protocol following the bolus administration of intravenous contrast. RADIATION DOSE REDUCTION: This exam was performed according to the departmental dose-optimization program which includes automated exposure control, adjustment of the mA and/or kV according to patient size and/or use of iterative reconstruction technique. CONTRAST:  78m OMNIPAQUE IOHEXOL 300 MG/ML  SOLN COMPARISON:  None Available. FINDINGS: Pharynx and larynx: Mucosal mass involving the right supraglottic airway extending into the airway and causing significant airway narrowing. Mass is irregular and extends down to the vocal cords and could involve the right vocal cord. Epicenter is above the vocal cord. Mass extends up to the right lateral oropharynx. Salivary glands: No inflammation, mass, or stone. Right submandibular gland surrounded by malignant lymph nodes. Thyroid: Negative Lymph nodes: Malignant adenopathy in neck bilaterally right greater than left. Large conglomeration of lymph nodes throughout the right neck, many of which have necrosis. Right submandibular necrotic nodes measuring 15 mm and 14 mm. Right level 2 lymph nodes 29  mm and 20 mm. Right level 3 lymph nodes 15 mm and 21 mm and 17 mm. Right level 4 lymph nodes 17 mm and 28 mm. Submental lymph node to the right of midline 17 mm. Small necrotic left level 2 lymph node 9 mm. Posterior lymph nodes on the left measuring 14 mm and 13 mm. Additional smaller lymph nodes on the left. Vascular: Right jugular vein is compressed by the large lymph node mass on the right. No thrombus. Left jugular vein narrowed but patent. Limited intracranial: Negative Visualized orbits: Negative Mastoids and visualized paranasal sinuses: Mucosal edema paranasal sinuses. No air-fluid level. Mastoid clear. Skeleton: No acute abnormality. Upper chest: 5 mm nodule in the superior segment of the right lower lobe. 3 mm nodule left  upper lobe. Other: None IMPRESSION: 1. 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 Mass extends down to the vocal cords and could involve the right vocal cord. Mass extends up to the right lateral oropharynx. 2. Malignant adenopathy in the neck bilaterally right greater than left. Largest lymph node mass is in the right neck measuring 29 mm and 20 mm. 3. 5 mm nodule superior segment right lower lobe and 3 mm nodule left upper lobe. Metastatic disease is possible. Chest CT recommended. 4. Right jugular vein is compressed by the large lymph node mass on the right. No thrombus. Left jugular vein narrowed but patent. 5. These results were called by telephone at the time of interpretation on 11/13/2021 at 3:49 pm to provider [facility], who verbally acknowledged these results. Electronically Signed   By: Franchot Gallo M.D.   On: 12/06/2021 15:50    Labs on Admission: I have personally reviewed following labs  CBC: Recent Labs  Lab 11/26/2021 1422  WBC 6.5  NEUTROABS 4.4  HGB 12.6*  HCT 38.8*  MCV 102.6*  PLT 55*   Basic Metabolic Panel: Recent Labs  Lab 11/20/2021 1422  NA 144  K 5.5*  CL 106  CO2 29  GLUCOSE 111*  BUN 22*  CREATININE 1.40*  CALCIUM 12.2*  MG 2.6*   GFR: Estimated Creatinine Clearance: 77.3 mL/min (A) (by C-G formula based on SCr of 1.4 mg/dL (H)).  Liver Function Tests: Recent Labs  Lab 11/20/2021 1422  AST 68*  ALT 30  ALKPHOS 89  BILITOT 4.4*  PROT 6.1*  ALBUMIN 2.1*   Recent Labs  Lab 11/22/2021 1422  AMMONIA 48*   Coagulation Profile: Recent Labs  Lab 11/15/2021 1422  INR 2.3*   Urine analysis:    Component Value Date/Time   COLORURINE YELLOW (A) 08/30/2020 1428   APPEARANCEUR HAZY (A) 08/30/2020 1428   LABSPEC 1.016 08/30/2020 1428   PHURINE 5.0 08/30/2020 1428   GLUCOSEU NEGATIVE 08/30/2020 1428   HGBUR LARGE (A) 08/30/2020 1428   BILIRUBINUR NEGATIVE 08/30/2020 [place] 08/30/2020 1428   PROTEINUR 30 (A) 08/30/2020 1428   NITRITE NEGATIVE 08/30/2020 1428   LEUKOCYTESUR NEGATIVE 08/30/2020 1428   CRITICAL CARE Performed by: Dr. Tobie Poet   Total critical care time: 35 minutes  Critical care time was exclusive of separately billable procedures and treating other patients.  Critical care was necessary to treat or prevent imminent or life-threatening deterioration.  Critical care was time spent personally by me on the following activities: development of treatment plan with patient and/or surrogate as well as nursing, discussions with consultants, evaluation of patient's response to treatment, examination of patient, obtaining history from patient or surrogate,  ordering and performing treatments and interventions, ordering and review of laboratory studies, ordering and review of radiographic studies, pulse oximetry and re-evaluation of patient's condition.  Dr. Tobie Poet [facility]  If 7PM-7AM, please contact overnight-coverage provider If 7AM-7PM, please contact day coverage provider [email or web address]  11/11/2021, 7:24 PM

## 2021-11-23 NOTE — ED Triage Notes (Signed)
Pt has abscess in mouth and side of throat. Was going to go to his follow-up appointment but had projectile vomiting with estimated [phone] mL of blood with one clot per EMS. Pt reports having missed 2-3 days of home medications and have been unable to eat or drink due to abscess.

## 2021-11-23 NOTE — Consult Note (Signed)
Jordan Hayden, Jordan Hayden [id number] 09-30-1965 Cox, Amy N, DO  Reason for Consult: Right sided neck mass  HPI: 56 year old male with history of cirrhosis of liver and thrombocytopenia as well as enlarging right sided neck mass.  Patient reports throat pain worsening over last several days although on talking to him he reports it was going on longer than that.  Decreased PO intake following recent oral surgery.  Per Oncology, right sided lymph nodes have been present for some time.  Patient underwent CT scan with concern for supraglottic mass with airway narrowing as well as necrotic lymph nodes more consistent with carcinoma.  Patient denies any shortness of breath.  Reports he had some hematemesis today and a nose bleed.  Allergies:  Allergies  Allergen Reactions   Hydrocodone-Acetaminophen Swelling    Mouth Swelling    Penicillins Swelling    ROS: Review of systems normal other than 12 systems except per HPI.  PMH:  Past Medical History:  Diagnosis Date   Alcohol abuse    Alcoholic cirrhosis ([place])    Alcoholism ([place])    Arthritis    Cirrhosis ([place])     FH:  Family History  Problem Relation Age of Onset   Hypertension Mother    Diabetes Father    Cervical cancer Sister    Cirrhosis Brother     SH:  Social History   Socioeconomic History   Marital status: Single    Spouse name: Not on file   Number of children: 1   Years of education: Not on file   Highest education level: Not on file  Occupational History   Not on file  Tobacco Use   Smoking status: Never   Smokeless tobacco: Never  Vaping Use   Vaping Use: Never used  Substance and Sexual Activity   Alcohol use: No    Comment: quit 2 years    Drug use: No   Sexual activity: Not on file  Other Topics Concern   Not on file  Social History Narrative   Not on file   Social Determinants of Health   Financial Resource Strain: Not on file  Food Insecurity: Not on file  Transportation Needs: Not on file   Physical Activity: Not on file  Stress: Not on file  Social Connections: Not on file  Intimate Partner Violence: Not on file    PSH:  Past Surgical History:  Procedure Laterality Date   ESOPHAGOGASTRODUODENOSCOPY (EGD) WITH PROPOFOL N/A 07/01/2017   Procedure: ESOPHAGOGASTRODUODENOSCOPY (EGD) WITH PROPOFOL;  Surgeon: Lucilla Lame, MD;  Location: [facility] ENDOSCOPY;  Service: Endoscopy;  Laterality: N/A;    Physical  Exam:  GEN-  supine in bed in NAD RESP-  no stridor or sturtor, voice raspy with mild wet quality NEURO-CN 2-12 grossly intact EARS- clear externally OC/OP- no masses or lesions noted no blood NECK-  Right sided large matted conglomeration of lymph nodes firm and fixed.  Submental single lymph node CARD-  RRR  Procedure:  Trans-nasal flexible laryngoscopy.  Pre-procedure diagnosis:  Dysphonia, supraglottic mass.  Post-procedure diagnosis:  same.  Description of procedure:  After verbal consent was obtained, the flexible laryngoscope was inserted into the patient's right nasal cavity.  This was advanced for visualization of the patient's nasal cavity and nasopharynx, pharynx, and larynx.  This demonstrated a right sided laryngeal mass extending from epiglottis down to involve the right larynx with displacement of the larynx to the patient's left side.  Left vocal fold is mobile.  The mass is covered  in old blood clot.  Posterior airway is visible but narrowed.  CT-   Large mucosal mass in the right supraglottic airway extending into the airway causing significant airway narrowing. Mass extends down to the vocal cords and could involve the right vocal cord. Mass extends up to the right lateral oropharynx. 2. Malignant adenopathy in the neck bilaterally right greater than left. Largest lymph node mass is in the right neck measuring 29 mm and 20 mm. 3. 5 mm nodule superior segment right lower lobe and 3 mm nodule left upper lobe. Metastatic disease is possible. Chest  CT recommended. 4. Right jugular vein is compressed by the large lymph node mass on the right. No thrombus. Left jugular vein narrowed but patent.   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  Plan:  Discussed findings with patient and girlfriend.  Patient with minimal breathing symptoms right now but on CT scan and laryngoscopy, airway is significantly narrowed.  Recommend awake tracheostomy and given limited symptoms will plan to do this tomorrow at noon.  Would like to have platelets above 100 if possible as he is going to be at high risk of bleeding.  Will also plan on proceeding with laryngoscopy with biopsy to determine what the mass is following tracheostomy.  Consent obtained.  Given recent worsening after dental procedure, if OK with Medicine recommend trial of IV steroids to see if some of this is post-procedural edema as well.   Jeannie Fend Perl Folmar 11/13/2021 5:40 PM

## 2021-11-23 NOTE — Assessment & Plan Note (Signed)
-   Repeat LFTs in the a.m. - Presumed secondary to inability of patient to tolerate p.o. intake therefore has not been taking his spironolactone, lactulose, rifaximin

## 2021-11-23 NOTE — ED Provider Notes (Signed)
[facility] Provider Note    Event Date/Time   First MD Initiated Contact with Patient 12/02/2021 1426     (approximate)   History   Chief Complaint: Abscess (On side of mouth and throat)   HPI  Jordan Hayden is a 56 y.o. male with history of cirrhosis, thrombocytopenia, morbid obesity who comes to the ED complaining of sore throat causing him to have decreased oral intake for the past 2 days, unable to take his medications including lactulose, rifaximin, spironolactone.  Today he had an episode of hematemesis.  He denies black or bloody stool, no diarrhea.     Physical Exam   Triage Vital Signs: ED Triage Vitals  Enc Vitals Group     BP 12/05/2021 1414 107/76     Pulse Rate 12/05/2021 1414 98     Resp --      Temp 11/10/2021 1414 98.3 F (36.8 C)     Temp Source 12/07/2021 1414 Axillary     SpO2 11/27/2021 1414 99 %     Weight 11/21/2021 1416 273 lb 1.6 oz (123.9 kg)     Height 11/28/2021 1416 '5\' 8"'$  (1.727 m)     Head Circumference --      Peak Flow --      Pain Score 11/26/2021 1416 0     Pain Loc --      Pain Edu? --      Excl. in [place]? --     Most recent vital signs: Vitals:   11/17/2021 1414  BP: 107/76  Pulse: 98  Temp: 98.3 F (36.8 C)  SpO2: 99%    General: Awake, no distress.  Clear voice CV:  Good peripheral perfusion.  Regular rate and rhythm Resp:  Normal effort.  Clear to auscultation, no wheezes or stridor. Abd:  No distention.  Soft and nontender Other:  .  Right anterior neck has area of firmness swelling and tenderness concerning for abscess.  The swelling extends around to the submental space.  Trachea is midline. Patient has large tongue and some trismus, limiting visualization of the oropharynx, but it appears symmetric with normal space between the tonsils.  There is a trace amount of fresh red blood present in the mouth without active intraoral bleeding.   ED Results / Procedures / Treatments   Labs (all labs ordered are  listed, but only abnormal results are displayed) Labs Reviewed  COMPREHENSIVE METABOLIC PANEL - Abnormal; Notable for the following components:      Result Value   Potassium 5.5 (*)    Glucose, Bld 111 (*)    BUN 22 (*)    Creatinine, Ser 1.40 (*)    Calcium 12.2 (*)    Total Protein 6.1 (*)    Albumin 2.1 (*)    AST 68 (*)    Total Bilirubin 4.4 (*)    GFR, Estimated 60 (*)    All other components within normal limits  CBC WITH DIFFERENTIAL/PLATELET - Abnormal; Notable for the following components:   RBC 3.78 (*)    Hemoglobin 12.6 (*)    HCT 38.8 (*)    MCV 102.6 (*)    RDW 17.5 (*)    Platelets 55 (*)    nRBC 0.5 (*)    All other components within normal limits  PROTIME-INR - Abnormal; Notable for the following components:   Prothrombin Time 25.3 (*)    INR 2.3 (*)    All other components within normal limits  AMMONIA - Abnormal; Notable  for the following components:   Ammonia 48 (*)    All other components within normal limits  MAGNESIUM - Abnormal; Notable for the following components:   Magnesium 2.6 (*)    All other components within normal limits  TYPE AND SCREEN  TYPE AND SCREEN     EKG    RADIOLOGY CT neck interpreted by me, shows a cutaneous abscess in the right submandibular space.  There are enlarged lymph nodes including in the submental space explaining the swelling of that area.  No airway.  Tonsils are large.  Radiology report reviewed.   PROCEDURES:  Procedures   MEDICATIONS ORDERED IN ED: Medications  cefTRIAXone (ROCEPHIN) 2 g in sodium chloride 0.9 % 100 mL IVPB (2 g Intravenous New Bag/Given 11/20/2021 1453)    And  metroNIDAZOLE (FLAGYL) IVPB 500 mg (has no administration in time range)  pantoprazole (PROTONIX) injection 40 mg (40 mg Intravenous Given 11/12/2021 1448)  iohexol (OMNIPAQUE) 300 MG/ML solution 75 mL (75 mLs Intravenous Contrast Given 11/29/2021 1525)     IMPRESSION / MDM / ASSESSMENT AND PLAN / ED COURSE  I reviewed the  triage vital signs and the nursing notes.                              Differential diagnosis includes, but is not limited to, peritonsillar abscess, neck soft tissue abscess, Ludewig's angina, coagulopathy, thrombocytopenia, upper GI bleed, decompensated cirrhosis.  Patient's presentation is most consistent with acute presentation with potential threat to life or bodily function.  Patient presents with neck swelling suspicious for soft tissue abscess.  Airway is patent.  Also had an episode of hematemesis at home associated with cirrhosis which is fairly advanced with his albumin of 2.1 and INR of 2.3.  Protonix given.  Antibiotics ordered.  Awaiting results of CT neck.  Plan for ENT and GI evaluation and hospital admission.       FINAL CLINICAL IMPRESSION(S) / ED DIAGNOSES   Final diagnoses:  Neck abscess  AKI (acute kidney injury) ([place])  Cirrhosis of liver without ascites, unspecified hepatic cirrhosis type ([place])  UGIB (upper gastrointestinal bleed)     Rx / DC Orders   ED Discharge Orders     None        Note:  This document was prepared using Dragon voice recognition software and may include unintentional dictation errors.   Carrie Mew, MD 11/16/2021 1538

## 2021-11-23 NOTE — Assessment & Plan Note (Signed)
-   Currently holding home lactulose, Xifaxan, spironolactone due to strict n.p.o.  - Continue to monitor, if needed, lactulose can be given rectally

## 2021-11-23 NOTE — Assessment & Plan Note (Addendum)
-   Goal platelet is greater than 100 - Hematology/oncology has been consulted - Oncology service will order fibrinogen, cryoprecipitate, FFP - CBC every 4 hours

## 2021-11-23 NOTE — Consult Note (Signed)
GI Inpatient Consult Note  Reason for Consult: Neck mass, cirrhosis    Attending Requesting Consult: Dr. Rupert Stacks  History of Present Illness: Jordan Hayden is a 56 y.o. male seen for evaluation of neck mass and advanced cirrhosis of the liver at the request of admitting hospitalist - Dr. Rupert Stacks. 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 He is well known to me as an outpatient as he follows with me for alcoholic cirrhosis of the liver. He presented to the [facility] ED via EMS for chief complaint of hematemesis of [phone] mL of blood with clots per EMS. He had a nose bleed and 3 episodes of spitting up blood this morning starting around 11 AM. He had missed 2-3 days of his home medications as well. He had a tooth extracted by an oral surgeon in [place], [place] earlier this week and reports that basically ever since this procedure he has had a lot of right-sided neck swelling, sore throat, hoarseness, and inability to tolerate oral intake. He was seen at the [facility] walk-in-clinic back in July of this year for neck swelling not responding to oral antibiotics and was advised to have CT scan but unclear why this was never done. His oral surgeon told him that he had a lot of swelling from his procedure and thought he might have an abscess. Upon presentation to the ED this afternoon, vital signs were stable. Labs were significant for hemoglobin 12.6, MCV 102.6, WBC 6.5K, platelets 55K potassium 5.5, serum creatinine 1.40, BUN 22, albumin 2.1, AST 68, ALT 30, total bilirubin 4.4, INR 2.3, and ammonia 48. A CT soft tissue neck was performed due to neck swelling which commented on presence of large mucosal mass in right supraglottic airway extending into the airway causing significant airway narrowing. There was also note of malignant adenopathy in neck bilaterally right > left, possible  metastatic lung nodes, and right jugular vein compressed by large lymph node without thrombus. ENT was consulted as well as GI for further evaluation and management.   Past Medical History:  Past Medical History:  Diagnosis Date   Alcohol abuse    Alcoholic cirrhosis ([place])    Alcoholism ([place])    Arthritis    Cirrhosis ([place])     Problem List: Patient Active Problem List   Diagnosis Date Noted   Neck mass 11/17/2021   Hepatic encephalopathy ([place]) 08/30/2020   Sacroiliitis ([place]) 09/30/2018   Bacteremia due to Gram-negative bacteria 09/12/2018   Cellulitis 06/16/2018   Morbid obesity with BMI of 40.0-44.9, adult ([place]) 02/12/2018   Sepsis ([place]) 06/28/2017   Iron deficiency anemia 05/19/2017   Thrombocytopenia ([place]) 04/01/2017   Anasarca 02/29/2016   Advanced cirrhosis of liver ([place]) 02/17/2016   Dupuytren's contracture of right hand 02/17/2016   Hyperbilirubinemia 02/17/2016    Past Surgical History: Past Surgical History:  Procedure Laterality Date   ESOPHAGOGASTRODUODENOSCOPY (EGD) WITH PROPOFOL N/A 07/01/2017   Procedure: ESOPHAGOGASTRODUODENOSCOPY (EGD) WITH PROPOFOL;  Surgeon: Lucilla Lame, MD;  Location: [facility] ENDOSCOPY;  Service: Endoscopy;  Laterality: N/A;    Allergies: Allergies  Allergen Reactions   Hydrocodone-Acetaminophen Swelling    Mouth Swelling    Penicillins Swelling    Home Medications: (Not in a hospital admission)  Home medication reconciliation was completed with the patient.   Scheduled Inpatient Medications:    sodium chloride   Intravenous Once  octreotide  50 mcg Intravenous Once   [START ON 11/27/2021] pantoprazole  40 mg Intravenous Q12H    Continuous Inpatient Infusions:    cefTRIAXone (ROCEPHIN)  IV Stopped (12/02/2021 1545)   And   metronidazole Stopped (12/05/2021 1620)   octreotide (SANDOSTATIN) 500 mcg in sodium chloride 0.9 % 250 mL (2 mcg/mL) infusion     pantoprazole     pantoprazole      PRN Inpatient Medications:   acetaminophen **OR** acetaminophen, ondansetron **OR** ondansetron (ZOFRAN) IV  Family History: family history includes Cervical cancer in his sister; Cirrhosis in his brother; Diabetes in his father; Hypertension in his mother.  The patient's family history is negative for inflammatory bowel disorders, GI malignancy, or solid organ transplantation.  Social History:   reports that he has never smoked. He has never used smokeless tobacco. He reports that he does not drink alcohol and does not use drugs. The patient denies ETOH, tobacco, or drug use.   Review of Systems: Constitutional: Weight is stable.  Eyes: No changes in vision. ENT: No oral lesions, sore throat.  GI: see HPI.  Heme/Lymph: No easy bruising.  CV: No chest pain.  GU: No hematuria.  Integumentary: No rashes.  Neuro: No headaches.  Psych: No depression/anxiety.  Endocrine: No heat/cold intolerance.  Allergic/Immunologic: No urticaria.  Resp: No cough, SOB.  Musculoskeletal: No joint swelling.    Physical Examination: BP 107/76 (BP Location: Right Arm)   Pulse 98   Temp 98.3 F (36.8 C) (Axillary)   Ht '5\' 8"'$  (1.727 m)   Wt 123.9 kg   SpO2 99%   BMI 41.52 kg/m  Gen: NAD, alert and oriented x 4 HEENT: PEERLA, EOMI, +scleral icterus  Neck: right anterior neck with swelling with palpable mass/lymph node, trachea midline Chest: CTA bilaterally, no wheezes, crackles, or other adventitious sounds CV: RRR, no m/g/c/r Abd: soft, NT, ND, +BS in all four quadrants; no HSM, guarding, ridigity, or rebound tenderness Ext: no edema, well perfused with 2+ pulses, Skin: no rash or lesions noted Lymph: no LAD  Data: Lab Results  Component Value Date   WBC 6.5 12/02/2021   HGB 12.6 (L) 11/26/2021   HCT 38.8 (L) 11/26/2021   MCV 102.6 (H) 12/06/2021   PLT 55 (L) 12/02/2021   Recent Labs  Lab 11/19/2021 1422  HGB 12.6*   Lab Results  Component Value Date   NA 144 11/20/2021   K 5.5 (H) 11/28/2021   CL 106  11/22/2021   CO2 29 11/22/2021   BUN 22 (H) 11/29/2021   CREATININE 1.40 (H) 11/25/2021   Lab Results  Component Value Date   ALT 30 11/12/2021   AST 68 (H) 12/01/2021   ALKPHOS 89 11/29/2021   BILITOT 4.4 (H) 11/21/2021   Recent Labs  Lab 11/10/2021 1422  INR 2.3*   CT Soft Tissue Neck 11/17/2021: IMPRESSION: 1. 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 Mass extends down to the vocal cords and could involve the right vocal cord. Mass extends up to the right lateral oropharynx. 2. Malignant adenopathy in the neck bilaterally right greater than left. Largest lymph node mass is in the right neck measuring 29 mm and 20 mm. 3. 5 mm nodule superior segment right lower lobe and 3 mm nodule left upper lobe. Metastatic disease is possible. Chest CT recommended. 4. Right jugular vein is compressed by the large lymph node mass on the right. No thrombus. Left jugular vein narrowed but  patent. 5. These results were called by telephone at the time of interpretation on 11/12/2021 at 3:49 pm to provider [facility], who verbally acknowledged these results.  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 Pryor Ochoa of ENT and he performed bedside  nasolaryngoscopy which revealed bleeding arising from the neck mass. I suspect his blood loss has been coming solely from the neck mass compromising his airway and not from any UGI pathology.  - OK to continue PPI, antibiotics, octreotide for gastric protection - No plans for endoscopy at present time. An endoscope would not be able to be safely bypass the mass.  - Agree with ENT plans for awake tracheostomy planned for tomorrow as well as biopsy to confirm diagnosis  - Recommend Oncology consult given findings concerning for malignancy  - Continue home medications of Lactulose 30 mL BID titrated to 2-4 soft bowel movements daily and Rifaximin 550 mg PO BID - OK to hold diuretics for now - NPO except for sips with meds - Goal platelets >100K tomorrow in anticipation for procedure - Continue supportive care per primary team - Serial examinations - GI following along with you. Call Dr. Virgina Jock if there are any concerns or questions.   Thank you for the consult. Please call with questions or concerns.  Geanie Kenning, PA-C [facility] [phone] (Office)

## 2021-11-23 NOTE — Assessment & Plan Note (Signed)
-   This complicates overall care and prognosis.  

## 2021-11-23 NOTE — Assessment & Plan Note (Addendum)
-   Meropenem, Flagyl - Decadron 10 mg IV one-time dose ordered on admission and 10 mg IV, 1 time a dose ordered for 11/12/2021 - ENT has been consulted and patient will be taken to the OR on 11/15/2021 - Dr. Pryor Ochoa requesting stepdown bed on admission and ICU care postop - Patient is currently stable at bedside, and should he decompensate, ENT specialist should be called emergently overnight, discussed with cross coverage provider - Admit to inpatient, stepdown

## 2021-11-23 NOTE — ED Provider Notes (Signed)
-----------------------------------------   5:00 PM on 11/25/2021 -----------------------------------------   CT shows large mass concerning for malignancy.  On my reassessment the patient reports that the symptoms have been developing gradually.  His voice is hoarse but he states this is not an acute change.  He has not had any further vomiting or hematemesis while in the ED.  He remains hemodynamically stable and is maintaining his airway.  I consulted Dr. Pryor Ochoa from ENT and discussed the case with him.  He will evaluate the patient.  I have also consulted Dr. Tobie Poet from the hospitalist service; based on our discussion she agrees to admit the patient and will consult GI.   Arta Silence, MD 11/25/2021 [phone]

## 2021-11-24 ENCOUNTER — Inpatient Hospital Stay: Payer: Medicare Other | Admitting: Certified Registered Nurse Anesthetist

## 2021-11-24 ENCOUNTER — Other Ambulatory Visit: Payer: Self-pay

## 2021-11-24 ENCOUNTER — Encounter
Admission: EM | Disposition: E | Payer: Self-pay | Source: Home / Self Care | Attending: Student in an Organized Health Care Education/Training Program

## 2021-11-24 DIAGNOSIS — R791 Abnormal coagulation profile: Secondary | ICD-10-CM | POA: Diagnosis not present

## 2021-11-24 DIAGNOSIS — K7460 Unspecified cirrhosis of liver: Secondary | ICD-10-CM

## 2021-11-24 DIAGNOSIS — R042 Hemoptysis: Secondary | ICD-10-CM

## 2021-11-24 DIAGNOSIS — D696 Thrombocytopenia, unspecified: Secondary | ICD-10-CM

## 2021-11-24 DIAGNOSIS — K922 Gastrointestinal hemorrhage, unspecified: Secondary | ICD-10-CM

## 2021-11-24 DIAGNOSIS — R221 Localized swelling, mass and lump, neck: Secondary | ICD-10-CM | POA: Diagnosis not present

## 2021-11-24 DIAGNOSIS — N179 Acute kidney failure, unspecified: Secondary | ICD-10-CM | POA: Diagnosis not present

## 2021-11-24 HISTORY — PX: DIRECT LARYNGOSCOPY: SHX5326

## 2021-11-24 HISTORY — PX: TRACHEOSTOMY TUBE PLACEMENT: SHX814

## 2021-11-24 LAB — CBC
HCT: 37.2 % — ABNORMAL LOW (ref 39.0–52.0)
HCT: 34 % — ABNORMAL LOW (ref 39.0–52.0)
HCT: 27.2 % — ABNORMAL LOW (ref 39.0–52.0)
HCT: 20.7 % — ABNORMAL LOW (ref 39.0–52.0)
Hemoglobin: 12.4 g/dL — ABNORMAL LOW (ref 13.0–17.0)
Hemoglobin: 11.2 g/dL — ABNORMAL LOW (ref 13.0–17.0)
Hemoglobin: 9.1 g/dL — ABNORMAL LOW (ref 13.0–17.0)
Hemoglobin: 6.9 g/dL — ABNORMAL LOW (ref 13.0–17.0)
MCH: 34.1 pg — ABNORMAL HIGH (ref 26.0–34.0)
MCH: 33.7 pg (ref 26.0–34.0)
MCH: 34 pg (ref 26.0–34.0)
MCH: 33.8 pg (ref 26.0–34.0)
MCHC: 33.3 g/dL (ref 30.0–36.0)
MCHC: 32.9 g/dL (ref 30.0–36.0)
MCHC: 33.5 g/dL (ref 30.0–36.0)
MCHC: 33.3 g/dL (ref 30.0–36.0)
MCV: 102.2 fL — ABNORMAL HIGH (ref 80.0–100.0)
MCV: 102.4 fL — ABNORMAL HIGH (ref 80.0–100.0)
MCV: 101.5 fL — ABNORMAL HIGH (ref 80.0–100.0)
MCV: 101.5 fL — ABNORMAL HIGH (ref 80.0–100.0)
Platelets: 78 10*3/uL — ABNORMAL LOW (ref 150–400)
Platelets: 57 10*3/uL — ABNORMAL LOW (ref 150–400)
Platelets: 45 10*3/uL — ABNORMAL LOW (ref 150–400)
Platelets: 39 10*3/uL — ABNORMAL LOW (ref 150–400)
RBC: 3.64 MIL/uL — ABNORMAL LOW (ref 4.22–5.81)
RBC: 3.32 MIL/uL — ABNORMAL LOW (ref 4.22–5.81)
RBC: 2.68 MIL/uL — ABNORMAL LOW (ref 4.22–5.81)
RBC: 2.04 MIL/uL — ABNORMAL LOW (ref 4.22–5.81)
RDW: 17.6 % — ABNORMAL HIGH (ref 11.5–15.5)
RDW: 17.8 % — ABNORMAL HIGH (ref 11.5–15.5)
RDW: 17.8 % — ABNORMAL HIGH (ref 11.5–15.5)
RDW: 17.9 % — ABNORMAL HIGH (ref 11.5–15.5)
WBC: 12.8 10*3/uL — ABNORMAL HIGH (ref 4.0–10.5)
WBC: 10.9 10*3/uL — ABNORMAL HIGH (ref 4.0–10.5)
WBC: 9.6 10*3/uL (ref 4.0–10.5)
WBC: 5.8 10*3/uL (ref 4.0–10.5)
nRBC: 0.5 % — ABNORMAL HIGH (ref 0.0–0.2)
nRBC: 0.4 % — ABNORMAL HIGH (ref 0.0–0.2)
nRBC: 0.4 % — ABNORMAL HIGH (ref 0.0–0.2)
nRBC: 0.5 % — ABNORMAL HIGH (ref 0.0–0.2)

## 2021-11-24 LAB — SAMPLE TO BLOOD BANK

## 2021-11-24 LAB — BASIC METABOLIC PANEL
Anion gap: 12 (ref 5–15)
BUN: 32 mg/dL — ABNORMAL HIGH (ref 6–20)
CO2: 29 mmol/L (ref 22–32)
Calcium: 12.9 mg/dL — ABNORMAL HIGH (ref 8.9–10.3)
Chloride: 103 mmol/L (ref 98–111)
Creatinine, Ser: 2.29 mg/dL — ABNORMAL HIGH (ref 0.61–1.24)
GFR, Estimated: 33 mL/min — ABNORMAL LOW (ref >60)
Glucose, Bld: 144 mg/dL — ABNORMAL HIGH (ref 70–99)
Potassium: 5.1 mmol/L (ref 3.5–5.1)
Sodium: 144 mmol/L (ref 135–145)

## 2021-11-24 LAB — BPAM PLATELET PHERESIS
Blood Product Expiration Date: 202311162359
ISSUE DATE / TIME: 202311161820
Unit Type and Rh: 7300

## 2021-11-24 LAB — HEPATIC FUNCTION PANEL
ALT: 34 U/L (ref 0–44)
AST: 80 U/L — ABNORMAL HIGH (ref 15–41)
Albumin: 2.3 g/dL — ABNORMAL LOW (ref 3.5–5.0)
Alkaline Phosphatase: 104 U/L (ref 38–126)
Bilirubin, Direct: 2.3 mg/dL — ABNORMAL HIGH (ref 0.0–0.2)
Indirect Bilirubin: 2.6 mg/dL — ABNORMAL HIGH (ref 0.3–0.9)
Total Bilirubin: 4.9 mg/dL — ABNORMAL HIGH (ref 0.3–1.2)
Total Protein: 6.8 g/dL (ref 6.5–8.1)

## 2021-11-24 LAB — AMMONIA: Ammonia: 33 umol/L (ref 9–35)

## 2021-11-24 LAB — PREPARE PLATELET PHERESIS: Unit division: 0

## 2021-11-24 LAB — FIBRINOGEN: Fibrinogen: 193 mg/dL — ABNORMAL LOW (ref 210–475)

## 2021-11-24 LAB — PROTIME-INR
INR: 2.3 — ABNORMAL HIGH (ref 0.8–1.2)
INR: 3 — ABNORMAL HIGH (ref 0.8–1.2)
INR: 2 — ABNORMAL HIGH (ref 0.8–1.2)
Prothrombin Time: 25 seconds — ABNORMAL HIGH (ref 11.4–15.2)
Prothrombin Time: 30.8 seconds — ABNORMAL HIGH (ref 11.4–15.2)
Prothrombin Time: 22.3 seconds — ABNORMAL HIGH (ref 11.4–15.2)

## 2021-11-24 LAB — APTT
aPTT: 41 seconds — ABNORMAL HIGH (ref 24–36)
aPTT: 37 seconds — ABNORMAL HIGH (ref 24–36)

## 2021-11-24 LAB — CREATININE, URINE, RANDOM: Creatinine, Urine: 244 mg/dL

## 2021-11-24 LAB — SODIUM, URINE, RANDOM: Sodium, Ur: 43 mmol/L

## 2021-11-24 LAB — GLUCOSE, CAPILLARY: Glucose-Capillary: 201 mg/dL — ABNORMAL HIGH (ref 70–99)

## 2021-11-24 SURGERY — CREATION, TRACHEOSTOMY
Anesthesia: Monitor Anesthesia Care

## 2021-11-24 MED ORDER — SODIUM CHLORIDE 0.9 % IV SOLN: INTRAVENOUS | Status: DC | PRN

## 2021-11-24 MED ORDER — DEXMEDETOMIDINE HCL IN NACL 200 MCG/50ML IV SOLN
INTRAVENOUS | Status: DC | PRN
  Administered 2021-11-24: .7 ug/kg/h via INTRAVENOUS
  Administered 2021-11-24: 32 ug via INTRAVENOUS

## 2021-11-24 MED ORDER — HEPARIN SOD (PORK) LOCK FLUSH 100 UNIT/ML IV SOLN
500.0000 [IU] | Freq: Every day | INTRAVENOUS | Status: DC | PRN
  Filled 2021-11-24: qty 5

## 2021-11-24 MED ORDER — SODIUM CHLORIDE 0.9% IV SOLUTION: Freq: Once | INTRAVENOUS | Status: AC

## 2021-11-24 MED ORDER — ALBUMIN HUMAN 25 % IV SOLN
100.0000 g | Freq: Every day | INTRAVENOUS | Status: AC
  Administered 2021-11-24 – 2021-11-25 (×2): 100 g via INTRAVENOUS
  Filled 2021-11-24 (×2): qty 400

## 2021-11-24 MED ORDER — LIDOCAINE-EPINEPHRINE 1 %-1:100000 IJ SOLN
INTRAMUSCULAR | Status: AC
  Filled 2021-11-24: qty 1

## 2021-11-24 MED ORDER — SODIUM CHLORIDE 0.9 % IV SOLN
2.0000 g | INTRAVENOUS | Status: DC
  Administered 2021-11-24 – 2021-11-27 (×4): 2 g via INTRAVENOUS
  Filled 2021-11-24: qty 20
  Filled 2021-11-24: qty 2
  Filled 2021-11-24 (×3): qty 20
  Filled 2021-11-24: qty 2

## 2021-11-24 MED ORDER — SODIUM CHLORIDE 0.9 % IV SOLN: 1.0000 g | Freq: Two times a day (BID) | INTRAVENOUS | Status: DC

## 2021-11-24 MED ORDER — SODIUM CHLORIDE 0.9% FLUSH: 10.0000 mL | INTRAVENOUS | Status: DC | PRN

## 2021-11-24 MED ORDER — MIDAZOLAM HCL 2 MG/2ML IJ SOLN
2.0000 mg | Freq: Once | INTRAMUSCULAR | Status: AC
  Administered 2021-11-24: 2 mg via INTRAVENOUS

## 2021-11-24 MED ORDER — KETAMINE HCL 50 MG/5ML IJ SOSY
PREFILLED_SYRINGE | INTRAMUSCULAR | Status: AC
  Filled 2021-11-24: qty 5

## 2021-11-24 MED ORDER — KETAMINE HCL 10 MG/ML IJ SOLN
INTRAMUSCULAR | Status: DC | PRN
  Administered 2021-11-24: 10 mg via INTRAVENOUS
  Administered 2021-11-24 (×2): 20 mg via INTRAVENOUS

## 2021-11-24 MED ORDER — OXYMETAZOLINE HCL 0.05 % NA SOLN
NASAL | Status: AC
  Filled 2021-11-24: qty 30

## 2021-11-24 MED ORDER — LIDOCAINE-EPINEPHRINE 1 %-1:100000 IJ SOLN
INTRAMUSCULAR | Status: DC | PRN
  Administered 2021-11-24: 6 mL

## 2021-11-24 MED ORDER — PROPOFOL 500 MG/50ML IV EMUL
INTRAVENOUS | Status: DC | PRN
  Administered 2021-11-24: 100 mg via INTRAVENOUS

## 2021-11-24 MED ORDER — ORAL CARE MOUTH RINSE
15.0000 mL | OROMUCOSAL | Status: DC
  Administered 2021-11-24 – 2021-11-29 (×53): 15 mL via OROMUCOSAL

## 2021-11-24 MED ORDER — PROPOFOL 1000 MG/100ML IV EMUL
INTRAVENOUS | Status: AC
  Administered 2021-11-24: 30 ug/kg/min via INTRAVENOUS
  Filled 2021-11-24: qty 100

## 2021-11-24 MED ORDER — 0.9 % SODIUM CHLORIDE (POUR BTL) OPTIME
TOPICAL | Status: DC | PRN
  Administered 2021-11-24: 500 mL

## 2021-11-24 MED ORDER — PHYTONADIONE 1 MG/0.5 ML ORAL SOLUTION: 10.0000 mg | Freq: Once | ORAL | Status: DC

## 2021-11-24 MED ORDER — ONDANSETRON HCL 4 MG/2ML IJ SOLN
INTRAMUSCULAR | Status: DC | PRN
  Administered 2021-11-24: 4 mg via INTRAVENOUS

## 2021-11-24 MED ORDER — NOREPINEPHRINE 16 MG/250ML-% IV SOLN
0.0000 ug/min | INTRAVENOUS | Status: DC
  Administered 2021-11-24: 2 ug/min via INTRAVENOUS
  Filled 2021-11-24: qty 250

## 2021-11-24 MED ORDER — INSULIN ASPART 100 UNIT/ML IJ SOLN
0.0000 [IU] | INTRAMUSCULAR | Status: DC
  Administered 2021-11-24: 5 [IU] via SUBCUTANEOUS
  Administered 2021-11-25 (×2): 2 [IU] via SUBCUTANEOUS
  Administered 2021-11-25: 3 [IU] via SUBCUTANEOUS
  Administered 2021-11-26 – 2021-11-27 (×9): 2 [IU] via SUBCUTANEOUS
  Administered 2021-11-27 (×2): 3 [IU] via SUBCUTANEOUS
  Administered 2021-11-27 – 2021-11-28 (×5): 2 [IU] via SUBCUTANEOUS
  Filled 2021-11-24 (×18): qty 1

## 2021-11-24 MED ORDER — DOCUSATE SODIUM 50 MG/5ML PO LIQD
100.0000 mg | Freq: Two times a day (BID) | ORAL | Status: DC
  Administered 2021-11-27 – 2021-12-02 (×2): 100 mg
  Filled 2021-11-24 (×5): qty 10

## 2021-11-24 MED ORDER — LACTATED RINGERS IV BOLUS
1000.0000 mL | Freq: Once | INTRAVENOUS | Status: AC
  Administered 2021-11-24: 1000 mL via INTRAVENOUS

## 2021-11-24 MED ORDER — MIDAZOLAM HCL 2 MG/2ML IJ SOLN
2.0000 mg | INTRAMUSCULAR | Status: DC | PRN
  Administered 2021-11-25 – 2021-11-27 (×3): 2 mg via INTRAVENOUS
  Filled 2021-11-24 (×2): qty 2

## 2021-11-24 MED ORDER — PHENYLEPHRINE 80 MCG/ML (10ML) SYRINGE FOR IV PUSH (FOR BLOOD PRESSURE SUPPORT)
PREFILLED_SYRINGE | INTRAVENOUS | Status: DC | PRN
  Administered 2021-11-24 (×2): 80 ug via INTRAVENOUS
  Administered 2021-11-24 (×2): 160 ug via INTRAVENOUS

## 2021-11-24 MED ORDER — POLYETHYLENE GLYCOL 3350 17 G PO PACK
17.0000 g | PACK | Freq: Every day | ORAL | Status: DC
  Administered 2021-12-02: 17 g
  Filled 2021-11-24 (×3): qty 1

## 2021-11-24 MED ORDER — HEPARIN SOD (PORK) LOCK FLUSH 100 UNIT/ML IV SOLN
250.0000 [IU] | INTRAVENOUS | Status: DC | PRN
  Filled 2021-11-24: qty 5

## 2021-11-24 MED ORDER — EPINEPHRINE PF 1 MG/ML IJ SOLN
INTRAMUSCULAR | Status: AC
  Filled 2021-11-24: qty 1

## 2021-11-24 MED ORDER — VITAMIN K1 10 MG/ML IJ SOLN
10.0000 mg | Freq: Once | INTRAVENOUS | Status: AC
  Administered 2021-11-24: 10 mg via INTRAVENOUS
  Filled 2021-11-24: qty 1

## 2021-11-24 MED ORDER — FENTANYL 2500MCG IN NS 250ML (10MCG/ML) PREMIX INFUSION
0.0000 ug/h | INTRAVENOUS | Status: DC
  Administered 2021-11-24: 100 ug/h via INTRAVENOUS
  Administered 2021-11-24: 50 ug/h via INTRAVENOUS
  Administered 2021-11-25 – 2021-11-26 (×2): 100 ug/h via INTRAVENOUS
  Filled 2021-11-24 (×3): qty 250

## 2021-11-24 MED ORDER — SODIUM CHLORIDE 0.9% IV SOLUTION: 250.0000 mL | Freq: Once | INTRAVENOUS | Status: DC

## 2021-11-24 MED ORDER — FAMOTIDINE 20 MG PO TABS: 20.0000 mg | ORAL_TABLET | Freq: Two times a day (BID) | ORAL | Status: DC

## 2021-11-24 MED ORDER — SODIUM CHLORIDE 0.9% FLUSH: 3.0000 mL | INTRAVENOUS | Status: DC | PRN

## 2021-11-24 MED ORDER — PROPOFOL 1000 MG/100ML IV EMUL
5.0000 ug/kg/min | INTRAVENOUS | Status: DC
  Administered 2021-11-24 – 2021-11-25 (×5): 30 ug/kg/min via INTRAVENOUS
  Filled 2021-11-24 (×7): qty 100

## 2021-11-24 MED ORDER — SODIUM CHLORIDE 0.9% IV SOLUTION
250.0000 mL | Freq: Once | INTRAVENOUS | Status: AC
  Administered 2021-11-24: 250 mL via INTRAVENOUS

## 2021-11-24 MED ORDER — ROCURONIUM BROMIDE 100 MG/10ML IV SOLN
INTRAVENOUS | Status: DC | PRN
  Administered 2021-11-24: 50 mg via INTRAVENOUS

## 2021-11-24 MED ORDER — SODIUM CHLORIDE 0.9 % IV SOLN
60.0000 mg | Freq: Once | INTRAVENOUS | Status: AC
  Administered 2021-11-24: 60 mg via INTRAVENOUS
  Filled 2021-11-24: qty 20
  Filled 2021-11-24: qty 6.67

## 2021-11-24 MED ORDER — MIDAZOLAM HCL 2 MG/2ML IJ SOLN
INTRAMUSCULAR | Status: AC
  Filled 2021-11-24: qty 2

## 2021-11-24 MED ORDER — LACTULOSE ENEMA
300.0000 mL | Freq: Once | ORAL | Status: DC
  Filled 2021-11-24: qty 300

## 2021-11-24 MED ORDER — FENTANYL CITRATE (PF) 100 MCG/2ML IJ SOLN
INTRAMUSCULAR | Status: DC | PRN
  Administered 2021-11-24 (×2): 25 ug via INTRAVENOUS
  Administered 2021-11-24: 50 ug via INTRAVENOUS

## 2021-11-24 MED ORDER — FENTANYL CITRATE (PF) 100 MCG/2ML IJ SOLN
INTRAMUSCULAR | Status: AC
  Filled 2021-11-24: qty 2

## 2021-11-24 MED ORDER — ORAL CARE MOUTH RINSE: 15.0000 mL | OROMUCOSAL | Status: DC | PRN

## 2021-11-24 SURGICAL SUPPLY — 38 items
BLADE SURG 15 STRL LF DISP TIS (BLADE) ×2 IMPLANT
BLADE SURG 15 STRL SS (BLADE) ×2
BLADE SURG SZ11 CARB STEEL (BLADE) ×2 IMPLANT
DRAPE MAG INST 16X20 L/F (DRAPES) ×2 IMPLANT
DRSG TELFA 3X4 N-ADH STERILE (GAUZE/BANDAGES/DRESSINGS) IMPLANT
ELECT CAUTERY BLADE TIP 2.5 (TIP) ×2
ELECT REM PT RETURN 9FT ADLT (ELECTROSURGICAL) ×2
ELECTRODE CAUTERY BLDE TIP 2.5 (TIP) ×2 IMPLANT
ELECTRODE REM PT RTRN 9FT ADLT (ELECTROSURGICAL) ×2 IMPLANT
GAUZE 4X4 16PLY ~~LOC~~+RFID DBL (SPONGE) ×2 IMPLANT
GLOVE BIO SURGEON STRL SZ7.5 (GLOVE) ×2 IMPLANT
GOWN STRL REUS W/ TWL LRG LVL3 (GOWN DISPOSABLE) ×4 IMPLANT
GOWN STRL REUS W/TWL LRG LVL3 (GOWN DISPOSABLE) ×4
HEMOSTAT SURGICEL 2X3 (HEMOSTASIS) IMPLANT
HLDR TRACH TUBE NECKBAND 18 (MISCELLANEOUS) ×2 IMPLANT
HOLDER TRACH TUBE NECKBAND 18 (MISCELLANEOUS) ×2
KIT TURNOVER KIT A (KITS) ×2 IMPLANT
LABEL OR SOLS (LABEL) ×2 IMPLANT
MANIFOLD NEPTUNE II (INSTRUMENTS) ×2 IMPLANT
NS IRRIG 500ML POUR BTL (IV SOLUTION) ×2 IMPLANT
PACK HEAD/NECK (MISCELLANEOUS) ×2 IMPLANT
SHEARS HARMONIC 9CM CVD (BLADE) ×2 IMPLANT
SOL ANTI-FOG 6CC FOG-OUT (MISCELLANEOUS) IMPLANT
SPONGE DRAIN TRACH 4X4 STRL 2S (GAUZE/BANDAGES/DRESSINGS) ×2 IMPLANT
SPONGE KITTNER 5P (MISCELLANEOUS) ×2 IMPLANT
SUCTION FRAZIER HANDLE 10FR (MISCELLANEOUS) ×4
SUCTION TUBE FRAZIER 10FR DISP (MISCELLANEOUS) ×2 IMPLANT
SUT ETHILON 2 0 FS 18 (SUTURE) ×2 IMPLANT
SUT SILK 2 0 (SUTURE)
SUT SILK 2-0 18XBRD TIE 12 (SUTURE) IMPLANT
SUT VICRYL+ 4-0 18IN PS-4 (SUTURE) ×2 IMPLANT
SYR 10ML LL (SYRINGE) ×2 IMPLANT
TRAP FLUID SMOKE EVACUATOR (MISCELLANEOUS) ×2 IMPLANT
TUBE TRACH  6.0 CUFF FLEX (MISCELLANEOUS)
TUBE TRACH 6.0 CUFF FLEX (MISCELLANEOUS) IMPLANT
TUBE TRACH FLEX 8.0 CUFF (MISCELLANEOUS) ×4
TUBE TRACH FLEX 8.5 CUFF (MISCELLANEOUS) IMPLANT
WATER STERILE IRR 500ML POUR (IV SOLUTION) ×2 IMPLANT

## 2021-11-24 NOTE — Procedures (Signed)
Central Venous Catheter Insertion Procedure Note  Kairee Isa  [id number]  Aug 27, 1965  Date:11/30/2021  Time:7:05 PM   Provider Performing:Yahsir Wickens D Dewaine Conger   Procedure: Insertion of Non-tunneled Central Venous Catheter(36556) with US guidance (53976)   Indication(s) Medication administration and Difficult access  Consent Unable to obtain consent due to emergent nature of procedure.  Anesthesia Topical only with 1% lidocaine   Timeout Verified patient identification, verified procedure, site/side was marked, verified correct patient position, special equipment/implants available, medications/allergies/relevant history reviewed, required imaging and test results available.  Sterile Technique Maximal sterile technique including full sterile barrier drape, hand hygiene, sterile gown, sterile gloves, mask, hair covering, sterile ultrasound probe cover (if used).  Procedure Description Area of catheter insertion was cleaned with chlorhexidine and draped in sterile fashion.  With real-time ultrasound guidance a central venous catheter was placed into the left femoral vein. Nonpulsatile blood flow and easy flushing noted in all ports.  The catheter was sutured in place and sterile dressing applied.  Complications/Tolerance None; patient tolerated the procedure well. Chest X-ray is ordered to verify placement for internal jugular or subclavian cannulation.   Chest x-ray is not ordered for femoral cannulation.  EBL Minimal  Specimen(s) None   Line secured at the 20 cm mark. BIOPATCH applied to the insertion site.    Darel Hong, AGACNP-BC [facility] Prefer epic messenger for cross cover needs If after hours, please call E-link

## 2021-11-24 NOTE — Progress Notes (Signed)
..11/19/2021 4:59 PM  Jordan Hayden [id number]  Post-Op Day 0    Temp:  [97.6 F (36.4 C)-98.8 F (37.1 C)] 97.6 F (36.4 C) (11/17 1630) Pulse Rate:  [66-101] 66 (11/17 1641) Resp:  [9-30] 16 (11/17 1641) BP: (68-170)/(29-91) 95/50 (11/17 1641) SpO2:  [90 %-100 %] 98 % (11/17 1641) FiO2 (%):  [40 %] 40 % (11/17 1430) Weight:  [122.1 kg] 122.1 kg (11/16 2000),     Intake/Output Summary (Last 24 hours) at 11/19/2021 1659 Last data filed at 11/26/2021 1630 Gross per 24 hour  Intake 4459.95 ml  Output 530 ml  Net 3929.95 ml    Results for orders placed or performed during the hospital encounter of 12/02/2021 (from the past 24 hour(s))  Prepare platelet pheresis     Status: None   Collection Time: 11/25/2021  5:02 PM  Result Value Ref Range   Unit Number [id number]    Blood Component Type PLTP1 PSORALEN TREATED    Unit division 00    Status of Unit ISSUED,FINAL    Transfusion Status      OK TO TRANSFUSE Performed at [facility], [address] [place], [place] [address]   MRSA Next Gen by PCR, Nasal     Status: None   Collection Time: 11/25/2021  8:00 PM   Specimen: Nasal Mucosa; Nasal Swab  Result Value Ref Range   MRSA by PCR Next Gen NOT DETECTED NOT DETECTED  Glucose, capillary     Status: None   Collection Time: 11/08/2021  8:00 PM  Result Value Ref Range   Glucose-Capillary 88 70 - 99 mg/dL  Type and screen Ordered by PROVIDER DEFAULT     Status: None   Collection Time: 11/11/2021  8:10 PM  Result Value Ref Range   ABO/RH(D) B POS    Antibody Screen NEG    Sample Expiration      11/26/2021,2359 Performed at [facility], [address] [place], [place] [address]   CBC     Status: Abnormal   Collection Time: 11/12/2021  8:10 PM  Result Value Ref Range   WBC 9.1 4.0 - 10.5 K/uL   RBC 3.60 (L) 4.22 - 5.81 MIL/uL   Hemoglobin 12.2 (L) 13.0 - 17.0 g/dL   HCT 36.2 (L) 39.0 - 52.0 %   MCV 100.6 (H) 80.0 - 100.0 fL   MCH 33.9 26.0 - 34.0  pg   MCHC 33.7 30.0 - 36.0 g/dL   RDW 17.4 (H) 11.5 - 15.5 %   Platelets 87 (L) 150 - 400 K/uL   nRBC 0.8 (H) 0.0 - 0.2 %  Fibrinogen     Status: Abnormal   Collection Time: 11/29/2021  8:10 PM  Result Value Ref Range   Fibrinogen 149 (L) 210 - 475 mg/dL  CBC     Status: Abnormal   Collection Time: 11/08/2021 11:20 PM  Result Value Ref Range   WBC 12.1 (H) 4.0 - 10.5 K/uL   RBC 3.76 (L) 4.22 - 5.81 MIL/uL   Hemoglobin 12.8 (L) 13.0 - 17.0 g/dL   HCT 38.5 (L) 39.0 - 52.0 %   MCV 102.4 (H) 80.0 - 100.0 fL   MCH 34.0 26.0 - 34.0 pg   MCHC 33.2 30.0 - 36.0 g/dL   RDW 17.7 (H) 11.5 - 15.5 %   Platelets 80 (L) 150 - 400 K/uL   nRBC 0.5 (H) 0.0 - 0.2 %  Basic metabolic panel     Status: Abnormal   Collection Time:  11/26/2021  3:11 AM  Result Value Ref Range   Sodium 144 135 - 145 mmol/L   Potassium 5.1 3.5 - 5.1 mmol/L   Chloride 103 98 - 111 mmol/L   CO2 29 22 - 32 mmol/L   Glucose, Bld 144 (H) 70 - 99 mg/dL   BUN 32 (H) 6 - 20 mg/dL   Creatinine, Ser 2.29 (H) 0.61 - 1.24 mg/dL   Calcium 12.9 (H) 8.9 - 10.3 mg/dL   GFR, Estimated 33 (L) >60 mL/min   Anion gap 12 5 - 15  CBC     Status: Abnormal   Collection Time: 11/16/2021  3:11 AM  Result Value Ref Range   WBC 12.8 (H) 4.0 - 10.5 K/uL   RBC 3.64 (L) 4.22 - 5.81 MIL/uL   Hemoglobin 12.4 (L) 13.0 - 17.0 g/dL   HCT 37.2 (L) 39.0 - 52.0 %   MCV 102.2 (H) 80.0 - 100.0 fL   MCH 34.1 (H) 26.0 - 34.0 pg   MCHC 33.3 30.0 - 36.0 g/dL   RDW 17.6 (H) 11.5 - 15.5 %   Platelets 78 (L) 150 - 400 K/uL   nRBC 0.5 (H) 0.0 - 0.2 %  Hepatic function panel     Status: Abnormal   Collection Time: 11/17/2021  3:11 AM  Result Value Ref Range   Total Protein 6.8 6.5 - 8.1 g/dL   Albumin 2.3 (L) 3.5 - 5.0 g/dL   AST 80 (H) 15 - 41 U/L   ALT 34 0 - 44 U/L   Alkaline Phosphatase 104 38 - 126 U/L   Total Bilirubin 4.9 (H) 0.3 - 1.2 mg/dL   Bilirubin, Direct 2.3 (H) 0.0 - 0.2 mg/dL   Indirect Bilirubin 2.6 (H) 0.3 - 0.9 mg/dL  CBC     Status: Abnormal    Collection Time: 11/10/2021  8:23 AM  Result Value Ref Range   WBC 10.9 (H) 4.0 - 10.5 K/uL   RBC 3.32 (L) 4.22 - 5.81 MIL/uL   Hemoglobin 11.2 (L) 13.0 - 17.0 g/dL   HCT 34.0 (L) 39.0 - 52.0 %   MCV 102.4 (H) 80.0 - 100.0 fL   MCH 33.7 26.0 - 34.0 pg   MCHC 32.9 30.0 - 36.0 g/dL   RDW 17.8 (H) 11.5 - 15.5 %   Platelets 57 (L) 150 - 400 K/uL   nRBC 0.4 (H) 0.0 - 0.2 %  Protime-INR     Status: Abnormal   Collection Time: 12/02/2021  8:23 AM  Result Value Ref Range   Prothrombin Time 25.0 (H) 11.4 - 15.2 seconds   INR 2.3 (H) 0.8 - 1.2  Prepare cryoprecipitate     Status: None (Preliminary result)   Collection Time: 11/21/2021  9:46 AM  Result Value Ref Range   Unit Number [id number]    Blood Component Type CRYPOOL THAW    Unit division 00    Status of Unit ISSUED    Transfusion Status      OK TO TRANSFUSE Performed at [facility], [address], [place], [place] [address]    Unit Number [id number]    Blood Component Type CRYPOOL THAW    Unit division 00    Status of Unit ISSUED    Transfusion Status OK TO TRANSFUSE   Prepare platelet pheresis     Status: None (Preliminary result)   Collection Time: 11/20/2021  9:46 AM  Result Value Ref Range   Unit Number [id number]    Blood Component Type PLTP1 PSORALEN  TREATED    Unit division 00    Status of Unit ISSUED    Transfusion Status      OK TO TRANSFUSE Performed at [facility], [address] [place], [place] [address]   CBC     Status: Abnormal   Collection Time: 11/08/2021 10:14 AM  Result Value Ref Range   WBC 9.6 4.0 - 10.5 K/uL   RBC 2.68 (L) 4.22 - 5.81 MIL/uL   Hemoglobin 9.1 (L) 13.0 - 17.0 g/dL   HCT 27.2 (L) 39.0 - 52.0 %   MCV 101.5 (H) 80.0 - 100.0 fL   MCH 34.0 26.0 - 34.0 pg   MCHC 33.5 30.0 - 36.0 g/dL   RDW 17.8 (H) 11.5 - 15.5 %   Platelets 45 (L) 150 - 400 K/uL   nRBC 0.4 (H) 0.0 - 0.2 %  Protime-INR     Status: Abnormal   Collection Time: 11/09/2021 10:14 AM  Result  Value Ref Range   Prothrombin Time 30.8 (H) 11.4 - 15.2 seconds   INR 3.0 (H) 0.8 - 1.2  APTT     Status: Abnormal   Collection Time: 11/11/2021 10:14 AM  Result Value Ref Range   aPTT 41 (H) 24 - 36 seconds  Sample to Blood Bank     Status: None   Collection Time: 11/11/2021 10:14 AM  Result Value Ref Range   Blood Bank Specimen SAMPLE AVAILABLE FOR TESTING    Sample Expiration      11/27/2021,2359 Performed at [facility], [address], [place], [place] [address]   Prepare fresh frozen plasma     Status: None (Preliminary result)   Collection Time: 12/02/2021 10:30 AM  Result Value Ref Range   Unit Number [id number]    Blood Component Type THW PLS APHR    Unit division A0    Status of Unit ISSUED    Transfusion Status      OK TO TRANSFUSE Performed at [facility], [address] [place], [place] [address]   Prepare platelet pheresis     Status: None (Preliminary result)   Collection Time: 11/17/2021 10:30 AM  Result Value Ref Range   Unit Number [id number]    Blood Component Type PLTP1 PSORALEN TREATED    Unit division 00    Status of Unit ISSUED    Transfusion Status      OK TO TRANSFUSE Performed at [facility], [address] [place], [place] [address]   Blood gas, arterial     Status: Abnormal (Preliminary result)   Collection Time: 11/30/2021  3:49 PM  Result Value Ref Range   FIO2 40 %   Delivery systems VENTILATOR    Mode PRESSURE REGULATED VOLUME CONTROL    MECHVT 500 mL   PEEP 5 cm H20   pH, Arterial 7.45 7.35 - 7.45   pCO2 arterial 43 32 - 48 mmHg   pO2, Arterial 101 83 - 108 mmHg   Bicarbonate 29.9 (H) 20.0 - 28.0 mmol/L   Acid-Base Excess 5.2 (H) 0.0 - 2.0 mmol/L   O2 Saturation 99.2 %   Patient temperature 37.0    Collection site RIGHT RADIAL    Drawn by PENDING    Allens test (pass/fail) PASS PASS   Mechanical Rate 16   Prepare platelet pheresis     Status: None (Preliminary result)   Collection Time: 11/17/2021   3:51 PM  Result Value Ref Range   Unit Number [id number]    Blood Component  Type PLTP3 PSORALEN TREATED    Unit division 00    Status of Unit ISSUED    Transfusion Status      OK TO TRANSFUSE Performed at [facility], [address] [place], [place] [address]   Prepare fresh frozen plasma     Status: None (Preliminary result)   Collection Time: 11/10/2021  3:51 PM  Result Value Ref Range   Unit Number [id number]    Blood Component Type THW PLS APHR    Unit division B0    Status of Unit ISSUED    Transfusion Status OK TO TRANSFUSE    Unit Number [id number]    Blood Component Type THAWED PLASMA    Unit division 00    Status of Unit ALLOCATED    Transfusion Status OK TO TRANSFUSE   Prepare cryoprecipitate     Status: None (Preliminary result)   Collection Time: 11/14/2021  3:52 PM  Result Value Ref Range   Unit Number [id number]    Blood Component Type CRYPOOL THAW    Unit division 00    Status of Unit ALLOCATED    Transfusion Status OK TO TRANSFUSE     SUBJECTIVE:  bleeding from tracheostomy wound and mouth and called to evaluate.    OBJECTIVE:  NECK-  trach secure and patent and ventilating well.  Clot surrounding tracheostomy site with soaked cloth as well and venous ooze present from tracheostomy site.  Venous oozing from oral cavity as well.  Surgicel placed within the trach wound bed.  This helped some but continued to ooze and kerlex type gauze placed into the tracheostomy wound with improved control of bleeding.  Very slow wicking of blood up gauze.    IMPRESSION:  s/p tracheostomy with coagulopathy from cirrhosis  PLAN:  FFP/Platelets/Cryo as per ICU to help with coagulopathy.  I anticipate intermittent oozing that worsens as his coagulopathy worsens again.  Limited options regarding stopping of bleeding with coagulopathy with an open wound other than intermittent packing with gauze.  Will continue to follow.  Eilish Mcdaniel 11/25/2021, 4:59 PM

## 2021-11-24 NOTE — Procedures (Signed)
Arterial Catheter Insertion Procedure Note  Jordan Hayden  [id number]  Mar 15, 1965  Date:11/10/2021  Time:7:07 PM    Provider Performing: Bradly Bienenstock    Procedure: Insertion of Arterial Line [id number]) with US guidance (40352)   Indication(s) Blood pressure monitoring and/or need for frequent ABGs  Consent Unable to obtain consent due to emergent nature of procedure.  Anesthesia None   Time Out Verified patient identification, verified procedure, site/side was marked, verified correct patient position, special equipment/implants available, medications/allergies/relevant history reviewed, required imaging and test results available.   Sterile Technique Maximal sterile technique including full sterile barrier drape, hand hygiene, sterile gown, sterile gloves, mask, hair covering, sterile ultrasound probe cover (if used).   Procedure Description Area of catheter insertion was cleaned with chlorhexidine and draped in sterile fashion. With real-time ultrasound guidance an arterial catheter was placed into the left femoral artery.  Appropriate arterial tracings confirmed on monitor.     Complications/Tolerance None; patient tolerated the procedure well.   EBL Minimal   Specimen(s) None   BIOPATCH applied to the insertion site.    Darel Hong, AGACNP-BC [facility] Prefer epic messenger for cross cover needs If after hours, please call E-link

## 2021-11-24 NOTE — Consult Note (Signed)
Pulmonary and Critical care    NAME:  Jordan Hayden, MRN:  [id number], DOB:  1965/01/28, LOS: 1 ADMISSION DATE:  12/02/2021,     History of Present Illness:  The patient is a 56 year old male non-smoker with history of morbid obesity alcoholic cirrhosis history of esophageal varices and history of hepatic encephalopathy.  It appears patient was evaluated for thrombocytopenia and August 2023 at that time a 2 cm mobile lymph node was noted on the right side of his neck.  It was felt that the thrombocytopenia is secondary to liver disease and splenomegaly.  The patient presented to the ER on November 16 with vomiting and unable to eat or drink also had an episode of hematemesis.  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  Concern for malignant adenopathy in the neck.  Compression of right jugular vein.  The patient was evaluated by ENT service and today November 17 patient is s/p urgent tracheostomy for impending airway closure. patient has been seen by gastroenterology service as well for episode of hematemesis.  Patient was started on PPI as well as octreotide.  Postsurgery patient has had bleeding from tracheostomy site. He is getting empirically FFP cryoprecipitate vitamin K and platelets.  Patient was also hypotensive during this bleeding episode and 1 L LR bolus was given. Dr Pryor Ochoa present at bedside packing was done.          11/30/2021    1:00 PM 11/16/2021   11:45 AM 11/16/2021   11:32 AM  Vitals with BMI  Height '5\' 8"'$     Systolic  505 [id number]  Diastolic  91 90  Pulse  86 91    Vent Mode: PRVC FiO2 (%):  [40 %] 40 % Set Rate:  [16 bmp] 16 bmp Vt Set:  [500 mL] 500 mL PEEP:  [5 cmH20] 5 cmH20   Examination: General: Sedated on the ventilator HENT: Sniffing and bleeding from the tracheal site towels soaked in blood including tracheal dressing soaked in blood, icterus  present Lungs: Air entry heard bilaterally no wheezing Cardiovascular: S1-S2 no murmurs  abdomen: Soft nontender Extremities: Edema present Neuro: Not move extremities spontaneously or to commands     Assessment & Plan:  1.  Cervical mass with adenopathy?  Malignancy?  Abscess.  This post biopsy 2.  Airway narrowing and shortness of breath with cervical lymphadenopathy s/p emergent tracheostomy. 3.  Alcoholic cirrhosis 4.  Esophageal varices?  GI bleed 5.  Thrombocytopenia 6.  Frequent bleeding from tracheostomy site due to thrombocytopenia and coagulopathy 7.  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  History of alcohol abuse 9.  Acute kidney injury and concern for hepatic encephalopathy 10.  poor prognosis. 11.  Morbid obesity 12.  Jaundice 13.  Hemorrhagic shock. Plan 1.  Transfuse multiple blood products FFP, platelets and cryoprecipitate also repeat vitamin K infusion 2.  Recheck CBC, fibrinogen and coagulation parameters PT PTT after transfusion. 3.  PPI and octreotide 4.  Will require central line as well for multiple infusions needing exhausting peripheral access. 5.  Fall and fentanyl for sedation 6.  Continue with PEEP 5 and 40% FiO2 7.  Lactulose 8.  Recheck ammonia tomorrow   Best Practice (right click and "Reselect all SmartList Selections" daily)   Diet/type: NPO DVT prophylaxis: SCD GI prophylaxis: PPI Lines: N/A Foley:  Yes, and it is still needed Code Status:  full code  Labs   CBC: Recent Labs  Lab 11/11/2021 1422 12/05/2021 2010 12/05/2021 2320 12/05/2021 0311 11/08/2021 0823 11/25/2021 1014  WBC 6.5 9.1 12.1* 12.8* 10.9* 9.6  NEUTROABS 4.4  --   --   --   --   --   HGB 12.6* 12.2* 12.8* 12.4* 11.2* 9.1*  HCT 38.8* 36.2* 38.5* 37.2* 34.0* 27.2*  MCV 102.6* 100.6* 102.4* 102.2* 102.4* 101.5*  PLT 55* 87* 80* 78* 57* 45*    Basic Metabolic Panel: Recent Labs  Lab 11/09/2021 1422 11/20/2021 0311  NA 144 144  K 5.5* 5.1  CL 106 103  CO2 29 29   GLUCOSE 111* 144*  BUN 22* 32*  CREATININE 1.40* 2.29*  CALCIUM 12.2* 12.9*  MG 2.6*  --    GFR: Estimated Creatinine Clearance: 46.9 mL/min (A) (by C-G formula based on SCr of 2.29 mg/dL (H)). Recent Labs  Lab 11/27/2021 2320 11/09/2021 0311 11/10/2021 0823 11/14/2021 1014  WBC 12.1* 12.8* 10.9* 9.6    Liver Function Tests: Recent Labs  Lab 12/02/2021 1422 11/29/2021 0311  AST 68* 80*  ALT 30 34  ALKPHOS 89 104  BILITOT 4.4* 4.9*  PROT 6.1* 6.8  ALBUMIN 2.1* 2.3*   No results for input(s): "LIPASE", "AMYLASE" in the last 168 hours. Recent Labs  Lab 11/17/2021 1422  AMMONIA 48*    ABG No results found for: "PHART", "PCO2ART", "PO2ART", "HCO3", "TCO2", "ACIDBASEDEF", "O2SAT"   Coagulation Profile: Recent Labs  Lab 11/17/2021 1422 11/30/2021 0823 11/20/2021 1014  INR 2.3* 2.3* 3.0*    Cardiac Enzymes: No results for input(s): "CKTOTAL", "CKMB", "CKMBINDEX", "TROPONINI" in the last 168 hours.  HbA1C: No results found for: "HGBA1C"  CBG: Recent Labs  Lab 11/17/2021 2000  GLUCAP 88    RADIOLOGY Narrative & Impression  CLINICAL DATA:  Neck mass   EXAM: CT NECK WITH CONTRAST   TECHNIQUE: Multidetector CT imaging of the neck was performed using the standard protocol following the bolus administration of intravenous contrast.   RADIATION DOSE REDUCTION: This exam was performed according to the departmental dose-optimization program which includes automated exposure control, adjustment of the mA and/or kV according to patient size and/or use of iterative reconstruction technique.   CONTRAST:  38m OMNIPAQUE IOHEXOL 300 MG/ML  SOLN   COMPARISON:  None Available.   FINDINGS: Pharynx and larynx: Mucosal mass involving the right supraglottic airway extending into the airway and causing significant airway narrowing. Mass is irregular and extends down to the vocal cords and could involve the right vocal cord. Epicenter is above the vocal cord. Mass extends up to the  right lateral oropharynx.   Salivary glands: No inflammation, mass, or stone. Right submandibular gland surrounded by malignant lymph nodes.   Thyroid: Negative   Lymph nodes: Malignant adenopathy in neck bilaterally right greater than left. Large conglomeration of lymph nodes throughout the right neck, many of which have necrosis. Right submandibular necrotic nodes measuring 15 mm and 14 mm. Right level 2 lymph nodes 29 mm and 20 mm. Right level 3 lymph nodes 15 mm and 21 mm and 17 mm. Right level 4 lymph nodes 17 mm and 28 mm.   Submental lymph node to the right of midline 17 mm.   Small necrotic left level 2 lymph node 9 mm. Posterior lymph nodes on the left measuring 14 mm and 13 mm. Additional smaller lymph nodes on the left.   Vascular: Right jugular vein is compressed by the large lymph node mass on the right. No thrombus. Left  jugular vein narrowed but patent.   Limited intracranial: Negative   Visualized orbits: Negative   Mastoids and visualized paranasal sinuses: Mucosal edema paranasal sinuses. No air-fluid level. Mastoid clear.   Skeleton: No acute abnormality.   Upper chest: 5 mm nodule in the superior segment of the right lower lobe. 3 mm nodule left upper lobe.   Other: None   IMPRESSION: 1. 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 Mass extends down to the vocal cords and could involve the right vocal cord. Mass extends up to the right lateral oropharynx. 2. Malignant adenopathy in the neck bilaterally right greater than left. Largest lymph node mass is in the right neck measuring 29 mm and 20 mm. 3. 5 mm nodule superior segment right lower lobe and 3 mm nodule left upper lobe. Metastatic disease is possible. Chest CT recommended. 4. Right jugular vein is compressed by the large lymph node mass on the right. No thrombus. Left jugular vein narrowed but patent. 5. These results  were called by telephone at the time of interpretation on 11/11/2021 at 3:49 pm to provider [facility], who verbally acknowledged these results.        Review of Systems:   Patient is currently on ventilator unable to give review of systems  Past Medical History:  He,  has a past medical history of Alcohol abuse, Alcoholic cirrhosis ([place]), Alcoholism ([place]), Arthritis, and Cirrhosis ([place]).   Surgical History:   Past Surgical History:  Procedure Laterality Date   ESOPHAGOGASTRODUODENOSCOPY (EGD) WITH PROPOFOL N/A 07/01/2017   Procedure: ESOPHAGOGASTRODUODENOSCOPY (EGD) WITH PROPOFOL;  Surgeon: Lucilla Lame, MD;  Location: [facility] ENDOSCOPY;  Service: Endoscopy;  Laterality: N/A;     Social History:   reports that he has never smoked. He has never used smokeless tobacco. He reports that he does not drink alcohol and does not use drugs.   Family History:  His family history includes Cervical cancer in his sister; Cirrhosis in his brother; Diabetes in his father; Hypertension in his mother.   Allergies Allergies  Allergen Reactions   Hydrocodone-Acetaminophen Swelling    Mouth Swelling    Penicillins Swelling     Home Medications  Prior to Admission medications   Medication Sig Start Date End Date Taking? Authorizing Provider  chlorhexidine (PERIDEX) 0.12 % solution Use as directed 5 mLs in the mouth or throat 2 (two) times daily. 11/16/21  Yes [provider]  albuterol (VENTOLIN HFA) 108 (90 Base) MCG/ACT inhaler Inhale into the lungs. Patient not taking: Reported on 11/12/2021 02/02/21 02/02/22  [provider]  aspirin EC 81 MG tablet Take 81 mg by mouth daily. Patient not taking: Reported on 07/25/2021 03/05/16   [provider]  doxycycline (VIBRAMYCIN) 100 MG capsule Take 100 mg by mouth 2 (two) times daily. Patient not taking: Reported on 11/13/2021 07/19/21   [provider]  ferrous sulfate 325 (65 FE) MG tablet Take 325 mg by mouth every  morning. 06/05/20   [provider]  furosemide (LASIX) 20 MG tablet Take 1 tablet (20 mg total) by mouth daily. 07/02/17 08/31/20  Salary, Holly Bodily D, MD  hydrOXYzine (ATARAX/VISTARIL) 25 MG tablet Take 25 mg by mouth 3 (three) times daily as needed. 04/01/17   [provider]  lactulose (CHRONULAC) 10 GM/15ML solution Take 30 mLs (20 g total) by mouth 3 (three) times daily. 08/31/20   Ezekiel Slocumb, DO  meloxicam (MOBIC) 15 MG tablet Take 1 tablet (15 mg  total) by mouth daily. 07/22/18   Cuthriell, Charline Bills, PA-C  pantoprazole (PROTONIX) 40 MG tablet Take 1 tablet (40 mg total) by mouth daily. 07/02/17 08/31/20  Salary, Avel Peace, MD  potassium chloride SA (K-DUR,KLOR-CON) 20 MEQ tablet Take 2 tablets (40 mEq total) by mouth 2 (two) times daily. 07/02/17   Salary, Avel Peace, MD  spironolactone (ALDACTONE) 25 MG tablet Take 0.5 tablets (12.5 mg total) by mouth 2 (two) times daily. 07/02/17   Salary, Avel Peace, MD  traMADol (ULTRAM) 50 MG tablet Take 50 mg by mouth every 6 (six) hours as needed for moderate pain. Patient not taking: Reported on 11/26/2021 11/16/21   [provider]  XIFAXAN 550 MG TABS tablet Take 550 mg by mouth 2 (two) times daily. 08/08/20   [provider]     Critical care time: 45 I have personally spent __________ minutes of critical care time, exclusive of time spent on any  procedures, in evaluation and management of this critically ill patient's condition.    Kajol Crispen MD ([place]) FOR [facility]

## 2021-11-24 NOTE — Op Note (Signed)
..11/10/2021 1:01 PM  [place], [place] [id number]  Pre-Op Dx: Supraglottic mass, dysphagia, odynophagia, hematemesis  Post-Op Dx:  same  Proc:   1)  Awake Tracheostomy- Urgent  2)  Microlaryngoscopy with Biopsy  Surg:  [name]  Assistant:  Hillery Jacks:  GOT  EBL:  <26m  Comp:  none  Findings:  Successful placement of awake tracheostomy between tracheal rings 2 and 3 with Shiley size 8 cuffed tracheostomy tube.  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  No identifiable airway was seen due to active bleeding from coagulopathy and bleeding mass.  Multiple biopsies taken from right tonsil/base of tongue region of mass.  Procedure:  The patient was brought from the intensive care unit to the operating room and transferred to an operating table.  Anesthesia was administered via IV Ketamine.  The lower neck was palpated with the findings as described above.  1% Xylocaine with 1:100,000 epinephrine, 6 cc's, was infiltrated into the surgical field for intraoperative hemostasis.  Several minutes were allowed for this to take effect. The patient was prepped in a sterile fashion with a surgical prep from the chin down to the upper chest.  Sterile draping was accomplished in the standard fashion.  The patient's face was left open to ensure no oxygen tenting and to observe patient's movements.  FFP was hung upon movement to operating bed due to coagulopathy and thrombocytopenia.  A  5 cm horizontal incision was made sharply a finger's breadth above the sternal notch, and extended through skin and subcutaneous fat.  Using cautery, the superficial layer of the deep cervical fascia was lysed.  Additional dissection revealed the strap muscles.  The midline raphe was divided in two layers and the muscles retracted laterally.  The pretracheal plane was  visualized.  This was entered bluntly.  The thyroid isthmus was isolated and divided with the Harmonic scalpel.  The thyroid gland was retracted to either side.  The anterior face of the trachea was cleared.  In the  2nd-3rd interspace, a transverse incision was made between cartilage rings into the tracheal lumen.  A 6 mm wide inferiorly based flap was generated and secured to the lower wound with a 4-0 chromic suture.   A previously tested  # 8 Shiley cuffed tracheostomy tube was brought into the field.  With the endotracheal tube under direct visualization through the tracheostomy, it was gently backed up.  The tracheostomy tube was inserted into the tracheal lumen.  Hemostasis was observed. The cuff was inflated and observed to be intact and containing pressure. The inner cannula was placed and ventilation assumed per tracheostomy tube.  Good chest wall motion was observed, and CO2 was documented per anesthesia.  The trach tube was secured in the standard fashion with trach ties. A 2-0 Nylon suture was used to secure the trach tube to the skin on both sides.  Hemostasis was observed again.    At this point transition to the laryngoscopy with biopsy portion of the procedure was made.  A mouth guard was placed into his oral cavity to help with protection of his upper teeth remnants.  A dedo laryngoscope was inserted into the patient's oral cavity.  This demonstrated a friable mass involving his right lateral pharyngeal side wall and extending inferiorly involving base of tongue, epiglottis and glottis.  Bulky mass displaced the epiglottis to the left.  Bubbles from beneath  this mass were seen but no frank airway was visualized due to bleeding and friability of mass.  Multiple biopsies were taken from friable mass and passed off the table for permanent pathological evaluation.  Afrin soaked pledgets were used for hemostasis.  At this point the procedure was completed.  The patient was returned to anesthesia,  awakened as possible, and transferred back to the intensive care unit in stable condition.  Comment: 56 y.o. male with impending airway compromise from supraglottic mass was the indication for today's procedure.  Anticipate a routine postoperative recovery including standard tracheal hygiene.  The sutures should be removed in 5 days.  When the patient no longer requires ventilator or pressure support, the cuff should be deflated.  Changing to an uncuffed tube and downsizing will be according to the clinical condition of the patient.   Jordan Hayden  1:01 PM 12/05/2021

## 2021-11-24 NOTE — Progress Notes (Signed)
PHARMACY CONSULT NOTE - FOLLOW UP  Pharmacy Consult for Electrolyte Monitoring and Replacement   Recent Labs: Potassium (mmol/L)  Date Value  11/08/2021 5.1   Magnesium (mg/dL)  Date Value  11/30/2021 2.6 (H)   Calcium (mg/dL)  Date Value  11/16/2021 12.9 (H)   Albumin (g/dL)  Date Value  12/02/2021 2.3 (L)   Sodium (mmol/L)  Date Value  12/01/2021 144    Assessment: 56 year old male with history of advanced alcohol associated liver cirrhosis, alcohol abuse currently in remission, thrombocytopenia, iron deficiency anemia, morbid obesity, who presents to the ED for chief concern of nausea and vomiting blood. Pharmacy is asked to follow and replace electrolytes while in CCU  Goal of Therapy:  Electrolytes WNL  Plan:  ---no electrolyte replacement warranted for today ---recheck electrolytes in am  Dallie Piles ,PharmD Clinical Pharmacist 11/26/2021 2:50 PM

## 2021-11-24 NOTE — Progress Notes (Signed)
PHARMACY NOTE:  ANTIMICROBIAL RENAL DOSAGE ADJUSTMENT  Current antimicrobial regimen includes a mismatch between antimicrobial dosage and estimated renal function.  As per policy approved by the Pharmacy & Therapeutics and Medical Executive Committees, the antimicrobial dosage will be adjusted accordingly.  Current antimicrobial dosage:  meropenem 1 gram IV every 8 hours  Indication: Supraglottic mass   Renal Function:  Estimated Creatinine Clearance: 46.9 mL/min (A) (by C-G formula based on SCr of 2.29 mg/dL (H)).     Antimicrobial dosage has been changed to:  meropenem 1 gram IV every 12 hours   Thank you for allowing pharmacy to be a part of this patient's care.  Dallie Piles, [facility] 11/28/2021 7:41 AM

## 2021-11-24 NOTE — Transfer of Care (Signed)
Immediate Anesthesia Transfer of Care Note  Patient: Jordan Hayden  Procedure(s) Performed: AWAKE TRACHEOSTOMY DIRECT LARYNGOSCOPY WITH BIOPSIES  Patient Location: PACU and ICU  Anesthesia Type:General  Level of Consciousness: sedated and Patient remains intubated per anesthesia plan  Airway & Oxygen Therapy: Patient remains intubated per anesthesia plan and Patient placed on Ventilator (see vital sign flow sheet for setting)  Post-op Assessment: Report given to RN and Post -op Vital signs reviewed and stable  Post vital signs: Reviewed and stable  Last Vitals:  Vitals Value Taken Time  BP 164/87 12/07/2021 1324  Temp    Pulse 85 11/17/2021 1327  Resp 16 11/08/2021 1327  SpO2 100 % 11/20/2021 1327  Vitals shown include unvalidated device data.  Last Pain:  Vitals:   11/10/2021 1145  TempSrc: Oral  PainSc:          Complications: No notable events documented.

## 2021-11-24 NOTE — Progress Notes (Signed)
Hematology/Oncology Progress note [phone] [phone]     Patient Care Team: Gearl Body, MD as PCP - General (Family Medicine) Tsosie Billing, MD as Consulting Physician (Infectious Diseases) Earlie Server, MD as Consulting Physician (Oncology)   Name of the patient: Jordan Hayden  [id number]  06/17/1965  Date of visit: 11/28/2021   INTERVAL HISTORY-  S/p Trach and biopsy today. On ventilator and sedated.  Patient has received platelet transfusion, cryoprecipitate, and FFP prior to Tach surgery.    Allergies  Allergen Reactions   Hydrocodone-Acetaminophen Swelling    Mouth Swelling    Penicillins Swelling    Patient Active Problem List   Diagnosis Date Noted   Thrombocytopenia ([place]) 04/01/2017    Priority: High   Advanced cirrhosis of liver ([place]) 02/17/2016    Priority: Medium    Neck mass 12/06/2021   Hemoptysis 11/10/2021   Hepatic encephalopathy ([place]) 08/30/2020   Sacroiliitis ([place]) 09/30/2018   Bacteremia due to Gram-negative bacteria 09/12/2018   Cellulitis 06/16/2018   Morbid obesity with BMI of 40.0-44.9, adult ([place]) 02/12/2018   Sepsis ([place]) 06/28/2017   Iron deficiency anemia 05/19/2017   Anasarca 02/29/2016   Dupuytren's contracture of right hand 02/17/2016   Hyperbilirubinemia 02/17/2016     Past Medical History:  Diagnosis Date   Alcohol abuse    Alcoholic cirrhosis ([place])    Alcoholism ([place])    Arthritis    Cirrhosis ([place])      Past Surgical History:  Procedure Laterality Date   ESOPHAGOGASTRODUODENOSCOPY (EGD) WITH PROPOFOL N/A 07/01/2017   Procedure: ESOPHAGOGASTRODUODENOSCOPY (EGD) WITH PROPOFOL;  Surgeon: Lucilla Lame, MD;  Location: [facility] ENDOSCOPY;  Service: Endoscopy;  Laterality: N/A;    Social History   Socioeconomic History   Marital status: Single    Spouse name: Not on file   Number of children: 1   Years of education: Not on file   Highest education level: Not on file  Occupational  History   Not on file  Tobacco Use   Smoking status: Never   Smokeless tobacco: Never  Vaping Use   Vaping Use: Never used  Substance and Sexual Activity   Alcohol use: No    Comment: quit 6 years   Drug use: No   Sexual activity: Not Currently    Partners: Female  Other Topics Concern   Not on file  Social History Narrative   Not on file   Social Determinants of Health   Financial Resource Strain: Not on file  Food Insecurity: Not on file  Transportation Needs: Not on file  Physical Activity: Not on file  Stress: Not on file  Social Connections: Not on file  Intimate Partner Violence: Not on file     Family History  Problem Relation Age of Onset   Hypertension Mother    Diabetes Father    Cervical cancer Sister    Cirrhosis Brother      Current Facility-Administered Medications:    0.9 %  sodium chloride infusion (Manually program via Guardrails IV Fluids), 250 mL, Intravenous, Once, Verlon Au, NP, Stopped at 11/20/2021 1333   0.9 %  sodium chloride infusion (Manually program via Guardrails IV Fluids), , Intravenous, Once, Darel Hong D, NP   0.9 %  sodium chloride infusion, , Intravenous, Continuous, Cox, Amy N, DO, Stopped at 11/15/2021 1146   acetaminophen (TYLENOL) tablet 650 mg, 650 mg, Oral, Q6H PRN **OR** acetaminophen (TYLENOL) suppository 650 mg, 650 mg, Rectal, Q6H PRN, Cox, Amy N, DO   albumin human  25 % solution 100 g, 100 g, Intravenous, Daily, Annamaria Helling, DO, Stopped at 11/30/2021 1144   albuterol (PROVENTIL) (2.5 MG/3ML) 0.083% nebulizer solution 2.5 mg, 2.5 mg, Inhalation, Q6H PRN, Cox, Amy N, DO   cefTRIAXone (ROCEPHIN) 2 g in sodium chloride 0.9 % 100 mL IVPB, 2 g, Intravenous, Q24H, Fritzi Mandes, MD   Chlorhexidine Gluconate Cloth 2 % PADS 6 each, 6 each, Topical, Daily, Cox, Amy N, DO, 6 each at 11/30/2021 2003   fentaNYL 2550mg in NS 2544m(103mml) infusion-PREMIX, 0-400 mcg/hr, Intravenous, Continuous, KeeDarel Hong NP, Last  Rate: 10 mL/hr at 11/16/2021 1559, 100 mcg/hr at 11/21/2021 1559   heparin lock flush 100 unit/mL, 500 Units, Intracatheter, Daily PRN, AllVerlon AuP   heparin lock flush 100 unit/mL, 250 Units, Intracatheter, PRN, AllVerlon AuP   heparin lock flush 100 unit/mL, 500 Units, Intracatheter, Daily PRN, AllVerlon AuP   heparin lock flush 100 unit/mL, 250 Units, Intracatheter, PRN, AllVerlon AuP   lactated ringers bolus 1,000 mL, 1,000 mL, Intravenous, Once, Chawla, Harsh, MD   lactulose (CHRONULAC) enema 200 gm, 300 mL, Rectal, Once, Chawla, Harsh, MD   [DISCONTINUED] cefTRIAXone (ROCEPHIN) 2 g in sodium chloride 0.9 % 100 mL IVPB, 2 g, Intravenous, Q24H, Stopped at 12/02/2021 1545 **AND** metroNIDAZOLE (FLAGYL) IVPB 500 mg, 500 mg, Intravenous, Q12H, Cox, Amy N, DO, Stopped at 11/13/2021 1448   midazolam (VERSED) 2 MG/2ML injection, , , ,    midazolam (VERSED) injection 2 mg, 2 mg, Intravenous, Q2H PRN, KeeDarel Hong NP   [COMPLETED] octreotide (SANDOSTATIN) 2 mcg/mL load via infusion 50 mcg, 50 mcg, Intravenous, Once, 50 mcg at 11/27/2021 1850 **AND** octreotide (SANDOSTATIN) 500 mcg in sodium chloride 0.9 % 250 mL (2 mcg/mL) infusion, 50 mcg/hr, Intravenous, Continuous, Cox, Amy N, DO, Stopped at 12/01/2021 1147   ondansetron (ZOFRAN) tablet 4 mg, 4 mg, Oral, Q6H PRN **OR** ondansetron (ZOFRAN) injection 4 mg, 4 mg, Intravenous, Q6H PRN, Cox, Amy N, DO   [START ON 11/27/2021] pantoprazole (PROTONIX) injection 40 mg, 40 mg, Intravenous, Q12H, Cox, Amy N, DO   pantoprozole (PROTONIX) 80 mg /NS 100 mL infusion, 8 mg/hr, Intravenous, Continuous, Cox, Amy N, DO, Stopped at 11/10/2021 1146   phytonadione (VITAMIN K) 10 mg in dextrose 5 % 50 mL IVPB, 10 mg, Intravenous, Once, KeeDarel Hong NP   propofol (DIPRIVAN) 1000 MG/100ML infusion, 5-80 mcg/kg/min, Intravenous, Titrated, KeeDarel Hong NP, Last Rate: 36.6 mL/hr at 11/30/2021 1559, 50 mcg/kg/min at 11/22/2021 1559   sodium chloride  flush (NS) 0.9 % injection 10 mL, 10 mL, Intracatheter, PRN, AllVerlon AuP   sodium chloride flush (NS) 0.9 % injection 10 mL, 10 mL, Intracatheter, PRN, AllVerlon AuP   sodium chloride flush (NS) 0.9 % injection 3 mL, 3 mL, Intracatheter, PRN, AllVerlon AuP   sodium chloride flush (NS) 0.9 % injection 3 mL, 3 mL, Intracatheter, PRN, AllVerlon AuP   Physical exam:  Vitals:   11/21/2021 1406 11/09/2021 1430 11/12/2021 1500 11/27/2021 1530  BP:  (!) 106/51 (!) 108/51 (!) 108/55  Pulse:      Resp:  (!) 26 (!) 25 (!) 21  Temp: 98.8 F (37.1 C) 98.8 F (37.1 C)    TempSrc: Oral Oral    SpO2:  99%    Weight:      Height:       Physical Exam Neck:     Comments: S/p tach Cardiovascular:  Rate and Rhythm: Normal rate.     Pulses: Normal pulses.  Pulmonary:     Comments: On Ventilator  Abdominal:     General: There is no distension.  Lymphadenopathy:     Cervical: Cervical adenopathy present.  Skin:    General: Skin is warm.       Labs    Latest Ref Rng & Units 11/27/2021   10:14 AM 11/22/2021    8:23 AM 11/30/2021    3:11 AM  CBC  WBC 4.0 - 10.5 K/uL 9.6  10.9  12.8   Hemoglobin 13.0 - 17.0 g/dL 9.1  11.2  12.4   Hematocrit 39.0 - 52.0 % 27.2  34.0  37.2   Platelets 150 - 400 K/uL 45  57  78       Latest Ref Rng & Units 11/16/2021    3:11 AM 11/12/2021    2:22 PM 11/08/2021    1:56 PM  CMP  Glucose 70 - 99 mg/dL 144  111  111   BUN 6 - 20 mg/dL 32  22  12   Creatinine 0.61 - 1.24 mg/dL 2.29  1.40  0.92   Sodium 135 - 145 mmol/L 144  144  137   Potassium 3.5 - 5.1 mmol/L 5.1  5.5  4.0   Chloride 98 - 111 mmol/L 103  106  103   CO2 22 - 32 mmol/L '29  29  30   '$ Calcium 8.9 - 10.3 mg/dL 12.9  12.2  10.4   Total Protein 6.5 - 8.1 g/dL 6.8  6.1  6.8   Total Bilirubin 0.3 - 1.2 mg/dL 4.9  4.4  2.9   Alkaline Phos 38 - 126 U/L 104  89  123   AST 15 - 41 U/L 80  68  67   ALT 0 - 44 U/L 34  30  31      RADIOGRAPHIC STUDIES: I have personally  reviewed the radiological images as listed and agreed with the findings in the report. CT Soft Tissue Neck W Contrast  Result Date: 11/18/2021 CLINICAL DATA:  Neck mass EXAM: CT NECK WITH CONTRAST TECHNIQUE: Multidetector CT imaging of the neck was performed using the standard protocol following the bolus administration of intravenous contrast. RADIATION DOSE REDUCTION: This exam was performed according to the departmental dose-optimization program which includes automated exposure control, adjustment of the mA and/or kV according to patient size and/or use of iterative reconstruction technique. CONTRAST:  74m OMNIPAQUE IOHEXOL 300 MG/ML  SOLN COMPARISON:  None Available. FINDINGS: Pharynx and larynx: Mucosal mass involving the right supraglottic airway extending into the airway and causing significant airway narrowing. Mass is irregular and extends down to the vocal cords and could involve the right vocal cord. Epicenter is above the vocal cord. Mass extends up to the right lateral oropharynx. Salivary glands: No inflammation, mass, or stone. Right submandibular gland surrounded by malignant lymph nodes. Thyroid: Negative Lymph nodes: Malignant adenopathy in neck bilaterally right greater than left. Large conglomeration of lymph nodes throughout the right neck, many of which have necrosis. Right submandibular necrotic nodes measuring 15 mm and 14 mm. Right level 2 lymph nodes 29 mm and 20 mm. Right level 3 lymph nodes 15 mm and 21 mm and 17 mm. Right level 4 lymph nodes 17 mm and 28 mm. Submental lymph node to the right of midline 17 mm. Small necrotic left level 2 lymph node 9 mm. Posterior lymph nodes on the left measuring 14 mm and 13 mm.  Additional smaller lymph nodes on the left. Vascular: Right jugular vein is compressed by the large lymph node mass on the right. No thrombus. Left jugular vein narrowed but patent. Limited intracranial: Negative Visualized orbits: Negative Mastoids and visualized  paranasal sinuses: Mucosal edema paranasal sinuses. No air-fluid level. Mastoid clear. Skeleton: No acute abnormality. Upper chest: 5 mm nodule in the superior segment of the right lower lobe. 3 mm nodule left upper lobe. Other: None IMPRESSION: 1. 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 Mass extends down to the vocal cords and could involve the right vocal cord. Mass extends up to the right lateral oropharynx. 2. Malignant adenopathy in the neck bilaterally right greater than left. Largest lymph node mass is in the right neck measuring 29 mm and 20 mm. 3. 5 mm nodule superior segment right lower lobe and 3 mm nodule left upper lobe. Metastatic disease is possible. Chest CT recommended. 4. Right jugular vein is compressed by the large lymph node mass on the right. No thrombus. Left jugular vein narrowed but patent. 5. These results were called by telephone at the time of interpretation on 11/17/2021 at 3:49 pm to provider [facility], who verbally acknowledged these results. Electronically Signed   By: Franchot Gallo M.D.   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 Follow up on biopsy results.  Needs staging imaging when he is stabilized.   # Coagulopathy and thrombocytopenia due to advance liver cirrhosis.  S/p platelet transfusion, cryoprecipitate, FFP.  He is now s/p procedure. Close monitor.  If he develops active bleeding, check fibrinogen, aim 200. give cryoprecipitate 10 units if level is 100-200. If < 100, give 20 units. If fibrinogen > 200 with prolonged PT/PTT and active bleeding, give FFP 10 ml/kg.    # Advanced liver cirrhosis, GI is on board.  # Hypercalcemia, likely due to malignancy. Recommend 1 dose of pamidronate.  # AKI continue IVF hydration. , avoid nephrotoxins.    Thank you for  allowing me to participate in the care of this patient.   Earlie Server, MD, PhD Hematology Oncology 11/25/2021

## 2021-11-24 NOTE — Progress Notes (Signed)
Unit of blood sent back to blood bank, patient blood number does not match new type and screen . Initial blood bracelet was no where to be found on patient. New bracelet placed and typed and screen sent. Discussed with blood bank and charge nurse. Will administer blood as soon as available

## 2021-11-24 NOTE — Progress Notes (Signed)
An USGPIV (ultrasound guided PIV) on LFA 20g x 1.88" has been placed for short-term vasopressor infusion. A correctly placed ivWatch must be used when administering Vasopressors. Should this treatment be needed beyond 72 hours, central line access should be obtained.  It will be the responsibility of the bedside nurse to follow best practice to prevent extravasations.

## 2021-11-24 NOTE — Progress Notes (Signed)
..11/22/2021 8:34 AM  Jordan Hayden [id number]   Temp:  [97.7 F (36.5 C)-98.3 F (36.8 C)] 97.7 F (36.5 C) (11/17 0400) Pulse Rate:  [91-101] 100 (11/17 0600) Resp:  [9-20] 15 (11/17 0600) BP: (84-121)/(42-86) 119/78 (11/17 0600) SpO2:  [93 %-100 %] 93 % (11/17 0600) Weight:  [122.1 kg-123.9 kg] 122.1 kg (11/16 2000),     Intake/Output Summary (Last 24 hours) at 11/11/2021 0834 Last data filed at 12/06/2021 0600 Gross per 24 hour  Intake 1138.02 ml  Output --  Net 1138.02 ml    Results for orders placed or performed during the hospital encounter of 12/02/2021 (from the past 24 hour(s))  Comprehensive metabolic panel     Status: Abnormal   Collection Time: 11/17/2021  2:22 PM  Result Value Ref Range   Sodium 144 135 - 145 mmol/L   Potassium 5.5 (H) 3.5 - 5.1 mmol/L   Chloride 106 98 - 111 mmol/L   CO2 29 22 - 32 mmol/L   Glucose, Bld 111 (H) 70 - 99 mg/dL   BUN 22 (H) 6 - 20 mg/dL   Creatinine, Ser 1.40 (H) 0.61 - 1.24 mg/dL   Calcium 12.2 (H) 8.9 - 10.3 mg/dL   Total Protein 6.1 (L) 6.5 - 8.1 g/dL   Albumin 2.1 (L) 3.5 - 5.0 g/dL   AST 68 (H) 15 - 41 U/L   ALT 30 0 - 44 U/L   Alkaline Phosphatase 89 38 - 126 U/L   Total Bilirubin 4.4 (H) 0.3 - 1.2 mg/dL   GFR, Estimated 60 (L) >60 mL/min   Anion gap 9 5 - 15  CBC with Differential     Status: Abnormal   Collection Time: 11/22/2021  2:22 PM  Result Value Ref Range   WBC 6.5 4.0 - 10.5 K/uL   RBC 3.78 (L) 4.22 - 5.81 MIL/uL   Hemoglobin 12.6 (L) 13.0 - 17.0 g/dL   HCT 38.8 (L) 39.0 - 52.0 %   MCV 102.6 (H) 80.0 - 100.0 fL   MCH 33.3 26.0 - 34.0 pg   MCHC 32.5 30.0 - 36.0 g/dL   RDW 17.5 (H) 11.5 - 15.5 %   Platelets 55 (L) 150 - 400 K/uL   nRBC 0.5 (H) 0.0 - 0.2 %   Neutrophils Relative % 67 %   Neutro Abs 4.4 1.7 - 7.7 K/uL   Lymphocytes Relative 24 %   Lymphs Abs 1.5 0.7 - 4.0 K/uL   Monocytes Relative 7 %   Monocytes Absolute 0.4 0.1 - 1.0 K/uL   Eosinophils Relative 2 %   Eosinophils Absolute 0.1 0.0  - 0.5 K/uL   Basophils Relative 0 %   Basophils Absolute 0.0 0.0 - 0.1 K/uL   Immature Granulocytes 0 %   Abs Immature Granulocytes 0.02 0.00 - 0.07 K/uL  Protime-INR     Status: Abnormal   Collection Time: 11/22/2021  2:22 PM  Result Value Ref Range   Prothrombin Time 25.3 (H) 11.4 - 15.2 seconds   INR 2.3 (H) 0.8 - 1.2  Ammonia     Status: Abnormal   Collection Time: 11/22/2021  2:22 PM  Result Value Ref Range   Ammonia 48 (H) 9 - 35 umol/L  Magnesium     Status: Abnormal   Collection Time: 11/10/2021  2:22 PM  Result Value Ref Range   Magnesium 2.6 (H) 1.7 - 2.4 mg/dL  Type and screen [facility]     Status: None (Preliminary result)   Collection Time:  11/19/2021  2:24 PM  Result Value Ref Range   ABO/RH(D) PENDING    Antibody Screen PENDING    Sample Expiration      11/26/2021,2359 Performed at [facility], [address] [place], [place] [address]   Prepare platelet pheresis     Status: None   Collection Time: 11/28/2021  5:02 PM  Result Value Ref Range   Unit Number [id number]    Blood Component Type PLTP1 PSORALEN TREATED    Unit division 00    Status of Unit ISSUED,FINAL    Transfusion Status      OK TO TRANSFUSE Performed at [facility], [address] [place], [place] [address]   MRSA Next Gen by PCR, Nasal     Status: None   Collection Time: 12/05/2021  8:00 PM   Specimen: Nasal Mucosa; Nasal Swab  Result Value Ref Range   MRSA by PCR Next Gen NOT DETECTED NOT DETECTED  Glucose, capillary     Status: None   Collection Time: 12/02/2021  8:00 PM  Result Value Ref Range   Glucose-Capillary 88 70 - 99 mg/dL  Type and screen Ordered by PROVIDER DEFAULT     Status: None   Collection Time: 11/11/2021  8:10 PM  Result Value Ref Range   ABO/RH(D) B POS    Antibody Screen NEG    Sample Expiration      11/26/2021,2359 Performed at [facility], [address] [place], [place] [address]   CBC     Status: Abnormal    Collection Time: 11/18/2021  8:10 PM  Result Value Ref Range   WBC 9.1 4.0 - 10.5 K/uL   RBC 3.60 (L) 4.22 - 5.81 MIL/uL   Hemoglobin 12.2 (L) 13.0 - 17.0 g/dL   HCT 36.2 (L) 39.0 - 52.0 %   MCV 100.6 (H) 80.0 - 100.0 fL   MCH 33.9 26.0 - 34.0 pg   MCHC 33.7 30.0 - 36.0 g/dL   RDW 17.4 (H) 11.5 - 15.5 %   Platelets 87 (L) 150 - 400 K/uL   nRBC 0.8 (H) 0.0 - 0.2 %  Fibrinogen     Status: Abnormal   Collection Time: 11/27/2021  8:10 PM  Result Value Ref Range   Fibrinogen 149 (L) 210 - 475 mg/dL  CBC     Status: Abnormal   Collection Time: 11/13/2021 11:20 PM  Result Value Ref Range   WBC 12.1 (H) 4.0 - 10.5 K/uL   RBC 3.76 (L) 4.22 - 5.81 MIL/uL   Hemoglobin 12.8 (L) 13.0 - 17.0 g/dL   HCT 38.5 (L) 39.0 - 52.0 %   MCV 102.4 (H) 80.0 - 100.0 fL   MCH 34.0 26.0 - 34.0 pg   MCHC 33.2 30.0 - 36.0 g/dL   RDW 17.7 (H) 11.5 - 15.5 %   Platelets 80 (L) 150 - 400 K/uL   nRBC 0.5 (H) 0.0 - 0.2 %  Basic metabolic panel     Status: Abnormal   Collection Time: 12/05/2021  3:11 AM  Result Value Ref Range   Sodium 144 135 - 145 mmol/L   Potassium 5.1 3.5 - 5.1 mmol/L   Chloride 103 98 - 111 mmol/L   CO2 29 22 - 32 mmol/L   Glucose, Bld 144 (H) 70 - 99 mg/dL   BUN 32 (H) 6 - 20 mg/dL   Creatinine, Ser 2.29 (H) 0.61 - 1.24 mg/dL   Calcium 12.9 (H) 8.9 - 10.3 mg/dL   GFR, Estimated 33 (  L) >60 mL/min   Anion gap 12 5 - 15  CBC     Status: Abnormal   Collection Time: 11/20/2021  3:11 AM  Result Value Ref Range   WBC 12.8 (H) 4.0 - 10.5 K/uL   RBC 3.64 (L) 4.22 - 5.81 MIL/uL   Hemoglobin 12.4 (L) 13.0 - 17.0 g/dL   HCT 37.2 (L) 39.0 - 52.0 %   MCV 102.2 (H) 80.0 - 100.0 fL   MCH 34.1 (H) 26.0 - 34.0 pg   MCHC 33.3 30.0 - 36.0 g/dL   RDW 17.6 (H) 11.5 - 15.5 %   Platelets 78 (L) 150 - 400 K/uL   nRBC 0.5 (H) 0.0 - 0.2 %  Hepatic function panel     Status: Abnormal   Collection Time: 11/16/2021  3:11 AM  Result Value Ref Range   Total Protein 6.8 6.5 - 8.1 g/dL   Albumin 2.3 (L) 3.5 - 5.0 g/dL    AST 80 (H) 15 - 41 U/L   ALT 34 0 - 44 U/L   Alkaline Phosphatase 104 38 - 126 U/L   Total Bilirubin 4.9 (H) 0.3 - 1.2 mg/dL   Bilirubin, Direct 2.3 (H) 0.0 - 0.2 mg/dL   Indirect Bilirubin 2.6 (H) 0.3 - 0.9 mg/dL    SUBJECTIVE:  Required multiple IVs last night as kept pulling them out.  More cooperative this morning.  No further bleeding episodes.  Patient denies any breathing issues this morning.  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  Appreciate Medicine and Oncology assistance with coagulopathy/thrombocytopenia correction in preparation for surgery.  Discussed altered status last night with Anesthesia but improved this morning.  Jordan Hayden 12/06/2021, 8:34 AM

## 2021-11-24 NOTE — Anesthesia Preprocedure Evaluation (Addendum)
Anesthesia Evaluation  Patient identified by MRN, date of birth, ID band Patient awake    Reviewed: Allergy & Precautions, NPO status , Patient's Chart, lab work & pertinent test results  Airway Mallampati: III  TM Distance: >3 FB Neck ROM: full    Dental  (+) Chipped   Pulmonary sleep apnea    Pulmonary exam normal        Cardiovascular Exercise Tolerance: Poor Normal cardiovascular exam     Neuro/Psych negative neurological ROS  negative psych ROS   GI/Hepatic ,GERD  ,,(+) Cirrhosis       Alcohol related cirrhosis of the liver and thrombocytopenia worsening dysphagia  For possible Variceal bleed- Octreotide bolus and gtt. with Protonix bolus and gtt. ordered   Endo/Other    Morbid obesity  Renal/GU      Musculoskeletal   Abdominal   Peds  Hematology  (+) Blood dyscrasia, anemia IDA   Anesthesia Other Findings Right sided neck mass and thrombocytopenia with hematemesis yesterday. Decadron therapy initiated.  Encephalopathy overnight with slight confusion off and on today. Ammonia slightly elevated. Possible encephalopathy from  Per Heme/Onc Thrombocytopenia- secondary to cirrhosis. On admission, platelets were stable at 55 however, with plan for surgery tomorrow, goal ~ 100. Hospitalist is coordinating transfusion overnight. Will check fibrinogen. Goal fibrinogen 200. Plan is if 100-200, give cryoprecipitate, 10 units. If < 100, give 20 units. If fibrinogen > 200 with prolonged PT/PTT, give FFP 10 ml/kg  Reports approximately 100 lb weight loss over past year. Denies pain or shortness of breath. Endorses changes in dentition and vocal changes with increased hoarseness  CT 11/17/2021- CT Soft Tissue Neck W Contrast-  IMPRESSION: 1. 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 Mass extends down to the vocal cords and could involve the right  vocal cord. Mass extends up to the right lateral oropharynx. 2. Malignant adenopathy in the neck bilaterally right greater than left. Largest lymph node mass is in the right neck measuring 29 mm and 20 mm. 3. 5 mm nodule superior segment right lower lobe and 3 mm nodule left upper lobe. Metastatic disease is possible. Chest CT recommended. 4. Right jugular vein is compressed by the large lymph node mass on the right. No thrombus. Left jugular vein narrowed but patent.   hematemesis on 11/22/2021  Past Medical History: No date: Alcohol abuse No date: Alcoholic cirrhosis ([place]) No date: Alcoholism ([place]) No date: Arthritis No date: Cirrhosis ([place])  Past Surgical History: 07/01/2017: ESOPHAGOGASTRODUODENOSCOPY (EGD) WITH PROPOFOL; N/A     Comment:  Procedure: ESOPHAGOGASTRODUODENOSCOPY (EGD) WITH               PROPOFOL;  Surgeon: Lucilla Lame, MD;  Location: [facility]               ENDOSCOPY;  Service: Endoscopy;  Laterality: N/A;  BMI    Body Mass Index: 40.93 kg/m      Reproductive/Obstetrics negative OB ROS                              Anesthesia Physical Anesthesia Plan  ASA: 4  Anesthesia Plan: MAC   Post-op Pain Management:    Induction:   PONV Risk Score and Plan:   Airway Management Planned:   Additional Equipment:   Intra-op Plan:   Post-operative Plan:   Informed Consent:   Plan Discussed with: Anesthesiologist, CRNA and Surgeon  Anesthesia Plan Comments:  Anesthesia Quick Evaluation  

## 2021-11-24 NOTE — Progress Notes (Signed)
[place] [facility] [place] NAME: Jordan Hayden    MR#:  [id number]  DATE OF BIRTH:  08-17-1965  SUBJECTIVE:  patient was seen earlier in the ICU. Was admitted after started having hoarse voice and difficulty with respiration. Found to have large right sided neck mass causing airway compromise. Patient was seen by ENT Dr. Pryor Ochoa and underwent surgery with biopsy and tracheostomy. Patient will be transferred to ICU service discussed with ICU attending and [facility] nurse practitioner. Evaluated again. Patient is sedated on propofol drip. Patient on broad-spectrum antibiotic. Patient has history of liver cirrhosis and episode of hematemesis. Received platelets, chiropractic prior to surgery  VITALS:  Blood pressure (!) 105/91, pulse 86, temperature 98.2 F (36.8 C), temperature source Oral, resp. rate 15, height '5\' 8"'$  (1.727 m), weight 122.1 kg, SpO2 96 %.  PHYSICAL EXAMINATION:   GENERAL:  56 y.o.-year-old patient lying in the bed with no acute distress. Critically ill, obese Trach + on vent LUNGS: Normal breath sounds bilaterally, no wheezing CARDIOVASCULAR: S1, S2 normal. No murmurs,   ABDOMEN: Soft, nontender, nondistended EXTREMITIES: No  edema b/l.    NEUROLOGIC: currently on the ventilator. Sedated.  SKIN: per RN  LABORATORY PANEL:  CBC Recent Labs  Lab 11/19/2021 1014  WBC 9.6  HGB 9.1*  HCT 27.2*  PLT 45*    Chemistries  Recent Labs  Lab 11/20/2021 1422 12/07/2021 0311  NA 144 144  K 5.5* 5.1  CL 106 103  CO2 29 29  GLUCOSE 111* 144*  BUN 22* 32*  CREATININE 1.40* 2.29*  CALCIUM 12.2* 12.9*  MG 2.6*  --   AST 68* 80*  ALT 30 34  ALKPHOS 89 104  BILITOT 4.4* 4.9*   Cardiac Enzymes No results for input(s): "TROPONINI" in the last 168 hours. RADIOLOGY:  CT Soft Tissue Neck W Contrast  Result Date: 11/19/2021 CLINICAL DATA:  Neck mass EXAM: CT NECK WITH CONTRAST TECHNIQUE: Multidetector CT imaging of the neck was  performed using the standard protocol following the bolus administration of intravenous contrast. RADIATION DOSE REDUCTION: This exam was performed according to the departmental dose-optimization program which includes automated exposure control, adjustment of the mA and/or kV according to patient size and/or use of iterative reconstruction technique. CONTRAST:  37m OMNIPAQUE IOHEXOL 300 MG/ML  SOLN COMPARISON:  None Available. FINDINGS: Pharynx and larynx: Mucosal mass involving the right supraglottic airway extending into the airway and causing significant airway narrowing. Mass is irregular and extends down to the vocal cords and could involve the right vocal cord. Epicenter is above the vocal cord. Mass extends up to the right lateral oropharynx. Salivary glands: No inflammation, mass, or stone. Right submandibular gland surrounded by malignant lymph nodes. Thyroid: Negative Lymph nodes: Malignant adenopathy in neck bilaterally right greater than left. Large conglomeration of lymph nodes throughout the right neck, many of which have necrosis. Right submandibular necrotic nodes measuring 15 mm and 14 mm. Right level 2 lymph nodes 29 mm and 20 mm. Right level 3 lymph nodes 15 mm and 21 mm and 17 mm. Right level 4 lymph nodes 17 mm and 28 mm. Submental lymph node to the right of midline 17 mm. Small necrotic left level 2 lymph node 9 mm. Posterior lymph nodes on the left measuring 14 mm and 13 mm. Additional smaller lymph nodes on the left. Vascular: Right jugular vein is compressed by the large lymph node mass on the right. No thrombus. Left jugular vein narrowed but patent.  Limited intracranial: Negative Visualized orbits: Negative Mastoids and visualized paranasal sinuses: Mucosal edema paranasal sinuses. No air-fluid level. Mastoid clear. Skeleton: No acute abnormality. Upper chest: 5 mm nodule in the superior segment of the right lower lobe. 3 mm nodule left upper lobe. Other: None IMPRESSION: 1. Large  mucosal mass in the right supraglottic airway extending into the airway causing significant airway narrowing. Mass extends down to the vocal cords and could involve the right vocal cord. Mass extends up to the right lateral oropharynx. 2. Malignant adenopathy in the neck bilaterally right greater than left. Largest lymph node mass is in the right neck measuring 29 mm and 20 mm. 3. 5 mm nodule superior segment right lower lobe and 3 mm nodule left upper lobe. Metastatic disease is possible. Chest CT recommended. 4. Right jugular vein is compressed by the large lymph node mass on the right. No thrombus. Left jugular vein narrowed but patent. 5. These results were called by telephone at the time of interpretation on 12/02/2021 at 3:49 pm to provider [facility], who verbally acknowledged these results. Electronically Signed   By: Franchot Gallo M.D.   On: 11/18/2021 15:50    Assessment and Plan  Jordan Hayden is a 56 year old male with history of advanced alcohol associated liver cirrhosis, alcohol abuse currently in remission, last etoh drink was six years ago, thrombocytopenia, iron deficiency anemia, morbid obesity, who presents to the ED for chief concern of nausea and vomiting blood.   Large Right side Neck mass with airway compromise Likely malignancy, rule out infection - IV rocephin, Flagyl-- de-escalate once biopsy results are available - IV decadron - ENT has been consulted apt is s/p tracheostomy and bipsoy for right neck mass arising from tonsil   Hemoptysis -  discussed case with GI service, Dr. Virgina Jock does not believe the patient is bleeding from a variceal bleed given the CT imaging - However with his history of advanced liver disease, he recommends octreotide and Protonix gtt. along with antibiotic - Octreotide  gtt. with Protonix gtt for now   Thrombocytopenia (HCC) Macrocytosis - Goal platelet is greater than 100 - Hematology/oncology has been consulted - patient received two units  of platelet and 10 units of cryo- precedented in preparation for tracheostomy surgery.  -- Oncology will follow biopsy results given neck mass  Morbid obesity with BMI of 40.0-44.9, adult ([place]) - This complicates overall care and prognosis.    Hyperbilirubinemia due to liver cirrhosis Advanced cirrhosis of liver ([place]) - Presumed secondary to inability of patient to tolerate p.o. intake therefore has not been taking his spironolactone, lactulose, rifaximin   acute renal failure/ATN in the setting of dehydration/hypovolemia with vomiting and poor PO intake for last several days -- continue IV hydration -- monitor input output -- baseline creatinine 0.92 (11/2021) -- came in with creatinine 1.4-- 2.29 -- avoid nephrotoxic agents   DVT prophylaxis-pharmacologic DVT prophylaxis not ordered on admission due to thrombocytopenia and hemoptysis   patient is critically ill. Will transfer to [place] service.   DVT prophylaxis: TED hose Code Status: Full code Diet: Strict n.p.o. Family Communication: family is aware  disposition Plan: Pending clinical course Consults called: ENT, gastroenterology, oncology Admission status: ICU inpatient     TOTAL criticalTIME TAKING CARE OF THIS PATIENT: 40 minutes.  >50% time spent on counselling and coordination of care  Note: This dictation was prepared with Dragon dictation along with smaller phrase technology. Any transcriptional errors that result from this process are unintentional.  Deonna Krummel  Posey Pronto M.D    [facility]   CC: Primary care physician; Martrell Body, MD

## 2021-11-25 ENCOUNTER — Inpatient Hospital Stay: Payer: Medicare Other

## 2021-11-25 DIAGNOSIS — R221 Localized swelling, mass and lump, neck: Secondary | ICD-10-CM | POA: Diagnosis not present

## 2021-11-25 LAB — PREPARE RBC (CROSSMATCH)

## 2021-11-25 LAB — PREPARE FRESH FROZEN PLASMA: Unit division: 0

## 2021-11-25 LAB — CBC
HCT: 23.7 % — ABNORMAL LOW (ref 39.0–52.0)
Hemoglobin: 8 g/dL — ABNORMAL LOW (ref 13.0–17.0)
MCH: 33.9 pg (ref 26.0–34.0)
MCHC: 33.8 g/dL (ref 30.0–36.0)
MCV: 100.4 fL — ABNORMAL HIGH (ref 80.0–100.0)
Platelets: 72 10*3/uL — ABNORMAL LOW (ref 150–400)
RBC: 2.36 MIL/uL — ABNORMAL LOW (ref 4.22–5.81)
RDW: 17.8 % — ABNORMAL HIGH (ref 11.5–15.5)
WBC: 7.6 10*3/uL (ref 4.0–10.5)
nRBC: 0.5 % — ABNORMAL HIGH (ref 0.0–0.2)

## 2021-11-25 LAB — TYPE AND SCREEN
ABO/RH(D): B POS
Antibody Screen: NEGATIVE
Unit division: 0

## 2021-11-25 LAB — BPAM FFP
Blood Product Expiration Date: 202311192359
Blood Product Expiration Date: 202311192359
Blood Product Expiration Date: 202311222359
ISSUE DATE / TIME: 202311171201
ISSUE DATE / TIME: 202311171625
ISSUE DATE / TIME: 202311171730
Unit Type and Rh: 7300
Unit Type and Rh: 7300
Unit Type and Rh: 7300

## 2021-11-25 LAB — BPAM PLATELET PHERESIS
Blood Product Expiration Date: 202311182359
Blood Product Expiration Date: 202311182359
Blood Product Expiration Date: 202311182359
ISSUE DATE / TIME: 202311171010
ISSUE DATE / TIME: 202311171612
ISSUE DATE / TIME: 202311171658
Unit Type and Rh: 6200
Unit Type and Rh: 5100
Unit Type and Rh: 6200

## 2021-11-25 LAB — GLUCOSE, CAPILLARY
Glucose-Capillary: 114 mg/dL — ABNORMAL HIGH (ref 70–99)
Glucose-Capillary: 126 mg/dL — ABNORMAL HIGH (ref 70–99)
Glucose-Capillary: 130 mg/dL — ABNORMAL HIGH (ref 70–99)
Glucose-Capillary: 144 mg/dL — ABNORMAL HIGH (ref 70–99)
Glucose-Capillary: 147 mg/dL — ABNORMAL HIGH (ref 70–99)
Glucose-Capillary: 154 mg/dL — ABNORMAL HIGH (ref 70–99)

## 2021-11-25 LAB — PROTIME-INR
INR: 1.9 — ABNORMAL HIGH (ref 0.8–1.2)
Prothrombin Time: 21.3 seconds — ABNORMAL HIGH (ref 11.4–15.2)

## 2021-11-25 LAB — BPAM RBC
Blood Product Expiration Date: 202312022359
ISSUE DATE / TIME: 202311172338
Unit Type and Rh: 7300

## 2021-11-25 LAB — BASIC METABOLIC PANEL
Anion gap: 8 (ref 5–15)
Anion gap: 12 (ref 5–15)
BUN: 43 mg/dL — ABNORMAL HIGH (ref 6–20)
BUN: 43 mg/dL — ABNORMAL HIGH (ref 6–20)
CO2: 29 mmol/L (ref 22–32)
CO2: 25 mmol/L (ref 22–32)
Calcium: 12.1 mg/dL — ABNORMAL HIGH (ref 8.9–10.3)
Calcium: 12.5 mg/dL — ABNORMAL HIGH (ref 8.9–10.3)
Chloride: 109 mmol/L (ref 98–111)
Chloride: 109 mmol/L (ref 98–111)
Creatinine, Ser: 1.71 mg/dL — ABNORMAL HIGH (ref 0.61–1.24)
Creatinine, Ser: 1.45 mg/dL — ABNORMAL HIGH (ref 0.61–1.24)
GFR, Estimated: 47 mL/min — ABNORMAL LOW (ref >60)
GFR, Estimated: 57 mL/min — ABNORMAL LOW (ref >60)
Glucose, Bld: 150 mg/dL — ABNORMAL HIGH (ref 70–99)
Glucose, Bld: 171 mg/dL — ABNORMAL HIGH (ref 70–99)
Potassium: 3.9 mmol/L (ref 3.5–5.1)
Potassium: 3.8 mmol/L (ref 3.5–5.1)
Sodium: 146 mmol/L — ABNORMAL HIGH (ref 135–145)
Sodium: 146 mmol/L — ABNORMAL HIGH (ref 135–145)

## 2021-11-25 LAB — PREPARE PLATELET PHERESIS
Unit division: 0
Unit division: 0
Unit division: 0

## 2021-11-25 LAB — HEPATIC FUNCTION PANEL
ALT: 29 U/L (ref 0–44)
AST: 74 U/L — ABNORMAL HIGH (ref 15–41)
Albumin: 3.2 g/dL — ABNORMAL LOW (ref 3.5–5.0)
Alkaline Phosphatase: 73 U/L (ref 38–126)
Bilirubin, Direct: 2.2 mg/dL — ABNORMAL HIGH (ref 0.0–0.2)
Indirect Bilirubin: 2.7 mg/dL — ABNORMAL HIGH (ref 0.3–0.9)
Total Bilirubin: 4.9 mg/dL — ABNORMAL HIGH (ref 0.3–1.2)
Total Protein: 6 g/dL — ABNORMAL LOW (ref 6.5–8.1)

## 2021-11-25 LAB — TRIGLYCERIDES: Triglycerides: 107 mg/dL (ref <150)

## 2021-11-25 LAB — PHOSPHORUS: Phosphorus: 2.9 mg/dL (ref 2.5–4.6)

## 2021-11-25 LAB — PREPARE CRYOPRECIPITATE: Unit division: 0

## 2021-11-25 LAB — BPAM CRYOPRECIPITATE
Blood Product Expiration Date: 202311172202
ISSUE DATE / TIME: 202311171815
Unit Type and Rh: 5100

## 2021-11-25 LAB — FIBRINOGEN: Fibrinogen: 392 mg/dL (ref 210–475)

## 2021-11-25 LAB — MAGNESIUM: Magnesium: 2.6 mg/dL — ABNORMAL HIGH (ref 1.7–2.4)

## 2021-11-25 LAB — AMMONIA: Ammonia: 32 umol/L (ref 9–35)

## 2021-11-25 MED ORDER — VITAMIN K1 10 MG/ML IJ SOLN
5.0000 mg | Freq: Every day | INTRAVENOUS | Status: AC
  Administered 2021-11-25 – 2021-11-29 (×5): 5 mg via INTRAVENOUS
  Filled 2021-11-25 (×5): qty 0.5

## 2021-11-25 MED ORDER — MIDAZOLAM-SODIUM CHLORIDE 100-0.9 MG/100ML-% IV SOLN
0.5000 mg/h | INTRAVENOUS | Status: DC
  Administered 2021-11-25: 1 mg/h via INTRAVENOUS
  Administered 2021-11-27: 2 mg/h via INTRAVENOUS
  Filled 2021-11-25 (×2): qty 100

## 2021-11-25 NOTE — Progress Notes (Signed)
..11/25/2021 8:21 AM  Jordan Hayden [id number]  Post-Op Day 1    Temp:  [95.2 F (35.1 C)-99 F (37.2 C)] 98.4 F (36.9 C) (11/18 0745) Pulse Rate:  [51-95] 57 (11/18 0745) Resp:  [14-33] 16 (11/18 0745) BP: (68-170)/(29-98) 107/51 (11/18 0730) SpO2:  [94 %-100 %] 98 % (11/18 0745) Arterial Line BP: (81-134)/(44-55) 107/45 (11/18 0745) FiO2 (%):  [40 %] 40 % (11/18 0730),     Intake/Output Summary (Last 24 hours) at 11/25/2021 0821 Last data filed at 11/25/2021 0600 Gross per 24 hour  Intake 7308.56 ml  Output 990 ml  Net 6318.56 ml    Results for orders placed or performed during the hospital encounter of 12/07/2021 (from the past 24 hour(s))  CBC     Status: Abnormal   Collection Time: 11/15/2021  8:23 AM  Result Value Ref Range   WBC 10.9 (H) 4.0 - 10.5 K/uL   RBC 3.32 (L) 4.22 - 5.81 MIL/uL   Hemoglobin 11.2 (L) 13.0 - 17.0 g/dL   HCT 34.0 (L) 39.0 - 52.0 %   MCV 102.4 (H) 80.0 - 100.0 fL   MCH 33.7 26.0 - 34.0 pg   MCHC 32.9 30.0 - 36.0 g/dL   RDW 17.8 (H) 11.5 - 15.5 %   Platelets 57 (L) 150 - 400 K/uL   nRBC 0.4 (H) 0.0 - 0.2 %  Protime-INR     Status: Abnormal   Collection Time: 11/10/2021  8:23 AM  Result Value Ref Range   Prothrombin Time 25.0 (H) 11.4 - 15.2 seconds   INR 2.3 (H) 0.8 - 1.2  Prepare cryoprecipitate     Status: None (Preliminary result)   Collection Time: 11/14/2021  9:46 AM  Result Value Ref Range   Unit Number [id number]    Blood Component Type CRYPOOL THAW    Unit division 00    Status of Unit ISSUED    Transfusion Status      OK TO TRANSFUSE Performed at [facility], [address] [place], [place] [address]    Unit Number [id number]    Blood Component Type CRYPOOL THAW    Unit division 00    Status of Unit ISSUED    Transfusion Status OK TO TRANSFUSE   Prepare platelet pheresis     Status: None (Preliminary result)   Collection Time: 11/10/2021  9:46 AM  Result Value Ref Range   Unit Number [id number]     Blood Component Type PLTP1 PSORALEN TREATED    Unit division 00    Status of Unit ISSUED    Transfusion Status      OK TO TRANSFUSE Performed at [facility], [address] [place], [place] [address]   CBC     Status: Abnormal   Collection Time: 11/11/2021 10:14 AM  Result Value Ref Range   WBC 9.6 4.0 - 10.5 K/uL   RBC 2.68 (L) 4.22 - 5.81 MIL/uL   Hemoglobin 9.1 (L) 13.0 - 17.0 g/dL   HCT 27.2 (L) 39.0 - 52.0 %   MCV 101.5 (H) 80.0 - 100.0 fL   MCH 34.0 26.0 - 34.0 pg   MCHC 33.5 30.0 - 36.0 g/dL   RDW 17.8 (H) 11.5 - 15.5 %   Platelets 45 (L) 150 - 400 K/uL   nRBC 0.4 (H) 0.0 - 0.2 %  Protime-INR     Status: Abnormal   Collection Time: 11/17/2021 10:14 AM  Result Value Ref Range   Prothrombin Time 30.8 (H) 11.4 - 15.2  seconds   INR 3.0 (H) 0.8 - 1.2  APTT     Status: Abnormal   Collection Time: 12/07/2021 10:14 AM  Result Value Ref Range   aPTT 41 (H) 24 - 36 seconds  Sample to Blood Bank     Status: None   Collection Time: 11/09/2021 10:14 AM  Result Value Ref Range   Blood Bank Specimen SAMPLE AVAILABLE FOR TESTING    Sample Expiration      11/27/2021,2359 Performed at [facility], [address] [place], [place] [address]   Prepare fresh frozen plasma     Status: None (Preliminary result)   Collection Time: 11/22/2021 10:30 AM  Result Value Ref Range   Unit Number [id number]    Blood Component Type THW PLS APHR    Unit division A0    Status of Unit ISSUED    Transfusion Status      OK TO TRANSFUSE Performed at [facility], [address] [place], [place] [address]   Prepare platelet pheresis     Status: None (Preliminary result)   Collection Time: 11/29/2021 10:30 AM  Result Value Ref Range   Unit Number [id number]    Blood Component Type PLTP1 PSORALEN TREATED    Unit division 00    Status of Unit ISSUED    Transfusion Status OK TO TRANSFUSE   Blood gas, arterial     Status: Abnormal (Preliminary result)   Collection Time:  11/11/2021  3:49 PM  Result Value Ref Range   FIO2 40 %   Delivery systems VENTILATOR    Mode PRESSURE REGULATED VOLUME CONTROL    MECHVT 500 mL   PEEP 5 cm H20   pH, Arterial 7.45 7.35 - 7.45   pCO2 arterial 43 32 - 48 mmHg   pO2, Arterial 101 83 - 108 mmHg   Bicarbonate 29.9 (H) 20.0 - 28.0 mmol/L   Acid-Base Excess 5.2 (H) 0.0 - 2.0 mmol/L   O2 Saturation 99.2 %   Patient temperature 37.0    Collection site RIGHT RADIAL    Drawn by PENDING    Allens test (pass/fail) PASS PASS   Mechanical Rate 16   Prepare platelet pheresis     Status: None (Preliminary result)   Collection Time: 11/15/2021  3:51 PM  Result Value Ref Range   Unit Number [id number]    Blood Component Type PLTP3 PSORALEN TREATED    Unit division 00    Status of Unit ISSUED    Transfusion Status OK TO TRANSFUSE   Prepare fresh frozen plasma     Status: None (Preliminary result)   Collection Time: 11/16/2021  3:51 PM  Result Value Ref Range   Unit Number [id number]    Blood Component Type THW PLS APHR    Unit division B0    Status of Unit ISSUED    Transfusion Status OK TO TRANSFUSE    Unit Number [id number]    Blood Component Type THAWED PLASMA    Unit division 00    Status of Unit ISSUED    Transfusion Status      OK TO TRANSFUSE Performed at [facility], [address] [place], [place] [address]   Prepare cryoprecipitate     Status: None (Preliminary result)   Collection Time: 11/12/2021  3:52 PM  Result Value Ref Range   Unit Number [id number]    Blood Component Type CRYPOOL THAW    Unit division 00    Status of Unit ISSUED    Transfusion  Status      OK TO TRANSFUSE Performed at [facility], [address] [place], [place] [address]   Glucose, capillary     Status: Abnormal   Collection Time: 11/16/2021  7:54 PM  Result Value Ref Range   Glucose-Capillary 201 (H) 70 - 99 mg/dL  Sodium, urine, random     Status: None   Collection Time: 11/22/2021  7:59 PM  Result  Value Ref Range   Sodium, Ur 43 mmol/L  Creatinine, urine, random     Status: None   Collection Time: 11/09/2021  7:59 PM  Result Value Ref Range   Creatinine, Urine 244 mg/dL  APTT     Status: Abnormal   Collection Time: 12/06/2021  7:59 PM  Result Value Ref Range   aPTT 37 (H) 24 - 36 seconds  CBC     Status: Abnormal   Collection Time: 11/19/2021  7:59 PM  Result Value Ref Range   WBC 5.8 4.0 - 10.5 K/uL   RBC 2.04 (L) 4.22 - 5.81 MIL/uL   Hemoglobin 6.9 (L) 13.0 - 17.0 g/dL   HCT 20.7 (L) 39.0 - 52.0 %   MCV 101.5 (H) 80.0 - 100.0 fL   MCH 33.8 26.0 - 34.0 pg   MCHC 33.3 30.0 - 36.0 g/dL   RDW 17.9 (H) 11.5 - 15.5 %   Platelets 39 (L) 150 - 400 K/uL   nRBC 0.5 (H) 0.0 - 0.2 %  Fibrinogen     Status: Abnormal   Collection Time: 11/18/2021  7:59 PM  Result Value Ref Range   Fibrinogen 193 (L) 210 - 475 mg/dL  Protime-INR     Status: Abnormal   Collection Time: 11/08/2021  7:59 PM  Result Value Ref Range   Prothrombin Time 22.3 (H) 11.4 - 15.2 seconds   INR 2.0 (H) 0.8 - 1.2  Ammonia     Status: None   Collection Time: 12/01/2021  8:28 PM  Result Value Ref Range   Ammonia 33 9 - 35 umol/L  Prepare RBC (crossmatch)     Status: None   Collection Time: 11/13/2021  8:31 PM  Result Value Ref Range   Order Confirmation      ORDER PROCESSED BY BLOOD BANK Performed at [facility], [address], [place], [place] [address]   Prepare platelet pheresis     Status: None (Preliminary result)   Collection Time: 11/21/2021  8:32 PM  Result Value Ref Range   Unit Number [id number]    Blood Component Type PLTPHER LR2    Unit division 00    Status of Unit ISSUED    Transfusion Status OK TO TRANSFUSE   Type and screen     Status: None (Preliminary result)   Collection Time: 11/27/2021  9:26 PM  Result Value Ref Range   ABO/RH(D) B POS    Antibody Screen NEG    Sample Expiration 11/27/2021,2359    Unit Number [id number]    Blood Component Type RED CELLS,LR    Unit division 00     Status of Unit ISSUED    Transfusion Status OK TO TRANSFUSE    Crossmatch Result      Compatible Performed at [facility], [address] [place], [place] [address]   Glucose, capillary     Status: Abnormal   Collection Time: 11/25/21 12:09 AM  Result Value Ref Range   Glucose-Capillary 147 (H) 70 - 99 mg/dL  Glucose, capillary     Status: Abnormal  Collection Time: 11/25/21  3:50 AM  Result Value Ref Range   Glucose-Capillary 130 (H) 70 - 99 mg/dL  Basic metabolic panel     Status: Abnormal   Collection Time: 11/25/21  4:57 AM  Result Value Ref Range   Sodium 146 (H) 135 - 145 mmol/L   Potassium 3.9 3.5 - 5.1 mmol/L   Chloride 109 98 - 111 mmol/L   CO2 29 22 - 32 mmol/L   Glucose, Bld 150 (H) 70 - 99 mg/dL   BUN 43 (H) 6 - 20 mg/dL   Creatinine, Ser 1.71 (H) 0.61 - 1.24 mg/dL   Calcium 12.1 (H) 8.9 - 10.3 mg/dL   GFR, Estimated 47 (L) >60 mL/min   Anion gap 8 5 - 15  Magnesium     Status: Abnormal   Collection Time: 11/25/21  4:57 AM  Result Value Ref Range   Magnesium 2.6 (H) 1.7 - 2.4 mg/dL  Phosphorus     Status: None   Collection Time: 11/25/21  4:57 AM  Result Value Ref Range   Phosphorus 2.9 2.5 - 4.6 mg/dL  CBC     Status: Abnormal   Collection Time: 11/25/21  4:57 AM  Result Value Ref Range   WBC 7.6 4.0 - 10.5 K/uL   RBC 2.36 (L) 4.22 - 5.81 MIL/uL   Hemoglobin 8.0 (L) 13.0 - 17.0 g/dL   HCT 23.7 (L) 39.0 - 52.0 %   MCV 100.4 (H) 80.0 - 100.0 fL   MCH 33.9 26.0 - 34.0 pg   MCHC 33.8 30.0 - 36.0 g/dL   RDW 17.8 (H) 11.5 - 15.5 %   Platelets 72 (L) 150 - 400 K/uL   nRBC 0.5 (H) 0.0 - 0.2 %  Ammonia     Status: None   Collection Time: 11/25/21  4:57 AM  Result Value Ref Range   Ammonia 32 9 - 35 umol/L  Hepatic function panel     Status: Abnormal   Collection Time: 11/25/21  4:57 AM  Result Value Ref Range   Total Protein 6.0 (L) 6.5 - 8.1 g/dL   Albumin 3.2 (L) 3.5 - 5.0 g/dL   AST 74 (H) 15 - 41 U/L   ALT 29 0 - 44 U/L   Alkaline  Phosphatase 73 38 - 126 U/L   Total Bilirubin 4.9 (H) 0.3 - 1.2 mg/dL   Bilirubin, Direct 2.2 (H) 0.0 - 0.2 mg/dL   Indirect Bilirubin 2.7 (H) 0.3 - 0.9 mg/dL  Fibrinogen     Status: None   Collection Time: 11/25/21  4:57 AM  Result Value Ref Range   Fibrinogen 392 210 - 475 mg/dL  Protime-INR     Status: Abnormal   Collection Time: 11/25/21  4:57 AM  Result Value Ref Range   Prothrombin Time 21.3 (H) 11.4 - 15.2 seconds   INR 1.9 (H) 0.8 - 1.2    SUBJECTIVE:  Improved overnight from yesterday afternoon with minimal bleeding once blood products given and platelets/INR/fibrinogen improved.  Did need 1 unit of PRBC last night.  OBJECTIVE:  GEN-  sedated and supine on ventilator support NECK-  trach secure and patent, kerlex packing that I placed yesterday still in wound superiorly and inferiorly and with minimal dried blood present.  No active bleeding noted currently but sequela of previous bleeding/clot in tracheostoma wound still present  IMPRESSION:  s/p tracheostomy for impending airway obstruction for supraglottic mass with cirrhosis and coagulopathy  PLAN:  Stable exam without bleeding with better INR/Platelets/Fibrinogen #s  this morning.  Appreciate Hematology guidance on replacement with bleeding.  Will hold on dressing removal at this time and plan to change tomorrow if continues to be stable.  Will continue to closely follow.  Jordan Hayden 11/25/2021, 8:21 AM

## 2021-11-25 NOTE — Progress Notes (Signed)
PHARMACY CONSULT NOTE  Pharmacy Consult for Electrolyte Monitoring and Replacement   Recent Labs: Potassium (mmol/L)  Date Value  11/25/2021 3.9   Magnesium (mg/dL)  Date Value  11/25/2021 2.6 (H)   Calcium (mg/dL)  Date Value  11/25/2021 12.1 (H)   Albumin (g/dL)  Date Value  11/25/2021 3.2 (L)   Phosphorus (mg/dL)  Date Value  11/25/2021 2.9   Sodium (mmol/L)  Date Value  11/25/2021 146 (H)   Assessment: 56 year old male with history of advanced alcohol associated liver cirrhosis, alcohol abuse currently in remission, thrombocytopenia, iron deficiency anemia, morbid obesity, who presents to the ED for chief concern of nausea and vomiting blood. Pharmacy is asked to follow and replace electrolytes while in CCU  Scr down-trending  Goal of Therapy:  Electrolytes within normal limits  Plan:  --Na 146, defer management to primary team --Ca 12.1, pamidronate 60 mg x 1 given 11/17 --Follow-up electrolytes with AM labs tomorrow  Benita Gutter 11/25/2021 8:49 AM

## 2021-11-25 NOTE — Progress Notes (Signed)
Pulmonary and Critical care    NAME:  Jordan Hayden, MRN:  [id number], DOB:  [birth year], LOS: 2 ADMISSION DATE:  11/18/2021,     History of Present Illness:  The patient is a 56 year old male non-smoker with history of morbid obesity alcoholic cirrhosis history of esophageal varices and history of hepatic encephalopathy.  It appears patient was evaluated for thrombocytopenia and August 2023 at that time a 2 cm mobile lymph node was noted on the right side of his neck.  It was felt that the thrombocytopenia is secondary to liver disease and splenomegaly.  The patient presented to the ER on November 16 with vomiting and unable to eat or drink also had an episode of hematemesis.  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  Concern for malignant adenopathy in the neck.  Compression of right jugular vein.  The patient was evaluated by ENT service and today November 17 patient is s/p urgent tracheostomy for impending airway closure. patient has been seen by gastroenterology service as well for episode of hematemesis.  Patient was started on PPI as well as octreotide.  Postsurgery patient has had bleeding from tracheostomy site. He is getting empirically FFP cryoprecipitate vitamin K and platelets.  Patient was also hypotensive during this bleeding episode and 1 L LR bolus was given. Dr Pryor Ochoa present at bedside packing was done. Today November 18.  Bleeding has improved.  Patient had significant bleeding from tracheal site and this was evaluated by ENT, patient required multiple blood products in the last 24 hours including platelets cryoprecipitate FFP and packed red blood cell transfusion.  Also he is requiring low-dose vasopressor support.  He is on pantoprazole as well as octreotide infusion.  The patient was also hypercalcemic and was given pamidronate.        11/25/2021    7:45 AM  11/25/2021    7:30 AM 11/25/2021    7:15 AM  Vitals with BMI  Systolic  093   Diastolic  51   Pulse 57 56 56    Vent Mode: PRVC FiO2 (%):  [28 %-40 %] 28 % Set Rate:  [16 bmp] 16 bmp Vt Set:  [500 mL] 500 mL PEEP:  [5 cmH20] 5 cmH20 Plateau Pressure:  [14 cmH20] 14 cmH20   Examination: General: Sedated on the ventilator HENT: Improved bleeding from the tracheal site ,icterus present Lungs: Air entry heard bilaterally no wheezing Cardiovascular: S1-S2 no murmurs  abdomen: Soft nontender Extremities: +Edema present Neuro: Not move extremities spontaneously or to commands    Assessment & Plan:  1.  Cervical mass with adenopathy?  Malignancy?  Abscess.  This post biopsy 2.  Airway narrowing and shortness of breath with cervical lymphadenopathy s/p emergent tracheostomy. 3.  Alcoholic cirrhosis 4.  Esophageal varices?  GI bleed 5.  Thrombocytopenia 6.  Frequent bleeding from tracheostomy site due to thrombocytopenia and coagulopathy 7.  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  History of alcohol abuse 9.  Acute kidney injury and concern for hepatic encephalopathy 10.  poor prognosis. 11.  Morbid obesity 12.  Jaundice 13.  Hemorrhagic shock. 14. Hypercalcemia likely secondary to malignancy  Plan 1.  Status post multiple blood product transfusion. 2.  Please check daily PT and PTT.  Daily vitamin K infusion for the next 5 days. 3.  PPI and octreotide per GI service. 4.  Arterial line and central line was placed  yesterday. 5.  Continue propofol and fentanyl for sedation. 6.  Ventilator settings reviewed.   7.  Lactulose given rectally yesterday 8.  Ammonia within normal limits. 9.  We will continue without OG tube today. Best Practice (right click and "Reselect all SmartList Selections" daily)   Diet/type: NPO DVT prophylaxis: SCD GI prophylaxis: PPI Lines: Central line Foley:  Yes, and it is still needed Code Status:  full code   Labs   CBC: Recent Labs   Lab 11/13/2021 1422 12/02/2021 2010 12/05/2021 0311 11/14/2021 0823 11/12/2021 1014 12/01/2021 1959 11/25/21 0457  WBC 6.5   < > 12.8* 10.9* 9.6 5.8 7.6  NEUTROABS 4.4  --   --   --   --   --   --   HGB 12.6*   < > 12.4* 11.2* 9.1* 6.9* 8.0*  HCT 38.8*   < > 37.2* 34.0* 27.2* 20.7* 23.7*  MCV 102.6*   < > 102.2* 102.4* 101.5* 101.5* 100.4*  PLT 55*   < > 78* 57* 45* 39* 72*   < > = values in this interval not displayed.    Basic Metabolic Panel: Recent Labs  Lab 11/11/2021 1422 11/21/2021 0311 11/25/21 0457  NA 144 144 146*  K 5.5* 5.1 3.9  CL 106 103 109  CO2 '29 29 29  '$ GLUCOSE 111* 144* 150*  BUN 22* 32* 43*  CREATININE 1.40* 2.29* 1.71*  CALCIUM 12.2* 12.9* 12.1*  MG 2.6*  --  2.6*  PHOS  --   --  2.9   GFR: Estimated Creatinine Clearance: 62.8 mL/min (A) (by C-G formula based on SCr of 1.71 mg/dL (H)). Recent Labs  Lab 12/01/2021 0823 11/20/2021 1014 11/09/2021 1959 11/25/21 0457  WBC 10.9* 9.6 5.8 7.6    Liver Function Tests: Recent Labs  Lab 11/12/2021 1422 11/30/2021 0311 11/25/21 0457  AST 68* 80* 74*  ALT 30 34 29  ALKPHOS 89 104 73  BILITOT 4.4* 4.9* 4.9*  PROT 6.1* 6.8 6.0*  ALBUMIN 2.1* 2.3* 3.2*   No results for input(s): "LIPASE", "AMYLASE" in the last 168 hours. Recent Labs  Lab 11/15/2021 1422 11/22/2021 2028 11/25/21 0457  AMMONIA 48* 33 32    ABG    Component Value Date/Time   PHART 7.45 11/17/2021 1549   PCO2ART 43 11/30/2021 1549   PO2ART 101 12/01/2021 1549   HCO3 29.9 (H) 11/24/2021 1549   O2SAT 99.2 11/10/2021 1549     Coagulation Profile: Recent Labs  Lab 12/07/2021 1422 11/18/2021 0823 11/08/2021 1014 11/17/2021 1959 11/25/21 0457  INR 2.3* 2.3* 3.0* 2.0* 1.9*    Cardiac Enzymes: No results for input(s): "CKTOTAL", "CKMB", "CKMBINDEX", "TROPONINI" in the last 168 hours.  HbA1C: No results found for: "HGBA1C"  CBG: Recent Labs  Lab 11/21/2021 2000 11/11/2021 1954 11/25/21 0009 11/25/21 0350 11/25/21 0852  GLUCAP 88 201* 147* 130*  126*    RADIOLOGY   Review of Systems:   Positives in BOLD: Gen: Denies fever, chills, weight change, fatigue, night sweats HEENT: Denies blurred vision, double vision, hearing loss, tinnitus, sinus congestion, rhinorrhea, sore throat, neck stiffness, dysphagia PULM: Denies shortness of breath, cough, sputum production, hemoptysis, wheezing CV: Denies chest pain, edema, orthopnea, paroxysmal nocturnal dyspnea, palpitations GI: Denies abdominal pain, nausea, vomiting, diarrhea, hematochezia, melena, constipation, change in bowel habits GU: Denies dysuria, hematuria, polyuria, oliguria, urethral discharge Endocrine: Denies hot or cold intolerance, polyuria, polyphagia or appetite change Derm: Denies rash, dry skin, scaling or peeling skin change Heme: Denies easy bruising, bleeding, bleeding gums  Neuro: Denies headache, numbness, weakness, slurred speech, loss of memory or consciousness   Past Medical History:  He,  has a past medical history of Alcohol abuse, Alcoholic cirrhosis ([place]), Alcoholism ([place]), Arthritis, and Cirrhosis ([place]).   Surgical History:   Past Surgical History:  Procedure Laterality Date   ESOPHAGOGASTRODUODENOSCOPY (EGD) WITH PROPOFOL N/A 07/01/2017   Procedure: ESOPHAGOGASTRODUODENOSCOPY (EGD) WITH PROPOFOL;  Surgeon: Lucilla Lame, MD;  Location: [facility] ENDOSCOPY;  Service: Endoscopy;  Laterality: N/A;     Social History:   reports that he has never smoked. He has never used smokeless tobacco. He reports that he does not drink alcohol and does not use drugs.   Family History:  His family history includes Cervical cancer in his sister; Cirrhosis in his brother; Diabetes in his father; Hypertension in his mother.   Allergies Allergies  Allergen Reactions   Hydrocodone-Acetaminophen Swelling    Mouth Swelling    Penicillins Swelling     Home Medications  Prior to Admission medications   Medication Sig Start Date End Date Taking? Authorizing Provider   chlorhexidine (PERIDEX) 0.12 % solution Use as directed 5 mLs in the mouth or throat 2 (two) times daily. 11/16/21  Yes [provider]  albuterol (VENTOLIN HFA) 108 (90 Base) MCG/ACT inhaler Inhale into the lungs. Patient not taking: Reported on 11/29/2021 02/02/21 02/02/22  [provider]  aspirin EC 81 MG tablet Take 81 mg by mouth daily. Patient not taking: Reported on 07/25/2021 03/05/16   [provider]  doxycycline (VIBRAMYCIN) 100 MG capsule Take 100 mg by mouth 2 (two) times daily. Patient not taking: Reported on 11/14/2021 07/19/21   [provider]  ferrous sulfate 325 (65 FE) MG tablet Take 325 mg by mouth every morning. 06/05/20   [provider]  furosemide (LASIX) 20 MG tablet Take 1 tablet (20 mg total) by mouth daily. 07/02/17 08/31/20  Salary, Holly Bodily D, MD  hydrOXYzine (ATARAX/VISTARIL) 25 MG tablet Take 25 mg by mouth 3 (three) times daily as needed. 04/01/17   [provider]  lactulose (CHRONULAC) 10 GM/15ML solution Take 30 mLs (20 g total) by mouth 3 (three) times daily. 08/31/20   Ezekiel Slocumb, DO  meloxicam (MOBIC) 15 MG tablet Take 1 tablet (15 mg total) by mouth daily. 07/22/18   Cuthriell, Charline Bills, PA-C  pantoprazole (PROTONIX) 40 MG tablet Take 1 tablet (40 mg total) by mouth daily. 07/02/17 08/31/20  Salary, Avel Peace, MD  potassium chloride SA (K-DUR,KLOR-CON) 20 MEQ tablet Take 2 tablets (40 mEq total) by mouth 2 (two) times daily. 07/02/17   Salary, Avel Peace, MD  spironolactone (ALDACTONE) 25 MG tablet Take 0.5 tablets (12.5 mg total) by mouth 2 (two) times daily. 07/02/17   Salary, Avel Peace, MD  traMADol (ULTRAM) 50 MG tablet Take 50 mg by mouth every 6 (six) hours as needed for moderate pain. Patient not taking: Reported on 12/01/2021 11/16/21   [provider]  XIFAXAN 550 MG TABS tablet Take 550 mg by mouth 2 (two) times daily. 08/08/20   [provider]     Critical care time:  I have  personally spent ______55____ minutes of critical care time, exclusive of time spent on any  procedures, in evaluation and management of this critically ill patient's condition.    Rodrigus Kilker MD ([place]) FOR [facility]

## 2021-11-25 NOTE — Progress Notes (Signed)
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 Post-operation yesterday he developed bleeding from tracheostomy site requiring empiric FFP, cryoprecipitate, Vitamin K, and platelets. He was hypotensive during bleeding episode. Bleeding improved overnight with no recurrent bleeding from trach site. He is sedated on ventilator currently. Per nursing, no hematemesis or signs of overt GI bleeding. Sister, Jordan Hayden, present bedside.    Scheduled Inpatient Medications:   sodium chloride  250 mL Intravenous Once   Chlorhexidine Gluconate Cloth  6 each Topical Daily   docusate  100 mg Per Tube BID   insulin aspart  0-15 Units Subcutaneous Q4H   lactulose  300 mL Rectal Once   mouth rinse  15 mL Mouth Rinse Q2H   [START ON 11/27/2021] pantoprazole  40 mg Intravenous Q12H   polyethylene glycol  17 g Per Tube Daily    Continuous Inpatient Infusions:    cefTRIAXone (ROCEPHIN)  IV 200 mL/hr at 11/13/2021 1800   fentaNYL infusion INTRAVENOUS 100 mcg/hr (11/25/21 1100)   metronidazole Stopped (11/25/21 0357)   norepinephrine (LEVOPHED) Adult infusion 3 mcg/min (11/25/21 1100)   octreotide (SANDOSTATIN) 500 mcg in sodium chloride 0.9 % 250 mL (2 mcg/mL) infusion 50 mcg/hr (11/25/21 1100)   pantoprazole 8 mg/hr (11/25/21 1100)   phytonadione (VITAMIN K) 5 mg in dextrose 5 % 50 mL IVPB 50 mL/hr at 11/25/21 1100   propofol (DIPRIVAN) infusion 30 mcg/kg/min (11/25/21 1100)    PRN Inpatient Medications:  acetaminophen **OR** acetaminophen, albuterol, heparin lock flush, heparin lock flush, heparin lock flush, heparin lock flush, midazolam, ondansetron **OR** ondansetron (ZOFRAN) IV, mouth rinse, sodium chloride flush, sodium chloride flush, sodium chloride flush, sodium chloride flush  Review of Systems:  Unable to obtain 2/2 patient's critical illness. Sedated on ventilator.     Physical Examination: BP (!) 111/51   Pulse (!) 52   Temp 97.9 F (36.6 C)   Resp 16   Ht '5\' 8"'$  (1.727 m)   Wt 122.1 kg   SpO2 98%   BMI 40.93 kg/m  Gen: NAD, sedated on ventilator HEENT: PEERLA, EOMI, +scleral icterus Neck: supple, no JVD or thyromegaly Chest: CTA bilaterally, no wheezes, crackles, or other adventitious sounds CV: RRR, no m/g/c/r Abd: soft, NT, ND, +BS in all four quadrants; no HSM, guarding, ridigity, or rebound tenderness Ext: 2+ bilateral lower extremity edema  Skin: no rash or lesions noted Lymph: no LAD  Data: Lab Results  Component Value Date   WBC 7.6 11/25/2021   HGB 8.0 (L) 11/25/2021   HCT 23.7 (L) 11/25/2021   MCV 100.4 (H) 11/25/2021   PLT 72 (L) 11/25/2021   Recent Labs  Lab 11/28/2021 1014 11/21/2021 1959 11/25/21 0457  HGB 9.1* 6.9* 8.0*   Lab Results  Component Value Date   NA 146 (H) 11/25/2021   K 3.9 11/25/2021   CL 109 11/25/2021   CO2 29 11/25/2021   BUN 43 (H) 11/25/2021   CREATININE 1.71 (H) 11/25/2021   Lab Results  Component Value Date   ALT 29 11/25/2021   AST 74 (H) 11/25/2021   ALKPHOS 73 11/25/2021   BILITOT 4.9 (H) 11/25/2021   Recent Labs  Lab 11/27/2021 1959 11/25/21 0457  APTT 37*  --   INR 2.0* 1.9*    Assessment/Plan:  56 y/o AA male with a PMH of HTN, OSA, morbid obesity, IDA, Vit D deficiency, GERD, and advanced alcoholic cirrhosis of the  liver c/b ascites, HE, and Grade I EV presented to the [facility] ED via EMS for chief complaint of coughing up/spitting up blood and sore throat   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 Transfuse for Hgb <7.0.  - Continue serial abdominal examinations and neuro checks - Continue PPI and Octreotide (for total of 72 hours) - Continue Lactulose per rectum - No plans for endoscopic evaluation at this time - Follow-up results of biopsies. Oncology following.  - Continue supportive care per primary team - Very poor prognosis. Recommend goals of care discussion with palliative care pending biopsy results.  - Discussed patient's prognosis with family present in room this morning (sister - Jordan Hayden).  - GI following along with you   Please call with questions or concerns.   Octavia Bruckner, PA-C [facility] [phone]

## 2021-11-26 DIAGNOSIS — R221 Localized swelling, mass and lump, neck: Secondary | ICD-10-CM | POA: Diagnosis not present

## 2021-11-26 DIAGNOSIS — K7460 Unspecified cirrhosis of liver: Secondary | ICD-10-CM | POA: Diagnosis not present

## 2021-11-26 DIAGNOSIS — R791 Abnormal coagulation profile: Secondary | ICD-10-CM | POA: Diagnosis not present

## 2021-11-26 LAB — IRON AND TIBC: Iron: 85 ug/dL (ref 45–182)

## 2021-11-26 LAB — CBC WITH DIFFERENTIAL/PLATELET
Abs Immature Granulocytes: 0.03 10*3/uL (ref 0.00–0.07)
Abs Immature Granulocytes: 0.03 10*3/uL (ref 0.00–0.07)
Basophils Absolute: 0 10*3/uL (ref 0.0–0.1)
Basophils Absolute: 0 10*3/uL (ref 0.0–0.1)
Basophils Relative: 0 %
Basophils Relative: 0 %
Eosinophils Absolute: 0 10*3/uL (ref 0.0–0.5)
Eosinophils Absolute: 0 10*3/uL (ref 0.0–0.5)
Eosinophils Relative: 0 %
Eosinophils Relative: 0 %
HCT: 21.9 % — ABNORMAL LOW (ref 39.0–52.0)
HCT: 22.7 % — ABNORMAL LOW (ref 39.0–52.0)
Hemoglobin: 7.2 g/dL — ABNORMAL LOW (ref 13.0–17.0)
Hemoglobin: 7.4 g/dL — ABNORMAL LOW (ref 13.0–17.0)
Immature Granulocytes: 1 %
Immature Granulocytes: 1 %
Lymphocytes Relative: 9 %
Lymphocytes Relative: 11 %
Lymphs Abs: 0.3 10*3/uL — ABNORMAL LOW (ref 0.7–4.0)
Lymphs Abs: 0.4 10*3/uL — ABNORMAL LOW (ref 0.7–4.0)
MCH: 33.6 pg (ref 26.0–34.0)
MCH: 33.3 pg (ref 26.0–34.0)
MCHC: 32.9 g/dL (ref 30.0–36.0)
MCHC: 32.6 g/dL (ref 30.0–36.0)
MCV: 102.3 fL — ABNORMAL HIGH (ref 80.0–100.0)
MCV: 102.3 fL — ABNORMAL HIGH (ref 80.0–100.0)
Monocytes Absolute: 0.2 10*3/uL (ref 0.1–1.0)
Monocytes Absolute: 0.2 10*3/uL (ref 0.1–1.0)
Monocytes Relative: 6 %
Monocytes Relative: 6 %
Neutro Abs: 3.2 10*3/uL (ref 1.7–7.7)
Neutro Abs: 3.1 10*3/uL (ref 1.7–7.7)
Neutrophils Relative %: 84 %
Neutrophils Relative %: 82 %
Platelets: 36 10*3/uL — ABNORMAL LOW (ref 150–400)
Platelets: 49 10*3/uL — ABNORMAL LOW (ref 150–400)
RBC: 2.14 MIL/uL — ABNORMAL LOW (ref 4.22–5.81)
RBC: 2.22 MIL/uL — ABNORMAL LOW (ref 4.22–5.81)
RDW: 17.6 % — ABNORMAL HIGH (ref 11.5–15.5)
RDW: 17.6 % — ABNORMAL HIGH (ref 11.5–15.5)
WBC: 3.8 10*3/uL — ABNORMAL LOW (ref 4.0–10.5)
WBC: 3.8 10*3/uL — ABNORMAL LOW (ref 4.0–10.5)
nRBC: 0.8 % — ABNORMAL HIGH (ref 0.0–0.2)
nRBC: 1.3 % — ABNORMAL HIGH (ref 0.0–0.2)

## 2021-11-26 LAB — PHOSPHORUS: Phosphorus: 2.4 mg/dL — ABNORMAL LOW (ref 2.5–4.6)

## 2021-11-26 LAB — GLUCOSE, CAPILLARY
Glucose-Capillary: 133 mg/dL — ABNORMAL HIGH (ref 70–99)
Glucose-Capillary: 136 mg/dL — ABNORMAL HIGH (ref 70–99)
Glucose-Capillary: 138 mg/dL — ABNORMAL HIGH (ref 70–99)
Glucose-Capillary: 141 mg/dL — ABNORMAL HIGH (ref 70–99)
Glucose-Capillary: 143 mg/dL — ABNORMAL HIGH (ref 70–99)
Glucose-Capillary: 145 mg/dL — ABNORMAL HIGH (ref 70–99)
Glucose-Capillary: 145 mg/dL — ABNORMAL HIGH (ref 70–99)

## 2021-11-26 LAB — PROTIME-INR
INR: 2.2 — ABNORMAL HIGH (ref 0.8–1.2)
Prothrombin Time: 24.4 seconds — ABNORMAL HIGH (ref 11.4–15.2)

## 2021-11-26 LAB — PREPARE PLATELET PHERESIS: Unit division: 0

## 2021-11-26 LAB — BPAM PLATELET PHERESIS
Blood Product Expiration Date: 202311202359
ISSUE DATE / TIME: 202311180254
Unit Type and Rh: 1700

## 2021-11-26 LAB — TYPE AND SCREEN
ABO/RH(D): B POS
Antibody Screen: NEGATIVE
Unit division: 0

## 2021-11-26 LAB — BASIC METABOLIC PANEL
Anion gap: 7 (ref 5–15)
BUN: 42 mg/dL — ABNORMAL HIGH (ref 6–20)
CO2: 29 mmol/L (ref 22–32)
Calcium: 11.8 mg/dL — ABNORMAL HIGH (ref 8.9–10.3)
Chloride: 110 mmol/L (ref 98–111)
Creatinine, Ser: 1.24 mg/dL (ref 0.61–1.24)
GFR, Estimated: >60 mL/min (ref >60)
Glucose, Bld: 145 mg/dL — ABNORMAL HIGH (ref 70–99)
Potassium: 3.5 mmol/L (ref 3.5–5.1)
Sodium: 146 mmol/L — ABNORMAL HIGH (ref 135–145)

## 2021-11-26 LAB — BPAM RBC
Blood Product Expiration Date: 202312022359
ISSUE DATE / TIME: 202311180111
Unit Type and Rh: 7300

## 2021-11-26 LAB — MAGNESIUM: Magnesium: 2.8 mg/dL — ABNORMAL HIGH (ref 1.7–2.4)

## 2021-11-26 LAB — FERRITIN: Ferritin: 809 ng/mL — ABNORMAL HIGH (ref 24–336)

## 2021-11-26 LAB — APTT: aPTT: 41 seconds — ABNORMAL HIGH (ref 24–36)

## 2021-11-26 MED ORDER — SODIUM CHLORIDE 0.9% IV SOLUTION: Freq: Once | INTRAVENOUS | Status: AC

## 2021-11-26 MED ORDER — FUROSEMIDE 10 MG/ML IJ SOLN
40.0000 mg | Freq: Once | INTRAMUSCULAR | Status: AC
  Administered 2021-11-26: 40 mg via INTRAVENOUS
  Filled 2021-11-26: qty 4

## 2021-11-26 MED ORDER — ZOLEDRONIC ACID 4 MG/5ML IV CONC
4.0000 mg | Freq: Once | INTRAVENOUS | Status: AC
  Administered 2021-11-26: 4 mg via INTRAVENOUS
  Filled 2021-11-26: qty 5

## 2021-11-26 MED ORDER — SODIUM CHLORIDE 0.9 % IV SOLN: 60.0000 mg | Freq: Once | INTRAVENOUS | Status: DC

## 2021-11-26 NOTE — Progress Notes (Signed)
..11/26/2021 11:03 AM  Jordan Hayden [id number]  Post-Op Day 2    Temp:  [96.8 F (36 C)-98.6 F (37 C)] 97.2 F (36.2 C) (11/19 1000) Pulse Rate:  [51-58] 58 (11/19 0800) Resp:  [15-21] 16 (11/19 1000) BP: (99-121)/(51-84) 112/56 (11/19 1000) SpO2:  [98 %-100 %] 99 % (11/19 0827) Arterial Line BP: (109-133)/(45-57) 132/57 (11/19 1000) FiO2 (%):  [28 %] 28 % (11/19 0827),     Intake/Output Summary (Last 24 hours) at 11/26/2021 1103 Last data filed at 11/26/2021 0900 Gross per 24 hour  Intake 1519.42 ml  Output 350 ml  Net 1169.42 ml    Results for orders placed or performed during the hospital encounter of 12/07/2021 (from the past 24 hour(s))  Glucose, capillary     Status: Abnormal   Collection Time: 11/25/21  3:57 PM  Result Value Ref Range   Glucose-Capillary 114 (H) 70 - 99 mg/dL  Basic metabolic panel     Status: Abnormal   Collection Time: 11/25/21  4:41 PM  Result Value Ref Range   Sodium 146 (H) 135 - 145 mmol/L   Potassium 3.8 3.5 - 5.1 mmol/L   Chloride 109 98 - 111 mmol/L   CO2 25 22 - 32 mmol/L   Glucose, Bld 171 (H) 70 - 99 mg/dL   BUN 43 (H) 6 - 20 mg/dL   Creatinine, Ser 1.45 (H) 0.61 - 1.24 mg/dL   Calcium 12.5 (H) 8.9 - 10.3 mg/dL   GFR, Estimated 57 (L) >60 mL/min   Anion gap 12 5 - 15  Glucose, capillary     Status: Abnormal   Collection Time: 11/25/21  8:55 PM  Result Value Ref Range   Glucose-Capillary 154 (H) 70 - 99 mg/dL  Glucose, capillary     Status: Abnormal   Collection Time: 11/25/21 11:23 PM  Result Value Ref Range   Glucose-Capillary 144 (H) 70 - 99 mg/dL  Glucose, capillary     Status: Abnormal   Collection Time: 11/26/21  4:14 AM  Result Value Ref Range   Glucose-Capillary 145 (H) 70 - 99 mg/dL  Basic metabolic panel     Status: Abnormal   Collection Time: 11/26/21  4:17 AM  Result Value Ref Range   Sodium 146 (H) 135 - 145 mmol/L   Potassium 3.5 3.5 - 5.1 mmol/L   Chloride 110 98 - 111 mmol/L   CO2 29 22 - 32 mmol/L    Glucose, Bld 145 (H) 70 - 99 mg/dL   BUN 42 (H) 6 - 20 mg/dL   Creatinine, Ser 1.24 0.61 - 1.24 mg/dL   Calcium 11.8 (H) 8.9 - 10.3 mg/dL   GFR, Estimated >60 >60 mL/min   Anion gap 7 5 - 15  Magnesium     Status: Abnormal   Collection Time: 11/26/21  4:17 AM  Result Value Ref Range   Magnesium 2.8 (H) 1.7 - 2.4 mg/dL  Phosphorus     Status: Abnormal   Collection Time: 11/26/21  4:17 AM  Result Value Ref Range   Phosphorus 2.4 (L) 2.5 - 4.6 mg/dL  APTT     Status: Abnormal   Collection Time: 11/26/21  4:17 AM  Result Value Ref Range   aPTT 41 (H) 24 - 36 seconds  Protime-INR     Status: Abnormal   Collection Time: 11/26/21  4:17 AM  Result Value Ref Range   Prothrombin Time 24.4 (H) 11.4 - 15.2 seconds   INR 2.2 (H) 0.8 - 1.2  CBC with  Differential/Platelet     Status: Abnormal   Collection Time: 11/26/21  4:18 AM  Result Value Ref Range   WBC 3.8 (L) 4.0 - 10.5 K/uL   RBC 2.14 (L) 4.22 - 5.81 MIL/uL   Hemoglobin 7.2 (L) 13.0 - 17.0 g/dL   HCT 21.9 (L) 39.0 - 52.0 %   MCV 102.3 (H) 80.0 - 100.0 fL   MCH 33.6 26.0 - 34.0 pg   MCHC 32.9 30.0 - 36.0 g/dL   RDW 17.6 (H) 11.5 - 15.5 %   Platelets 36 (L) 150 - 400 K/uL   nRBC 0.8 (H) 0.0 - 0.2 %   Neutrophils Relative % 84 %   Neutro Abs 3.2 1.7 - 7.7 K/uL   Lymphocytes Relative 9 %   Lymphs Abs 0.3 (L) 0.7 - 4.0 K/uL   Monocytes Relative 6 %   Monocytes Absolute 0.2 0.1 - 1.0 K/uL   Eosinophils Relative 0 %   Eosinophils Absolute 0.0 0.0 - 0.5 K/uL   Basophils Relative 0 %   Basophils Absolute 0.0 0.0 - 0.1 K/uL   Immature Granulocytes 1 %   Abs Immature Granulocytes 0.03 0.00 - 0.07 K/uL  Glucose, capillary     Status: Abnormal   Collection Time: 11/26/21  7:55 AM  Result Value Ref Range   Glucose-Capillary 138 (H) 70 - 99 mg/dL    SUBJECTIVE:  No acute events overnight.  Bleeding controlled with packing overnight.  OBJECTIVE:  GEN-  NAD, supine in bed on ventilator NEURO-  moves head and flutters eyes when  name called NECK-  trach secure and patent with minimal blood on gauze.  Gauze removed and venous ooze began inferiorly.  4x4 packing placed in wound on inferior aspect with control of oozing.  IMPRESSION:  s/p Tracheostomy for right supraglottic mass with cirrhosis and coagulopathy  PLAN:  Patient continues to be at high risk of bleeding in post-operative period.  Platelets have again falled.  Recommend aggressive replacement of platelets/cryo/ffp if continues/returns to bleeding as this has solved issue previously.  Will continue to follow.  Follow pathology on biopsy and then most likely PET scan once stable for staging/planning purposes.  [name] 11/26/2021, 11:03 AM

## 2021-11-26 NOTE — Progress Notes (Signed)
Hematology/Oncology Progress note [phone] [phone]     Patient Care Team: Jeromie Body, MD as PCP - General (Family Medicine) Tsosie Billing, MD as Consulting Physician (Infectious Diseases) Earlie Server, MD as Consulting Physician (Oncology)   Name of the patient: Jordan Hayden  [id number]  02-06-1965  Date of visit: 11/26/21   INTERVAL HISTORY-  S/p Trach and biopsy. On ventilator and sedated.  Platelet dropped 36,000, Hb dropped to 7.2    Allergies  Allergen Reactions   Hydrocodone-Acetaminophen Swelling    Mouth Swelling    Penicillins Swelling    Patient Active Problem List   Diagnosis Date Noted   Thrombocytopenia ([place]) 04/01/2017    Priority: High   Advanced cirrhosis of liver ([place]) 02/17/2016    Priority: Medium    AKI (acute kidney injury) ([place]) 11/08/2021   Prolonged INR 11/22/2021   UGIB (upper gastrointestinal bleed) 11/22/2021   Neck mass 11/26/2021   Hemoptysis 11/25/2021   Hepatic encephalopathy ([place]) 08/30/2020   Sacroiliitis ([place]) 09/30/2018   Bacteremia due to Gram-negative bacteria 09/12/2018   Cellulitis 06/16/2018   Morbid obesity with BMI of 40.0-44.9, adult ([place]) 02/12/2018   Sepsis ([place]) 06/28/2017   Iron deficiency anemia 05/19/2017   Anasarca 02/29/2016   Dupuytren's contracture of right hand 02/17/2016   Hyperbilirubinemia 02/17/2016     Past Medical History:  Diagnosis Date   Alcohol abuse    Alcoholic cirrhosis ([place])    Alcoholism ([place])    Arthritis    Cirrhosis ([place])      Past Surgical History:  Procedure Laterality Date   ESOPHAGOGASTRODUODENOSCOPY (EGD) WITH PROPOFOL N/A 07/01/2017   Procedure: ESOPHAGOGASTRODUODENOSCOPY (EGD) WITH PROPOFOL;  Surgeon: Lucilla Lame, MD;  Location: [facility] ENDOSCOPY;  Service: Endoscopy;  Laterality: N/A;    Social History   Socioeconomic History   Marital status: Single    Spouse name: Not on file   Number of children: 1   Years of  education: Not on file   Highest education level: Not on file  Occupational History   Not on file  Tobacco Use   Smoking status: Never   Smokeless tobacco: Never  Vaping Use   Vaping Use: Never used  Substance and Sexual Activity   Alcohol use: No    Comment: quit 6 years   Drug use: No   Sexual activity: Not Currently    Partners: Female  Other Topics Concern   Not on file  Social History Narrative   Not on file   Social Determinants of Health   Financial Resource Strain: Not on file  Food Insecurity: Not on file  Transportation Needs: Not on file  Physical Activity: Not on file  Stress: Not on file  Social Connections: Not on file  Intimate Partner Violence: Not on file     Family History  Problem Relation Age of Onset   Hypertension Mother    Diabetes Father    Cervical cancer Sister    Cirrhosis Brother      Current Facility-Administered Medications:    acetaminophen (TYLENOL) tablet 650 mg, 650 mg, Oral, Q6H PRN **OR** acetaminophen (TYLENOL) suppository 650 mg, 650 mg, Rectal, Q6H PRN, Cox, Amy N, DO   albuterol (PROVENTIL) (2.5 MG/3ML) 0.083% nebulizer solution 2.5 mg, 2.5 mg, Inhalation, Q6H PRN, Cox, Amy N, DO   cefTRIAXone (ROCEPHIN) 2 g in sodium chloride 0.9 % 100 mL IVPB, 2 g, Intravenous, Q24H, Fritzi Mandes, MD, Last Rate: 200 mL/hr at 11/26/21 1303, 2 g at 11/26/21 1303   Chlorhexidine  Gluconate Cloth 2 % PADS 6 each, 6 each, Topical, Daily, Cox, Amy N, DO, 6 each at 11/26/21 1028   docusate (COLACE) 50 MG/5ML liquid 100 mg, 100 mg, Per Tube, BID, Rust-Chester, Toribio Harbour L, NP   fentaNYL 2543mg in NS 2564m(1044mml) infusion-PREMIX, 0-400 mcg/hr, Intravenous, Continuous, KeeDarel Hong NP, Last Rate: 10 mL/hr at 11/26/21 1240, 100 mcg/hr at 11/26/21 1240   heparin lock flush 100 unit/mL, 500 Units, Intracatheter, Daily PRN, AllVerlon AuP   heparin lock flush 100 unit/mL, 250 Units, Intracatheter, PRN, AllVerlon AuP   heparin lock flush  100 unit/mL, 500 Units, Intracatheter, Daily PRN, AllVerlon AuP   heparin lock flush 100 unit/mL, 250 Units, Intracatheter, PRN, AllVerlon AuP   insulin aspart (novoLOG) injection 0-15 Units, 0-15 Units, Subcutaneous, Q4H, Rust-Chester, BriHuel CoteP, 2 Units at 11/26/21 1115   [DISCONTINUED] cefTRIAXone (ROCEPHIN) 2 g in sodium chloride 0.9 % 100 mL IVPB, 2 g, Intravenous, Q24H, Stopped at 11/28/2021 1545 **AND** metroNIDAZOLE (FLAGYL) IVPB 500 mg, 500 mg, Intravenous, Q12H, Cox, Amy N, DO, Last Rate: 100 mL/hr at 11/26/21 0203, 500 mg at 11/26/21 0203   midazolam (VERSED) 100 mg/100 mL (1 mg/mL) premix infusion, 0.5-10 mg/hr, Intravenous, Continuous, Chawla, Harsh, MD, Last Rate: 2 mL/hr at 11/26/21 1034, 2 mg/hr at 11/26/21 1034   midazolam (VERSED) injection 2 mg, 2 mg, Intravenous, Q2H PRN, KeeDarel Hong NP, 2 mg at 11/26/21 1109   norepinephrine (LEVOPHED) 16 mg in 250m7m.064 mg/mL) premix infusion, 0-40 mcg/min, Intravenous, Titrated, Rust-Chester, BritHuel Cote, Stopped at 11/25/21 1818   [COMPLETED] octreotide (SANDOSTATIN) 2 mcg/mL load via infusion 50 mcg, 50 mcg, Intravenous, Once, 50 mcg at 11/29/2021 1850 **AND** octreotide (SANDOSTATIN) 500 mcg in sodium chloride 0.9 % 250 mL (2 mcg/mL) infusion, 50 mcg/hr, Intravenous, Continuous, Chawla, Harsh, MD, Last Rate: 25 mL/hr at 11/26/21 1028, 50 mcg/hr at 11/26/21 1028   ondansetron (ZOFRAN) tablet 4 mg, 4 mg, Oral, Q6H PRN **OR** ondansetron (ZOFRAN) injection 4 mg, 4 mg, Intravenous, Q6H PRN, Cox, Amy N, DO   Oral care mouth rinse, 15 mL, Mouth Rinse, Q2H, Rust-Chester, Britton L, NP, 15 mL at 11/26/21 1250   Oral care mouth rinse, 15 mL, Mouth Rinse, PRN, Rust-Chester, BritHuel Cote   [START ON 11/27/2021] pantoprazole (PROTONIX) injection 40 mg, 40 mg, Intravenous, Q12H, Cox, Amy N, DO   pantoprozole (PROTONIX) 80 mg /NS 100 mL infusion, 8 mg/hr, Intravenous, Continuous, Cox, Amy N, DO, Last Rate: 10 mL/hr at 11/26/21 1051, 8  mg/hr at 11/26/21 1051   phytonadione (VITAMIN K) 5 mg in dextrose 5 % 50 mL IVPB, 5 mg, Intravenous, Daily, Chawla, Harsh, MD, Last Rate: 50 mL/hr at 11/26/21 1027, 5 mg at 11/26/21 1027   polyethylene glycol (MIRALAX / GLYCOLAX) packet 17 g, 17 g, Per Tube, Daily, Rust-Chester, BritToribio HarbourNP   sodium chloride flush (NS) 0.9 % injection 10 mL, 10 mL, Intracatheter, PRN, AlleVerlon Au   sodium chloride flush (NS) 0.9 % injection 10 mL, 10 mL, Intracatheter, PRN, AlleVerlon Au   sodium chloride flush (NS) 0.9 % injection 3 mL, 3 mL, Intracatheter, PRN, AlleVerlon Au   sodium chloride flush (NS) 0.9 % injection 3 mL, 3 mL, Intracatheter, PRN, AlleVerlon Au   zoledronic acid (ZOMETA) 4 mg in sodium chloride 0.9 % 100 mL IVPB, 4 mg, Intravenous, Once, ChawJeani Hawking   Physical exam:  Vitals:   11/26/21  1100 11/26/21 1200 11/26/21 1245 11/26/21 1301  BP: 115/65 (!) 106/58 (!) 106/58   Pulse:   (!) 56 (!) 55  Resp: (!) '21 16 19 14  '$ Temp: (!) 97.2 F (36.2 C) (!) 97 F (36.1 C) (!) 97 F (36.1 C) (!) 97 F (36.1 C)  TempSrc:   Rectal Rectal  SpO2:   97% 98%  Weight:      Height:       Physical Exam Neck:     Comments: S/p tach Cardiovascular:     Rate and Rhythm: Normal rate.     Pulses: Normal pulses.  Pulmonary:     Comments: On Ventilator  Abdominal:     General: There is no distension.  Lymphadenopathy:     Cervical: Cervical adenopathy present.  Skin:    General: Skin is warm.       Labs    Latest Ref Rng & Units 11/26/2021    4:18 AM 11/25/2021    4:57 AM 11/14/2021    7:59 PM  CBC  WBC 4.0 - 10.5 K/uL 3.8  7.6  5.8   Hemoglobin 13.0 - 17.0 g/dL 7.2  8.0  6.9   Hematocrit 39.0 - 52.0 % 21.9  23.7  20.7   Platelets 150 - 400 K/uL 36  72  39       Latest Ref Rng & Units 11/26/2021    4:17 AM 11/25/2021    4:41 PM 11/25/2021    4:57 AM  CMP  Glucose 70 - 99 mg/dL 145  171  150   BUN 6 - 20 mg/dL 42  43  43   Creatinine 0.61 - 1.24  mg/dL 1.24  1.45  1.71   Sodium 135 - 145 mmol/L 146  146  146   Potassium 3.5 - 5.1 mmol/L 3.5  3.8  3.9   Chloride 98 - 111 mmol/L 110  109  109   CO2 22 - 32 mmol/L '29  25  29   '$ Calcium 8.9 - 10.3 mg/dL 11.8  12.5  12.1   Total Protein 6.5 - 8.1 g/dL   6.0   Total Bilirubin 0.3 - 1.2 mg/dL   4.9   Alkaline Phos 38 - 126 U/L   73   AST 15 - 41 U/L   74   ALT 0 - 44 U/L   29      RADIOGRAPHIC STUDIES: I have personally reviewed the radiological images as listed and agreed with the findings in the report. US Abdomen Limited RUQ (LIVER/GB)  Result Date: 11/25/2021 CLINICAL DATA:  Cirrhosis. EXAM: ULTRASOUND ABDOMEN LIMITED RIGHT UPPER QUADRANT COMPARISON:  Abdominal ultrasound March 01, 2021 FINDINGS: Gallbladder: Mild sludge in the gallbladder without wall thickening, pericholecystic fluid, or discrete shadowing stone. A Murphy's sign cannot be assessed due to patient sedation. Common bile duct: Diameter: 3.9 mm Liver: Nodular contour. Apparent mass measuring 2.5 x 2.3 x 1.9 cm in the left hepatic lobe not seen on previous imaging. Portal vein is patent on color Doppler imaging with normal direction of blood flow towards the liver. Other: None. IMPRESSION: 1. Suspected left hepatic lobe mass measuring up to 2.5 cm. Recommend MRI of the abdomen with and without contrast for better evaluation of this region. 2. Cirrhotic liver. 3. Mild sludge in the gallbladder. Electronically Signed   By: Dorise Bullion III M.D.   On: 11/25/2021 11:32   CT Soft Tissue Neck W Contrast  Result Date: 11/21/2021 CLINICAL DATA:  Neck mass EXAM: CT  NECK WITH CONTRAST TECHNIQUE: Multidetector CT imaging of the neck was performed using the standard protocol following the bolus administration of intravenous contrast. RADIATION DOSE REDUCTION: This exam was performed according to the departmental dose-optimization program which includes automated exposure control, adjustment of the mA and/or kV according to patient  size and/or use of iterative reconstruction technique. CONTRAST:  72m OMNIPAQUE IOHEXOL 300 MG/ML  SOLN COMPARISON:  None Available. FINDINGS: Pharynx and larynx: Mucosal mass involving the right supraglottic airway extending into the airway and causing significant airway narrowing. Mass is irregular and extends down to the vocal cords and could involve the right vocal cord. Epicenter is above the vocal cord. Mass extends up to the right lateral oropharynx. Salivary glands: No inflammation, mass, or stone. Right submandibular gland surrounded by malignant lymph nodes. Thyroid: Negative Lymph nodes: Malignant adenopathy in neck bilaterally right greater than left. Large conglomeration of lymph nodes throughout the right neck, many of which have necrosis. Right submandibular necrotic nodes measuring 15 mm and 14 mm. Right level 2 lymph nodes 29 mm and 20 mm. Right level 3 lymph nodes 15 mm and 21 mm and 17 mm. Right level 4 lymph nodes 17 mm and 28 mm. Submental lymph node to the right of midline 17 mm. Small necrotic left level 2 lymph node 9 mm. Posterior lymph nodes on the left measuring 14 mm and 13 mm. Additional smaller lymph nodes on the left. Vascular: Right jugular vein is compressed by the large lymph node mass on the right. No thrombus. Left jugular vein narrowed but patent. Limited intracranial: Negative Visualized orbits: Negative Mastoids and visualized paranasal sinuses: Mucosal edema paranasal sinuses. No air-fluid level. Mastoid clear. Skeleton: No acute abnormality. Upper chest: 5 mm nodule in the superior segment of the right lower lobe. 3 mm nodule left upper lobe. Other: None IMPRESSION: 1. 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 Mass extends down to the vocal cords and could involve the right vocal cord. Mass extends up to the right lateral oropharynx. 2. Malignant adenopathy in the neck bilaterally right greater than  left. Largest lymph node mass is in the right neck measuring 29 mm and 20 mm. 3. 5 mm nodule superior segment right lower lobe and 3 mm nodule left upper lobe. Metastatic disease is possible. Chest CT recommended. 4. Right jugular vein is compressed by the large lymph node mass on the right. No thrombus. Left jugular vein narrowed but patent. 5. These results were called by telephone at the time of interpretation on 11/27/2021 at 3:49 pm to provider [facility] who verbally acknowledged these results. Electronically Signed   By: CFranchot GalloM.D.   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 Follow up on biopsy results.  Needs staging imaging when he is stabilized.   # Coagulopathy and thrombocytopenia due to advance liver cirrhosis.  S/p platelet transfusion, cryoprecipitate, FFP.  He is now s/p procedure.  At risk of bleeding. Hb and platelet both dropped.  I recommend to keep platelet count above 50,000. Transfuse 1 unit of platelet today. Transfuse PRBC if Hb drops below 7 FFP x 1   # Advanced liver cirrhosis, GI is on board.  UKorealiver showed liver mass, primary liver lesion vs metastatic lesion. Check AFP  # Hypercalcemia, likely due to malignancy. S/p pamidronate x 1,  Calcium trending down # AKI continue IVF hydration. , avoid nephrotoxins. Cr improves.   Overall prognosis is poor. Recommend palliative care /hospice for discussion of code status, goals of care Thank you for allowing me to participate in the care of this patient.   Earlie Server, MD, PhD Hematology Oncology 11/26/2021

## 2021-11-26 NOTE — Progress Notes (Signed)
PHARMACY CONSULT NOTE  Pharmacy Consult for Electrolyte Monitoring and Replacement   Recent Labs: Potassium (mmol/L)  Date Value  11/26/2021 3.5   Magnesium (mg/dL)  Date Value  11/26/2021 2.8 (H)   Calcium (mg/dL)  Date Value  11/26/2021 11.8 (H)   Albumin (g/dL)  Date Value  11/25/2021 3.2 (L)   Phosphorus (mg/dL)  Date Value  11/26/2021 2.4 (L)   Sodium (mmol/L)  Date Value  11/26/2021 146 (H)   Assessment: 56 year old male with history of advanced alcohol associated liver cirrhosis, alcohol abuse currently in remission, thrombocytopenia, iron deficiency anemia, morbid obesity, who presents to the ED for chief concern of nausea and vomiting blood. Pharmacy is asked to follow and replace electrolytes while in CCU  Patient is currently intubated, sedated, and on mechanical ventilation in the ICU. Patient is NPO.  Scr down-trending  Goal of Therapy:  Electrolytes within normal limits  Plan:  --Na 146, defer management to primary team --Ca 11.8 (down-trending), pamidronate 60 mg x 1 given 11/17 --Phos 2.4, mildly low; continue to monitor --Follow-up electrolytes with AM labs tomorrow  Benita Gutter 11/26/2021 7:17 AM

## 2021-11-26 NOTE — Progress Notes (Signed)
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 No family bedside. Pathology still in process. There has been no recurrent GI bleeding. Hemoglobin 7.2 this morning.   Scheduled Inpatient Medications:   sodium chloride  250 mL Intravenous Once   Chlorhexidine Gluconate Cloth  6 each Topical Daily   docusate  100 mg Per Tube BID   insulin aspart  0-15 Units Subcutaneous Q4H   lactulose  300 mL Rectal Once   mouth rinse  15 mL Mouth Rinse Q2H   [START ON 11/27/2021] pantoprazole  40 mg Intravenous Q12H   polyethylene glycol  17 g Per Tube Daily    Continuous Inpatient Infusions:    cefTRIAXone (ROCEPHIN)  IV 200 mL/hr at 11/14/2021 1800   fentaNYL infusion INTRAVENOUS 100 mcg/hr (11/25/21 1100)   metronidazole Stopped (11/25/21 0357)   norepinephrine (LEVOPHED) Adult infusion 3 mcg/min (11/25/21 1100)   octreotide (SANDOSTATIN) 500 mcg in sodium chloride 0.9 % 250 mL (2 mcg/mL) infusion 50 mcg/hr (11/25/21 1100)   pantoprazole 8 mg/hr (11/25/21 1100)   phytonadione (VITAMIN K) 5 mg in dextrose 5 % 50 mL IVPB 50 mL/hr at 11/25/21 1100   propofol (DIPRIVAN) infusion 30 mcg/kg/min (11/25/21 1100)    PRN Inpatient Medications:  acetaminophen **OR** acetaminophen, albuterol, heparin lock flush, heparin lock flush, heparin lock flush, heparin lock flush, midazolam, ondansetron **OR** ondansetron (ZOFRAN) IV, mouth rinse, sodium chloride flush, sodium chloride flush, sodium chloride flush, sodium chloride flush  Review of Systems:  Unable to obtain 2/2 patient's critical illness. Sedated on ventilator.    Physical Examination: BP (!) 111/51   Pulse (!) 52   Temp 97.9 F (36.6 C)   Resp 16   Ht '5\' 8"'$  (1.727 m)   Wt 122.1 kg   SpO2 98%   BMI 40.93 kg/m  Gen: NAD, sedated on ventilator HEENT: PEERLA, EOMI, +scleral icterus Neck: supple, no JVD or  thyromegaly, trach in place  Chest: CTA bilaterally, no wheezes, crackles, or other adventitious sounds CV: RRR, no m/g/c/r Abd: soft, NT, ND, +BS in all four quadrants; no HSM, guarding, ridigity, or rebound tenderness Ext: 2+ bilateral lower extremity edema  Skin: no rash or lesions noted Lymph: no LAD  Data: Lab Results  Component Value Date   WBC 7.6 11/25/2021   HGB 8.0 (L) 11/25/2021   HCT 23.7 (L) 11/25/2021   MCV 100.4 (H) 11/25/2021   PLT 72 (L) 11/25/2021   Recent Labs  Lab 11/28/2021 1014 11/15/2021 1959 11/25/21 0457  HGB 9.1* 6.9* 8.0*   Lab Results  Component Value Date   NA 146 (H) 11/25/2021   K 3.9 11/25/2021   CL 109 11/25/2021   CO2 29 11/25/2021   BUN 43 (H) 11/25/2021   CREATININE 1.71 (H) 11/25/2021   Lab Results  Component Value Date   ALT 29 11/25/2021   AST 74 (H) 11/25/2021   ALKPHOS 73 11/25/2021   BILITOT 4.9 (H) 11/25/2021   Recent Labs  Lab 11/27/2021 1959 11/25/21 0457  APTT 37*  --   INR 2.0* 1.9*    Assessment/Plan:  56 y/o AA male with a PMH of HTN, OSA, morbid obesity, IDA, Vit D deficiency, GERD, and advanced alcoholic cirrhosis of the liver c/b ascites, HE, and Grade I EV presented to the [facility] ED via EMS for chief complaint of coughing up/spitting up blood and sore throat   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 Transfuse for Hgb <7.0.  - Continue serial abdominal examinations and neuro checks -  Continue PPI. OK to d/c Octreotide now.  - Continue Lactulose per rectum - No plans for endoscopic evaluation at this time - Follow-up results of biopsies. Oncology following.  - Continue supportive care per primary team - Very poor prognosis. Recommend goals of care discussion with palliative care pending biopsy results.  - GI following along with you  Please call with questions or concerns.   Octavia Bruckner, PA-C [facility] [phone]

## 2021-11-26 NOTE — Progress Notes (Signed)
Pulmonary and Critical care    NAME:  Jordan Hayden, MRN:  [id number], DOB:  01-Nov-1965, LOS: 3 ADMISSION DATE:  11/15/2021,     History of Present Illness:   The patient is a 56 year old male non-smoker with history of morbid obesity alcoholic cirrhosis history of esophageal varices and history of hepatic encephalopathy.  It appears patient was evaluated for thrombocytopenia and August 2023 at that time a 2 cm mobile lymph node was noted on the right side of his neck.  It was felt that the thrombocytopenia is secondary to liver disease and splenomegaly.  The patient presented to the ER on November 16 with vomiting and unable to eat or drink also had an episode of hematemesis.  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  Concern for malignant adenopathy in the neck.  Compression of right jugular vein.  The patient was evaluated by ENT service and today November 17 patient is s/p urgent tracheostomy for impending airway closure. patient has been seen by gastroenterology service as well for episode of hematemesis.  Patient was started on PPI as well as octreotide.  Postsurgery patient has had bleeding from tracheostomy site. He is getting empirically FFP cryoprecipitate vitamin K and platelets.  Patient was also hypotensive during this bleeding episode and 1 L LR bolus was given. Dr Pryor Ochoa present at bedside packing was done. Today November 18.  Bleeding has improved.  Patient had significant bleeding from tracheal site and this was evaluated by ENT, patient required multiple blood products in the last 24 hours including platelets cryoprecipitate FFP and packed red blood cell transfusion.  Also he is requiring low-dose vasopressor support.  He is on pantoprazole as well as octreotide infusion.  The patient was also hypercalcemic and was given pamidronate. November 19.  Persistent  hypercalcemia.  Patient was evaluated by ENT packing was removed and patient had rebleeding.  Continues to have thrombocytopenia and coagulopathy secondary to liver disease.  Platelet and FFP transfusion to be done patient is on daily vitamin K.  Will not be able to put NG or OG tube today.  GI recommends to discontinue octreotide.  The patient also became agitated off sedation.        11/26/2021   10:00 AM 11/26/2021    9:00 AM 11/26/2021    8:00 AM  Vitals with BMI  Systolic [phone]  Diastolic 56 51 52  Pulse   58    Vent Mode: PRVC FiO2 (%):  [28 %] 28 % Set Rate:  [16 bmp] 16 bmp Vt Set:  [500 mL] 500 mL PEEP:  [5 cmH20] 5 cmH20 Plateau Pressure:  [15 cmH20] 15 cmH20   Examination: General: On sedation currently comfortable. HENT: Icterus present.   Lungs: Air entry was heard bilaterally no wheezing or crackles. Cardiovascular: S1-S2 heard.  No murmurs. Abdomen: Bowel sounds sluggish.  No clear organomegaly palpable. Extremities: Edema present pulses palpable Neuro: Did not move extremities spontaneously or to commands.     Assessment & Plan:  1.  Cervical mass with adenopathy?  Malignancy?  Abscess.  This post biopsy 2.  Airway narrowing and shortness of breath with cervical lymphadenopathy s/p emergent tracheostomy. 3.  Alcoholic cirrhosis 4.  Esophageal varices?  GI bleed 5.  Thrombocytopenia 6.  Frequent bleeding from tracheostomy site due to thrombocytopenia and coagulopathy 7.  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  History of  alcohol abuse 9.  Acute kidney injury and concern for hepatic encephalopathy 10.  poor prognosis. 11.  Morbid obesity 12.  Jaundice 13.  Hemorrhagic shock.  Improved 14. 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  Platelet transfusion and FFP repeated today. 2.  Please check daily PT and PTT, and fibrinogen.  Daily vitamin K infusion . 3.  PPI and octreotide per GI service. 4.  1 dose IV Lasix.   5.   Continue propofol and fentanyl for sedation. 6.  Ventilator settings reviewed.   7.  Lactulose as needed rectally  8.  Pamidronate  Best Practice (right click and "Reselect all SmartList Selections" daily)   Diet/type: NPO DVT prophylaxis: SCD GI prophylaxis: PPI Lines: Central line Foley:  Yes, and it is still needed Code Status:  full code   Labs   CBC: Recent Labs  Lab 11/20/2021 1422 11/30/2021 2010 11/22/2021 0823 11/19/2021 1014 11/13/2021 1959 11/25/21 0457 11/26/21 0418  WBC 6.5   < > 10.9* 9.6 5.8 7.6 3.8*  NEUTROABS 4.4  --   --   --   --   --  3.2  HGB 12.6*   < > 11.2* 9.1* 6.9* 8.0* 7.2*  HCT 38.8*   < > 34.0* 27.2* 20.7* 23.7* 21.9*  MCV 102.6*   < > 102.4* 101.5* 101.5* 100.4* 102.3*  PLT 55*   < > 57* 45* 39* 72* 36*   < > = values in this interval not displayed.    Basic Metabolic Panel: Recent Labs  Lab 11/10/2021 1422 11/22/2021 0311 11/25/21 0457 11/25/21 1641 11/26/21 0417  NA 144 144 146* 146* 146*  K 5.5* 5.1 3.9 3.8 3.5  CL 106 103 109 109 110  CO2 '29 29 29 25 29  '$ GLUCOSE 111* 144* 150* 171* 145*  BUN 22* 32* 43* 43* 42*  CREATININE 1.40* 2.29* 1.71* 1.45* 1.24  CALCIUM 12.2* 12.9* 12.1* 12.5* 11.8*  MG 2.6*  --  2.6*  --  2.8*  PHOS  --   --  2.9  --  2.4*   GFR: Estimated Creatinine Clearance: 86.6 mL/min (by C-G formula based on SCr of 1.24 mg/dL). Recent Labs  Lab 11/09/2021 1014 11/16/2021 1959 11/25/21 0457 11/26/21 0418  WBC 9.6 5.8 7.6 3.8*    Liver Function Tests: Recent Labs  Lab 11/22/2021 1422 11/08/2021 0311 11/25/21 0457  AST 68* 80* 74*  ALT 30 34 29  ALKPHOS 89 104 73  BILITOT 4.4* 4.9* 4.9*  PROT 6.1* 6.8 6.0*  ALBUMIN 2.1* 2.3* 3.2*   No results for input(s): "LIPASE", "AMYLASE" in the last 168 hours. Recent Labs  Lab 11/13/2021 1422 12/02/2021 2028 11/25/21 0457  AMMONIA 48* 33 32    ABG    Component Value Date/Time   PHART 7.45 11/29/2021 1549   PCO2ART 43 12/06/2021 1549   PO2ART 101 12/05/2021 1549   HCO3  29.9 (H) 11/15/2021 1549   O2SAT 99.2 11/08/2021 1549     Coagulation Profile: Recent Labs  Lab 11/29/2021 0823 11/27/2021 1014 11/14/2021 1959 11/25/21 0457 11/26/21 0417  INR 2.3* 3.0* 2.0* 1.9* 2.2*    Cardiac Enzymes: No results for input(s): "CKTOTAL", "CKMB", "CKMBINDEX", "TROPONINI" in the last 168 hours.  HbA1C: No results found for: "HGBA1C"  CBG: Recent Labs  Lab 11/25/21 2055 11/25/21 2323 11/26/21 0414 11/26/21 0755 11/26/21 1114  GLUCAP 154* 144* 145* 138* 133*      Review of Systems:   Able to do review of systems as patient is currently  on ventilator and sedated. Past Medical History:  He,  has a past medical history of Alcohol abuse, Alcoholic cirrhosis ([place]), Alcoholism ([place]), Arthritis, and Cirrhosis ([place]).   Surgical History:   Past Surgical History:  Procedure Laterality Date   ESOPHAGOGASTRODUODENOSCOPY (EGD) WITH PROPOFOL N/A 07/01/2017   Procedure: ESOPHAGOGASTRODUODENOSCOPY (EGD) WITH PROPOFOL;  Surgeon: Lucilla Lame, MD;  Location: [facility] ENDOSCOPY;  Service: Endoscopy;  Laterality: N/A;     Social History:   reports that he has never smoked. He has never used smokeless tobacco. He reports that he does not drink alcohol and does not use drugs.   Family History:  His family history includes Cervical cancer in his sister; Cirrhosis in his brother; Diabetes in his father; Hypertension in his mother.   Allergies Allergies  Allergen Reactions   Hydrocodone-Acetaminophen Swelling    Mouth Swelling    Penicillins Swelling     Home Medications  Prior to Admission medications   Medication Sig Start Date End Date Taking? Authorizing Provider  chlorhexidine (PERIDEX) 0.12 % solution Use as directed 5 mLs in the mouth or throat 2 (two) times daily. 11/16/21  Yes [provider]  albuterol (VENTOLIN HFA) 108 (90 Base) MCG/ACT inhaler Inhale into the lungs. Patient not taking: Reported on 11/13/2021 02/02/21 02/02/22  [provider]   aspirin EC 81 MG tablet Take 81 mg by mouth daily. Patient not taking: Reported on 07/25/2021 03/05/16   [provider]  doxycycline (VIBRAMYCIN) 100 MG capsule Take 100 mg by mouth 2 (two) times daily. Patient not taking: Reported on 11/30/2021 07/19/21   [provider]  ferrous sulfate 325 (65 FE) MG tablet Take 325 mg by mouth every morning. 06/05/20   [provider]  furosemide (LASIX) 20 MG tablet Take 1 tablet (20 mg total) by mouth daily. 07/02/17 08/31/20  Salary, Holly Bodily D, MD  hydrOXYzine (ATARAX/VISTARIL) 25 MG tablet Take 25 mg by mouth 3 (three) times daily as needed. 04/01/17   [provider]  lactulose (CHRONULAC) 10 GM/15ML solution Take 30 mLs (20 g total) by mouth 3 (three) times daily. 08/31/20   Ezekiel Slocumb, DO  meloxicam (MOBIC) 15 MG tablet Take 1 tablet (15 mg total) by mouth daily. 07/22/18   Cuthriell, Charline Bills, PA-C  pantoprazole (PROTONIX) 40 MG tablet Take 1 tablet (40 mg total) by mouth daily. 07/02/17 08/31/20  Salary, Avel Peace, MD  potassium chloride SA (K-DUR,KLOR-CON) 20 MEQ tablet Take 2 tablets (40 mEq total) by mouth 2 (two) times daily. 07/02/17   Salary, Avel Peace, MD  spironolactone (ALDACTONE) 25 MG tablet Take 0.5 tablets (12.5 mg total) by mouth 2 (two) times daily. 07/02/17   Salary, Avel Peace, MD  traMADol (ULTRAM) 50 MG tablet Take 50 mg by mouth every 6 (six) hours as needed for moderate pain. Patient not taking: Reported on 11/18/2021 11/16/21   [provider]  XIFAXAN 550 MG TABS tablet Take 550 mg by mouth 2 (two) times daily. 08/08/20   [provider]     Critical care time:  I have personally spent _____55_____ minutes of critical care time, exclusive of time spent on any  procedures, in evaluation and management of this critically ill patient's condition.    Anandi Abramo MD ([place]) FOR [facility]

## 2021-11-26 NOTE — Plan of Care (Signed)
Discussed with Sister in front of patient plan of care for the evening, pain management and blood products with some teach back displayed by family. Problem: Education: Goal: Knowledge of General Education information will improve Description: Including pain rating scale, medication(s)/side effects and non-pharmacologic comfort measures Outcome: Not Progressing   Problem: Health Behavior/Discharge Planning: Goal: Ability to manage health-related needs will improve Outcome: Not Progressing

## 2021-11-27 ENCOUNTER — Encounter: Payer: Self-pay | Admitting: Otolaryngology

## 2021-11-27 DIAGNOSIS — Z515 Encounter for palliative care: Secondary | ICD-10-CM

## 2021-11-27 DIAGNOSIS — K7030 Alcoholic cirrhosis of liver without ascites: Secondary | ICD-10-CM

## 2021-11-27 DIAGNOSIS — K7460 Unspecified cirrhosis of liver: Secondary | ICD-10-CM | POA: Diagnosis not present

## 2021-11-27 DIAGNOSIS — Z6841 Body Mass Index (BMI) 40.0 and over, adult: Secondary | ICD-10-CM

## 2021-11-27 DIAGNOSIS — K7682 Hepatic encephalopathy: Secondary | ICD-10-CM

## 2021-11-27 DIAGNOSIS — R221 Localized swelling, mass and lump, neck: Secondary | ICD-10-CM | POA: Diagnosis not present

## 2021-11-27 DIAGNOSIS — J9601 Acute respiratory failure with hypoxia: Secondary | ICD-10-CM

## 2021-11-27 DIAGNOSIS — Z789 Other specified health status: Secondary | ICD-10-CM

## 2021-11-27 LAB — BPAM PLATELET PHERESIS
Blood Product Expiration Date: 202311202359
Blood Product Expiration Date: 202311212359
Blood Product Expiration Date: 202311212359
ISSUE DATE / TIME: 202311191513
ISSUE DATE / TIME: 202311191557
ISSUE DATE / TIME: 202311191739
Unit Type and Rh: 5100
Unit Type and Rh: 6200
Unit Type and Rh: 6200

## 2021-11-27 LAB — CBC WITH DIFFERENTIAL/PLATELET
Abs Immature Granulocytes: 0.03 10*3/uL (ref 0.00–0.07)
Basophils Absolute: 0 10*3/uL (ref 0.0–0.1)
Basophils Relative: 0 %
Eosinophils Absolute: 0 10*3/uL (ref 0.0–0.5)
Eosinophils Relative: 0 %
HCT: 22.9 % — ABNORMAL LOW (ref 39.0–52.0)
Hemoglobin: 7.5 g/dL — ABNORMAL LOW (ref 13.0–17.0)
Immature Granulocytes: 1 %
Lymphocytes Relative: 13 %
Lymphs Abs: 0.4 10*3/uL — ABNORMAL LOW (ref 0.7–4.0)
MCH: 33.5 pg (ref 26.0–34.0)
MCHC: 32.8 g/dL (ref 30.0–36.0)
MCV: 102.2 fL — ABNORMAL HIGH (ref 80.0–100.0)
Monocytes Absolute: 0.2 10*3/uL (ref 0.1–1.0)
Monocytes Relative: 6 %
Neutro Abs: 2.5 10*3/uL (ref 1.7–7.7)
Neutrophils Relative %: 80 %
Platelets: 39 10*3/uL — ABNORMAL LOW (ref 150–400)
RBC: 2.24 MIL/uL — ABNORMAL LOW (ref 4.22–5.81)
RDW: 17.4 % — ABNORMAL HIGH (ref 11.5–15.5)
WBC: 3.2 10*3/uL — ABNORMAL LOW (ref 4.0–10.5)
nRBC: 1.3 % — ABNORMAL HIGH (ref 0.0–0.2)

## 2021-11-27 LAB — PREPARE PLATELET PHERESIS
Unit division: 0
Unit division: 0
Unit division: 0

## 2021-11-27 LAB — GLUCOSE, CAPILLARY
Glucose-Capillary: 129 mg/dL — ABNORMAL HIGH (ref 70–99)
Glucose-Capillary: 145 mg/dL — ABNORMAL HIGH (ref 70–99)
Glucose-Capillary: 167 mg/dL — ABNORMAL HIGH (ref 70–99)
Glucose-Capillary: 156 mg/dL — ABNORMAL HIGH (ref 70–99)
Glucose-Capillary: 134 mg/dL — ABNORMAL HIGH (ref 70–99)
Glucose-Capillary: 131 mg/dL — ABNORMAL HIGH (ref 70–99)

## 2021-11-27 LAB — PREPARE FRESH FROZEN PLASMA
Unit division: 0
Unit division: 0

## 2021-11-27 LAB — BASIC METABOLIC PANEL
Anion gap: 4 — ABNORMAL LOW (ref 5–15)
BUN: 37 mg/dL — ABNORMAL HIGH (ref 6–20)
CO2: 33 mmol/L — ABNORMAL HIGH (ref 22–32)
Calcium: 11.3 mg/dL — ABNORMAL HIGH (ref 8.9–10.3)
Chloride: 114 mmol/L — ABNORMAL HIGH (ref 98–111)
Creatinine, Ser: 1.04 mg/dL (ref 0.61–1.24)
GFR, Estimated: >60 mL/min (ref >60)
Glucose, Bld: 152 mg/dL — ABNORMAL HIGH (ref 70–99)
Potassium: 3.4 mmol/L — ABNORMAL LOW (ref 3.5–5.1)
Sodium: 151 mmol/L — ABNORMAL HIGH (ref 135–145)

## 2021-11-27 LAB — MAGNESIUM: Magnesium: 2.9 mg/dL — ABNORMAL HIGH (ref 1.7–2.4)

## 2021-11-27 LAB — BPAM FFP
Blood Product Expiration Date: 202311242359
Blood Product Expiration Date: 202311242359
ISSUE DATE / TIME: 202311191235
ISSUE DATE / TIME: 202311191339
Unit Type and Rh: 7300
Unit Type and Rh: 8400

## 2021-11-27 LAB — PROTIME-INR
INR: 1.9 — ABNORMAL HIGH (ref 0.8–1.2)
Prothrombin Time: 21.2 seconds — ABNORMAL HIGH (ref 11.4–15.2)

## 2021-11-27 LAB — PHOSPHORUS: Phosphorus: 2.2 mg/dL — ABNORMAL LOW (ref 2.5–4.6)

## 2021-11-27 LAB — APTT: aPTT: 36 seconds (ref 24–36)

## 2021-11-27 LAB — D-DIMER, QUANTITATIVE: D-Dimer, Quant: 15.82 ug/mL-FEU — ABNORMAL HIGH (ref 0.00–0.50)

## 2021-11-27 LAB — FIBRINOGEN
Fibrinogen: 135 mg/dL — ABNORMAL LOW (ref 210–475)
Fibrinogen: 142 mg/dL — ABNORMAL LOW (ref 210–475)

## 2021-11-27 MED ORDER — SODIUM CHLORIDE 0.9% IV SOLUTION: Freq: Once | INTRAVENOUS | Status: AC

## 2021-11-27 MED ORDER — THIAMINE HCL 100 MG/ML IJ SOLN
100.0000 mg | INTRAMUSCULAR | Status: DC
  Administered 2021-11-30 – 2021-12-02 (×3): 100 mg via INTRAVENOUS
  Filled 2021-11-27 (×3): qty 2

## 2021-11-27 MED ORDER — DEXTROSE 5 % IV SOLN: INTRAVENOUS | Status: DC

## 2021-11-27 MED ORDER — LACTULOSE ENEMA
300.0000 mL | Freq: Every day | ORAL | Status: DC
  Administered 2021-11-27 – 2021-12-01 (×5): 300 mL via RECTAL
  Filled 2021-11-27 (×5): qty 300

## 2021-11-27 MED ORDER — THIAMINE MONONITRATE 100 MG PO TABS: 200.0000 mg | ORAL_TABLET | Freq: Every day | ORAL | Status: DC

## 2021-11-27 MED ORDER — THIAMINE HCL 100 MG/ML IJ SOLN
500.0000 mg | Freq: Three times a day (TID) | INTRAVENOUS | Status: AC
  Administered 2021-11-27 – 2021-11-29 (×9): 500 mg via INTRAVENOUS
  Filled 2021-11-27 (×9): qty 5

## 2021-11-27 MED ORDER — THIAMINE MONONITRATE 100 MG PO TABS: 500.0000 mg | ORAL_TABLET | Freq: Three times a day (TID) | ORAL | Status: DC

## 2021-11-27 MED ORDER — FOLIC ACID 5 MG/ML IJ SOLN
1.0000 mg | Freq: Every day | INTRAMUSCULAR | Status: DC
  Administered 2021-11-27 – 2021-12-02 (×6): 1 mg via INTRAVENOUS
  Filled 2021-11-27 (×6): qty 0.2

## 2021-11-27 MED ORDER — FENTANYL CITRATE PF 50 MCG/ML IJ SOSY
50.0000 ug | PREFILLED_SYRINGE | INTRAMUSCULAR | Status: DC | PRN
  Administered 2021-11-28 (×2): 50 ug via INTRAVENOUS
  Administered 2021-11-29: 100 ug via INTRAVENOUS
  Administered 2021-11-29 – 2021-12-01 (×4): 50 ug via INTRAVENOUS
  Filled 2021-11-27 (×4): qty 1
  Filled 2021-11-27: qty 2
  Filled 2021-11-27 (×2): qty 1

## 2021-11-27 MED ORDER — POTASSIUM PHOSPHATES 15 MMOLE/5ML IV SOLN
15.0000 mmol | Freq: Once | INTRAVENOUS | Status: AC
  Administered 2021-11-27: 15 mmol via INTRAVENOUS
  Filled 2021-11-27: qty 5

## 2021-11-27 NOTE — Progress Notes (Signed)
Inpatient Follow-up/Progress Note   Patient ID: Jordan Hayden is a 56 y.o. male.  Overnight Events / Subjective Findings Continues to have oozing from surgical site. New liver mass noted on [place]; Oncology following, as is palliative care. Pt is not currently on sedation, but does not open eyes to voice or pain. Does grimace with discomfort.  No family at bedside currently.  Review of Systems  Unable to perform ROS: Other (trach, obtunded)     Medications  Current Facility-Administered Medications:    albuterol (PROVENTIL) (2.5 MG/3ML) 0.083% nebulizer solution 2.5 mg, 2.5 mg, Inhalation, Q6H PRN, Cox, Amy N, DO   cefTRIAXone (ROCEPHIN) 2 g in sodium chloride 0.9 % 100 mL IVPB, 2 g, Intravenous, Q24H, Fritzi Mandes, MD, Stopped at 11/26/21 1333   Chlorhexidine Gluconate Cloth 2 % PADS 6 each, 6 each, Topical, Daily, Cox, Amy N, DO, 6 each at 11/27/21 0800   dextrose 5 % solution, , Intravenous, Continuous, Teressa Lower, NP, Last Rate: 50 mL/hr at 11/27/21 0846, New Bag at 11/27/21 0846   docusate (COLACE) 50 MG/5ML liquid 100 mg, 100 mg, Per Tube, BID, Rust-Chester, Toribio Harbour L, NP   fentaNYL 2542mg in NS 2566m(1054mml) infusion-PREMIX, 0-400 mcg/hr, Intravenous, Continuous, KeeDarel Hong NP, Last Rate: 5 mL/hr at 11/27/21 1125, 50 mcg/hr at 11/16/60/8326629folic acid injection 1 mg, 1 mg, Intravenous, Daily, Beers, BraShanon BrowPH, 1 mg at 11/27/21 1137   heparin lock flush 100 unit/mL, 500 Units, Intracatheter, Daily PRN, AllVerlon AuP   heparin lock flush 100 unit/mL, 250 Units, Intracatheter, PRN, AllVerlon AuP   heparin lock flush 100 unit/mL, 500 Units, Intracatheter, Daily PRN, AllVerlon AuP   heparin lock flush 100 unit/mL, 250 Units, Intracatheter, PRN, AllVerlon AuP   insulin aspart (novoLOG) injection 0-15 Units, 0-15 Units, Subcutaneous, Q4H, Rust-Chester, BriToribio Harbour NP, 3 Units at 11/27/21 1135   lactulose (CHRONULAC) enema 200 gm, 300 mL,  Rectal, Daily, Dgayli, Khabib, MD   [DISCONTINUED] cefTRIAXone (ROCEPHIN) 2 g in sodium chloride 0.9 % 100 mL IVPB, 2 g, Intravenous, Q24H, Stopped at 11/22/2021 1545 **AND** metroNIDAZOLE (FLAGYL) IVPB 500 mg, 500 mg, Intravenous, Q12H, Cox, Amy N, DO, Stopped at 11/27/21 0510   midazolam (VERSED) 100 mg/100 mL (1 mg/mL) premix infusion, 0.5-10 mg/hr, Intravenous, Continuous, Chawla, Harsh, MD, Stopped at 11/27/21 0934   midazolam (VERSED) injection 2 mg, 2 mg, Intravenous, Q2H PRN, KeeDarel Hong NP, 2 mg at 11/26/21 1109   norepinephrine (LEVOPHED) 16 mg in 250m62m.064 mg/mL) premix infusion, 0-40 mcg/min, Intravenous, Titrated, Rust-Chester, BritHuel Cote, Stopped at 11/25/21 1818   ondansetron (ZOFRAN) tablet 4 mg, 4 mg, Oral, Q6H PRN **OR** ondansetron (ZOFRAN) injection 4 mg, 4 mg, Intravenous, Q6H PRN, Cox, Amy N, DO   Oral care mouth rinse, 15 mL, Mouth Rinse, Q2H, Rust-Chester, Britton L, NP, 15 mL at 11/27/21 1200   Oral care mouth rinse, 15 mL, Mouth Rinse, PRN, Rust-Chester, BritToribio HarbourNP   pantoprazole (PROTONIX) injection 40 mg, 40 mg, Intravenous, Q12H, Cox, Amy N, DO, 40 mg at 11/27/21 0404   phytonadione (VITAMIN K) 5 mg in dextrose 5 % 50 mL IVPB, 5 mg, Intravenous, Daily, Chawla, Harsh, MD, Last Rate: 50 mL/hr at 11/27/21 1125, Infusion Verify at 11/27/21 1125   polyethylene glycol (MIRALAX / GLYCOLAX) packet 17 g, 17 g, Per Tube, Daily, Rust-Chester, Britton L, NP   potassium PHOSPHATE 15 mmol in dextrose 5 % 250 mL infusion,  15 mmol, Intravenous, Once, Teressa Lower, NP, Last Rate: 43 mL/hr at 11/27/21 1125, Infusion Verify at 11/27/21 1125   sodium chloride flush (NS) 0.9 % injection 10 mL, 10 mL, Intracatheter, PRN, Verlon Au, NP   sodium chloride flush (NS) 0.9 % injection 10 mL, 10 mL, Intracatheter, PRN, Verlon Au, NP   sodium chloride flush (NS) 0.9 % injection 3 mL, 3 mL, Intracatheter, PRN, Verlon Au, NP   sodium chloride flush (NS) 0.9 % injection 3  mL, 3 mL, Intracatheter, PRN, Verlon Au, NP   thiamine (VITAMIN B1) 500 mg in normal saline (50 mL) IVPB, 500 mg, Intravenous, TID, Last Rate: 100 mL/hr at 11/27/21 1203, 500 mg at 11/27/21 1203 **FOLLOWED BY** [START ON 11/30/2021] thiamine (VITAMIN B1) injection 100 mg, 100 mg, Intravenous, Q24H, Beers, Brandon D, RPH  cefTRIAXone (ROCEPHIN)  IV Stopped (11/26/21 1333)   dextrose 50 mL/hr at 11/27/21 0846   fentaNYL infusion INTRAVENOUS 50 mcg/hr (11/27/21 1125)   metronidazole Stopped (11/27/21 0510)   midazolam Stopped (11/27/21 0934)   norepinephrine (LEVOPHED) Adult infusion Stopped (11/25/21 1818)   phytonadione (VITAMIN K) 5 mg in dextrose 5 % 50 mL IVPB 50 mL/hr at 11/27/21 1125   potassium PHOSPHATE IVPB (in mmol) 43 mL/hr at 11/27/21 1125   thiamine (VITAMIN B1) injection 500 mg (11/27/21 1203)    albuterol, heparin lock flush, heparin lock flush, heparin lock flush, heparin lock flush, midazolam, ondansetron **OR** ondansetron (ZOFRAN) IV, mouth rinse, sodium chloride flush, sodium chloride flush, sodium chloride flush, sodium chloride flush   Objective    Vitals:   11/27/21 0900 11/27/21 0920 11/27/21 1000 11/27/21 1100  BP: 116/63 116/63 113/62   Pulse:  (!) 58    Resp: _0 Temp: (!) 97.5 F (36.4 C) (!) 97.5 F (36.4 C) (!) 97.5 F (36.4 C) (!) 97.5 F (36.4 C)  TempSrc:  Rectal Rectal   SpO2:      Weight:      Height:         Physical Exam Vitals and nursing note reviewed.  Constitutional:      General: He is not in acute distress.    Appearance: He is ill-appearing. He is not toxic-appearing or diaphoretic.  HENT:     Head: Normocephalic and atraumatic.     Nose: Nose normal.  Neck:     Comments: Trach in place Cardiovascular:     Rate and Rhythm: Regular rhythm. Bradycardia present.     Heart sounds: Normal heart sounds. No murmur heard.    No friction rub. No gallop.  Pulmonary:     Effort: No respiratory distress.     Breath sounds:  No wheezing, rhonchi or rales.     Comments: Trach to vent Abdominal:     General: There is no distension.     Palpations: Abdomen is soft.     Tenderness: There is no abdominal tenderness. There is no guarding or rebound.     Comments: Hypoactive bowel sounds. Does change facial expression with palpation of abdomen  Musculoskeletal:     Cervical back: Neck supple.     Right lower leg: Edema present.     Left lower leg: Edema present.  Skin:    General: Skin is warm and dry.     Coloration: Skin is not jaundiced or pale.      Laboratory Data Recent Labs  Lab 11/13/2021 1422 11/21/2021 2010 11/25/21 0457 11/26/21 0418 11/26/21 2115  WBC  6.5   < > 7.6 3.8* 3.8*  HGB 12.6*   < > 8.0* 7.2* 7.4*  HCT 38.8*   < > 23.7* 21.9* 22.7*  PLT 55*   < > 72* 36* 49*  NEUTOPHILPCT 67  --   --  84 82  LYMPHOPCT 24  --   --  9 11  MONOPCT 7  --   --  6 6  EOSPCT 2  --   --  0 0   < > = values in this interval not displayed.   Recent Labs  Lab 11/13/2021 1422 11/29/2021 0311 11/25/21 0457 11/25/21 1641 11/26/21 0417 11/27/21 0445  NA 144 144 146* 146* 146* 151*  K 5.5* 5.1 3.9 3.8 3.5 3.4*  CL 106 103 109 109 110 114*  CO2 _0 33*  BUN 22* 32* 43* 43* 42* 37*  CREATININE 1.40* 2.29* 1.71* 1.45* 1.24 1.04  CALCIUM 12.2* 12.9* 12.1* 12.5* 11.8* 11.3*  PROT 6.1* 6.8 6.0*  --   --   --   BILITOT 4.4* 4.9* 4.9*  --   --   --   ALKPHOS 89 104 73  --   --   --   ALT 30 34 29  --   --   --   AST 68* 80* 74*  --   --   --   GLUCOSE 111* 144* 150* 171* 145* 152*   Recent Labs  Lab 11/25/21 0457 11/26/21 0417 11/27/21 0445  INR 1.9* 2.2* 1.9*      Imaging Studies: No results found.  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 ascites, HE, Gr I EV B. MELD-Na 25, Child's Class C (11 points)  C. Ultrasound 11/18 commented on new left lobe liver mass   Thrombocytopenia - 72K, 2/2 #2  Anemia 2/2 blood loss   5. Hepatic encephalopathy   6. AKI   7. Hemorrhagic Hayden   8. Hypercalcemia    9. Coagulopathy  10. S/p trach  11. Multiple electrolyte abnormalities  Plan:  New mass in liver concerning for [place] vs met; heme/onc following;  Pending goals of care conversation (palliative care) following- can consider MRI abdomen with and without contrast to better elucidate liver lesion- suspect this lesion accounts for his decompensation upon presentation Given likelihood of other malignancy in the glottic area, he would not be a liver transplant candidate AFP to be collected Renal fxn improved with albumin challenge  Blood loss from oozing mass/surgical site With supraglottic mass - upper endoscopy will not be able to be performed Protonix 40 mg iv q12 h Finish 7 d total of abx (from sbp ppx perspective) Hold dvt ppx Monitor H&H.  Transfusion and resuscitation as per primary team Avoid frequent lab draws to prevent lab induced anemia Supportive care and antiemetics as per primary team Maintain two sites IV access Avoid nsaids Continue lactulose enemas Monitor for GIB.  Strongly consider palliative care/hospice given liver mass and occupying supraglottic lesion (pathology pending)  No planned GI intervention at this  time. Will follow peripherally, please contact if any further concerns  I personally performed the service.  Management of other medical comorbidities as per primary team  Thank you for allowing [place] to participate in this patient's care. Please don't hesitate to call if any questions or concerns arise.    Annamaria Helling, DO [facility]  Portions of the record may have been created with voice recognition software. Occasional wrong-word or 'sound-a-like' substitutions may have occurred due to the inherent limitations of voice recognition software.  Read the chart carefully and recognize, using context, where substitutions may have occurred.

## 2021-11-27 NOTE — Plan of Care (Signed)
Discussed with patient plan of care for the evening, pain management and blood products with no evidence of learning at this time.  Patient is vented.  Problem: Education: Goal: Knowledge of General Education information will improve Description: Including pain rating scale, medication(s)/side effects and non-pharmacologic comfort measures Outcome: Progressing   Problem: Health Behavior/Discharge Planning: Goal: Ability to manage health-related needs will improve Outcome: Progressing

## 2021-11-27 NOTE — Progress Notes (Signed)
PHARMACY CONSULT NOTE  Pharmacy Consult for Electrolyte Monitoring and Replacement   Recent Labs: Potassium (mmol/L)  Date Value  11/27/2021 3.4 (L)   Magnesium (mg/dL)  Date Value  11/27/2021 2.9 (H)   Calcium (mg/dL)  Date Value  11/27/2021 11.3 (H)   Albumin (g/dL)  Date Value  11/25/2021 3.2 (L)   Phosphorus (mg/dL)  Date Value  11/27/2021 2.2 (L)   Sodium (mmol/L)  Date Value  11/27/2021 151 (H)   Assessment: 56 year old male with history of advanced alcohol associated liver cirrhosis, alcohol abuse currently in remission, thrombocytopenia, iron deficiency anemia, morbid obesity, who presents to the ED for chief concern of nausea and vomiting blood. Pharmacy is asked to follow and replace electrolytes while in CCU  Patient is currently intubated, sedated, and on mechanical ventilation in the ICU. Patient is NPO.    Goal of Therapy:  Electrolytes within normal limits  Plan: AKI resolving creatinine improved & UOP picked up. Will need to watch fluid balance & f/u with more intermittent lasix doses. Scr 1.45>1.24>1.04; I/O +8.5L; UOP 0.2>0.64m/k/h  Na 146>151 - Lasix '40mg'$  IV x1 on 11/19. (Defer management to primary team ) continues D5W '@50ml'$ /h  K 3.5>3.4:  Will receive 252m K+ from IV Kphos solution.  Ca 11.8 >11.3 (down-trending), pamidronate 60 mg x 1 on 11/17. Zometa '4mg'$  IV x1 on 11/19. No further doses at this time.  Phos 2.4>2.2:  Repleting with Kphos IV 1560m x1 (per CCU NP)  Mg 2.8>2.9:  mild elevation. No change at this time  --Follow-up electrolytes with AM labs tomorrow  BraLorna Dibble/20/2023 9:00 AM

## 2021-11-27 NOTE — Progress Notes (Signed)
Initial Nutrition Assessment  DOCUMENTATION CODES:   Morbid obesity  INTERVENTION:   RD will monitor for GOC   Recommend TPN if unable to gain enteral access over the next 1-2 days  If enteral access gained, recommend:  Vital HP '@75ml'$ /hr- Initiate at 51m/hr and increase by 144mhr q 8 hours until goal rate is reached.   Free water flushes 3037m4 hours to maintain tube patency   Regimen provides 1800kcal/day, 158g/day protein and 1685m9my of free water.   Pt at high refeed risk; recommend monitor potassium, magnesium and phosphorus labs daily until stable  Daily weights   NUTRITION DIAGNOSIS:   Inadequate oral intake related to inability to eat (right supraglottic mass) as evidenced by NPO status.  GOAL:   Provide needs based on ASPEN/SCCM guidelines  MONITOR:   Labs, TF tolerance, Weight trends, Skin, I & O's, Vent status  REASON FOR ASSESSMENT:   Ventilator    ASSESSMENT:   56 y65 male with h/o etoh abuse and cirrhosis who is admitted with new right supraglottic mass s/p tracheostomy 11/17.  Pt ventilated via trach. Pt has no enteral access. Per chart, pt down 58lbs(17%) over the past 4 months; this is significant weight loss. Palliative care consult is pending. Pt will need IR G-tube if family wishes to pursue full aggressive care. Pt initiated on IV dextrose today; pt is at high refeed risk. RD will monitor for GOC. May need to consider TPN initiation if patient is unable to gain enteral access over the next 1-2 days. Pt with h/o etoh abuse; pt is receiving high dose IV thiamine.   Medications reviewed and include: colace, folic acid, insulin, lactulose, protonix, miralax, thiamine, ceftriaxone, 5% dextrose '@50ml'$ /hr, vitamin K, Kphos, thiamine   Labs reviewed: Na 151(H), K 3.4(L), BUN 37(H), P 2.2(L), Mg 2.9 wnl Wbc- 3.2(L), Hgb 7.5(L), Hct 22.9(L) Cbgs- 167, 145, 129 x 24 hrs  Patient is currently intubated on ventilator support MV: 8.0 L/min Temp  (24hrs), Avg:97.4 F (36.3 C), Min:97 F (36.1 C), Max:98.1 F (36.7 C)  Propofol: none   MAP- >65mm54m UOP- 2300ml 32mTRITION - FOCUSED PHYSICAL EXAM:  Flowsheet Row Most Recent Value  Orbital Region No depletion  Upper Arm Region No depletion  Thoracic and Lumbar Region No depletion  Buccal Region No depletion  Temple Region No depletion  Clavicle Bone Region No depletion  Clavicle and Acromion Bone Region No depletion  Scapular Bone Region No depletion  Dorsal Hand No depletion  Patellar Region No depletion  Anterior Thigh Region No depletion  Posterior Calf Region No depletion  Edema (RD Assessment) Moderate  Hair Reviewed  Eyes Reviewed  Mouth Reviewed  Skin Reviewed  Nails Reviewed   Diet Order:   Diet Order             Diet NPO time specified  Diet effective now                  EDUCATION NEEDS:   No education needs have been identified at this time  Skin:  Skin Assessment: Reviewed RN Assessment (incision neck)  Last BM:  11/15  Height:   Ht Readings from Last 1 Encounters:  11/21/2021 '5\' 8"'$  (1.727 m)    Weight:   Wt Readings from Last 1 Encounters:  11/27/21 128.1 kg    Ideal Body Weight:  70 kg  BMI:  Body mass index is 42.94 kg/m.  Estimated Nutritional Needs:   Kcal:  1408-1792kcal/day  Protein:  150-170g/day  Fluid:  2.2-2.5L/day  Koleen Distance MS, RD, LDN Please refer to [facility] for RD and/or RD on-call/weekend/after hours pager

## 2021-11-27 NOTE — Progress Notes (Signed)
..11/27/2021 8:14 AM  Jordan Hayden [id number]  Post-Op Day 3    Temp:  [97 F (36.1 C)-98.1 F (36.7 C)] 97.7 F (36.5 C) (11/20 0700) Pulse Rate:  [55-58] 58 (11/19 1806) Resp:  [14-21] 16 (11/20 0700) BP: (101-151)/(51-71) 117/61 (11/20 0700) SpO2:  [95 %-100 %] 96 % (11/20 0315) Arterial Line BP: (116-163)/(47-64) 119/50 (11/20 0700) FiO2 (%):  [28 %] 28 % (11/20 0259) Weight:  [128.1 kg] 128.1 kg (11/20 0400),     Intake/Output Summary (Last 24 hours) at 11/27/2021 0258 Last data filed at 11/27/2021 0800 Gross per 24 hour  Intake 2067.21 ml  Output 2300 ml  Net -232.79 ml    Results for orders placed or performed during the hospital encounter of 11/29/2021 (from the past 24 hour(s))  Glucose, capillary     Status: Abnormal   Collection Time: 11/26/21 11:14 AM  Result Value Ref Range   Glucose-Capillary 133 (H) 70 - 99 mg/dL  Prepare platelet pheresis     Status: None (Preliminary result)   Collection Time: 11/26/21 11:27 AM  Result Value Ref Range   Unit Number [id number]    Blood Component Type PLTP1 PSORALEN TREATED    Unit division 00    Status of Unit ISSUED    Transfusion Status      OK TO TRANSFUSE Performed at [facility], [address] [place], [place] [address]    Unit Number [id number]    Blood Component Type PLTP2 PSORALEN TREATED    Unit division 00    Status of Unit REL FROM [facility]    Transfusion Status OK TO TRANSFUSE    Unit Number [id number]    Blood Component Type PLTP1 PSORALEN TREATED    Unit division 00    Status of Unit ISSUED    Transfusion Status OK TO TRANSFUSE   Prepare fresh frozen plasma     Status: None (Preliminary result)   Collection Time: 11/26/21 11:28 AM  Result Value Ref Range   Unit Number [id number]    Blood Component Type THAWED PLASMA    Unit division 00    Status of Unit ISSUED    Transfusion Status OK TO TRANSFUSE    Unit Number [id number]    Blood Component Type THAWED PLASMA     Unit division 00    Status of Unit ISSUED    Transfusion Status OK TO TRANSFUSE   Glucose, capillary     Status: Abnormal   Collection Time: 11/26/21  3:22 PM  Result Value Ref Range   Glucose-Capillary 143 (H) 70 - 99 mg/dL   Comment 1 Notify RN   Glucose, capillary     Status: Abnormal   Collection Time: 11/26/21  8:11 PM  Result Value Ref Range   Glucose-Capillary 136 (H) 70 - 99 mg/dL  CBC with Differential/Platelet     Status: Abnormal   Collection Time: 11/26/21  9:15 PM  Result Value Ref Range   WBC 3.8 (L) 4.0 - 10.5 K/uL   RBC 2.22 (L) 4.22 - 5.81 MIL/uL   Hemoglobin 7.4 (L) 13.0 - 17.0 g/dL   HCT 22.7 (L) 39.0 - 52.0 %   MCV 102.3 (H) 80.0 - 100.0 fL   MCH 33.3 26.0 - 34.0 pg   MCHC 32.6 30.0 - 36.0 g/dL   RDW 17.6 (H) 11.5 - 15.5 %   Platelets 49 (L) 150 - 400 K/uL   nRBC 1.3 (H) 0.0 - 0.2 %   Neutrophils Relative % 82 %  Neutro Abs 3.1 1.7 - 7.7 K/uL   Lymphocytes Relative 11 %   Lymphs Abs 0.4 (L) 0.7 - 4.0 K/uL   Monocytes Relative 6 %   Monocytes Absolute 0.2 0.1 - 1.0 K/uL   Eosinophils Relative 0 %   Eosinophils Absolute 0.0 0.0 - 0.5 K/uL   Basophils Relative 0 %   Basophils Absolute 0.0 0.0 - 0.1 K/uL   Immature Granulocytes 1 %   Abs Immature Granulocytes 0.03 0.00 - 0.07 K/uL  Glucose, capillary     Status: Abnormal   Collection Time: 11/26/21 11:24 PM  Result Value Ref Range   Glucose-Capillary 145 (H) 70 - 99 mg/dL  Glucose, capillary     Status: Abnormal   Collection Time: 11/26/21 11:41 PM  Result Value Ref Range   Glucose-Capillary 141 (H) 70 - 99 mg/dL  Glucose, capillary     Status: Abnormal   Collection Time: 11/27/21  3:37 AM  Result Value Ref Range   Glucose-Capillary 129 (H) 70 - 99 mg/dL  APTT     Status: None   Collection Time: 11/27/21  4:45 AM  Result Value Ref Range   aPTT 36 24 - 36 seconds  Protime-INR     Status: Abnormal   Collection Time: 11/27/21  4:45 AM  Result Value Ref Range   Prothrombin Time 21.2 (H) 11.4 -  15.2 seconds   INR 1.9 (H) 0.8 - 1.2  Basic metabolic panel     Status: Abnormal   Collection Time: 11/27/21  4:45 AM  Result Value Ref Range   Sodium 151 (H) 135 - 145 mmol/L   Potassium 3.4 (L) 3.5 - 5.1 mmol/L   Chloride 114 (H) 98 - 111 mmol/L   CO2 33 (H) 22 - 32 mmol/L   Glucose, Bld 152 (H) 70 - 99 mg/dL   BUN 37 (H) 6 - 20 mg/dL   Creatinine, Ser 1.04 0.61 - 1.24 mg/dL   Calcium 11.3 (H) 8.9 - 10.3 mg/dL   GFR, Estimated >60 >60 mL/min   Anion gap 4 (L) 5 - 15  Magnesium     Status: Abnormal   Collection Time: 11/27/21  4:45 AM  Result Value Ref Range   Magnesium 2.9 (H) 1.7 - 2.4 mg/dL  Phosphorus     Status: Abnormal   Collection Time: 11/27/21  4:45 AM  Result Value Ref Range   Phosphorus 2.2 (L) 2.5 - 4.6 mg/dL  Fibrinogen     Status: Abnormal   Collection Time: 11/27/21  4:45 AM  Result Value Ref Range   Fibrinogen 135 (L) 210 - 475 mg/dL  Prepare cryoprecipitate     Status: None (Preliminary result)   Collection Time: 11/27/21  6:26 AM  Result Value Ref Range   Unit Number [id number]    Blood Component Type CRYPOOL THAW    Unit division 00    Status of Unit ISSUED    Transfusion Status      OK TO TRANSFUSE Performed at [facility], [address] [place], [place] [address]   Glucose, capillary     Status: Abnormal   Collection Time: 11/27/21  7:40 AM  Result Value Ref Range   Glucose-Capillary 145 (H) 70 - 99 mg/dL    SUBJECTIVE:  No acute events overnight.  No significant bleeding after repacked.  OBJECTIVE:  GEN-  supine and on ventilator and sedated NECK-  trach secure and patent.  PAcking from yesterday still in place and with minimal blood present  IMPRESSION:  s/p trach for supraglottic mass with pathology pending and cirrhosis with coagulopathy  PLAN:  Continues to be coagulopathic with intermittent bleeding when dressing changed.  Will leave dressing in placed and plan to change tomorrow and try and place surgicel in wound bed  again to see if we can control slow oozing that way.  Haytham Maher 11/27/2021, 8:14 AM

## 2021-11-27 NOTE — Progress Notes (Signed)
Patient remains on vent. Trach secure. Inner cannula changed. Per Dr Pryor Ochoa note, dressing change tomorrow. Per report, MD to change due to excessive bleeding. No distress noted at this time.

## 2021-11-27 NOTE — Consult Note (Signed)
Consultation Note Date: 11/27/2021 at 52  Patient Name: Jordan Hayden  DOB: [birth year]  MRN: [id number]  Age / Sex: 56 y.o., male  PCP: Srihari Body, MD Referring Physician: Armando Reichert, MD  Reason for Consultation: Establishing goals of care  HPI/Patient Profile: 56 y.o. male  with past medical history of HTN, OSA, morbid obesity, VitD deficiency, GERD, and advanced alcoholic cirrhosis of the liver admitted on 11/21/2021 with c/o coughing/spitting up blood.  CT revealed right sided supraglottic neck mass with narrowing of airway concerning for malignancy. Trach placed 11/17. Biopsy of mass still pending.   Clinical Assessment and Goals of Care: I have reviewed medical records including EPIC notes, labs and imaging, assessed the patient and then met with patient's sister Hassan Rowan outside of patient's earshot (at sister's request)  to discuss diagnosis prognosis, GOC, EOL wishes, disposition and options.  I introduced Palliative Medicine as specialized medical care for people living with serious illness. It focuses on providing relief from the symptoms and stress of a serious illness. The goal is to improve quality of life for both the patient and the family.  We discussed a brief life review of the patient. Patient has one son (age 76) and a long time girlfriend [employer] officer. Hassan Rowan describes the patient as rebellious and hard headed. She says he does not like doctors and needles. She and his girlfriend have been encouraging him to see a doctor about his swallowing issues and the lump in his neck for almost a year now. But, he was reluctant and did not want to see a doctor.  We discussed patient's current illness and what it means in the larger context of patient's on-going co-morbidities. I attempted to elicit values and goals of care important to the patient. Hassan Rowan shares she does not know the patient's  wishes but knows that his son will be making all decisions. Hassan Rowan is currently caring for her ill mother and has been the surrogate decision maker for several family members in the past. Several family members have had alcohol cirrhosis or colon cancer and passed in a similar way that the patient is appearing.   Hassan Rowan shares that patient's son knows the patient's situation is dire and that he will likely need hospice care. She shares the family is trying to ensure patient's mother gets home and well before having to make any big decisions for [place].   Hassan Rowan shares patient's son works night shift and will likely be available in the evenings or early mornings.    Education offered regarding concept specific to human mortality and the limitations of medical interventions to prolong life when the body begins to fail to thrive. Hassan Rowan shares she and the family are awaiting results of the biopsy to help determine next steps. However, she also stated she knows he has a bad liver and that he may not even be a candidate for chemo/curative treatment. We discussed QOL vs quantity of life as well as the goal of small pain for big gain.    Discussed with Hassan Rowan  the importance of continued conversation with family and the medical providers regarding overall plan of care and treatment options, ensuring decisions are within the context of the patient's values and GOCs.    Questions and concerns were addressed. No call placed to son yet. PMT will continue to follow the patient and support he and his family throughout this hospitalization.   Primary Decision Maker NEXT OF KIN  Physical Exam Vitals reviewed.  Constitutional:      General: He is not in acute distress.    Appearance: He is not toxic-appearing.  HENT:     Head: Normocephalic.  Cardiovascular:     Rate and Rhythm: Normal rate.     Pulses: Normal pulses.  Pulmonary:     Comments: trach Skin:    General: Skin is warm.  Neurological:      Comments: Did not respond to verbal or light physical stimuli     Palliative Assessment/Data: 30%     Thank you for this consult. Palliative medicine will continue to follow and assist holistically.   Time Total: 75 minutes Greater than 50%  of this time was spent counseling and coordinating care related to the above assessment and plan.  Signed by: Jordan Hawks, DNP, FNP-BC Palliative Medicine    Please contact Palliative Medicine Team phone at [phone] for questions and concerns.  For individual provider: See Shea Evans

## 2021-11-27 NOTE — Progress Notes (Signed)
NAME:  Jordan Hayden, MRN:  [id number], DOB:  15-Aug-1965, LOS: 4 ADMISSION DATE:  12/05/2021, CHIEF COMPLAINT:  Respiratory Failure  History of Present Illness:   56 year old male non-smoker with history of morbid obesity, and decompensated alcoholic cirrhosis(esophageal varices and hepatic encephalopathy) who presented to the hospital after an episode of hematemesis and worsening dysphagia.  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 There was also concern for malignant adenopathy in the neck with compression of the right internal jugular vein. He was evaluated by ENT and underwent urgent tracheostomy on 12/02/2021 for impending airway closure. GI was also consulted for concern for hematemesis in the setting of advanced cirrhosis. He was initially on PPI & Octreotide gtt, transitioned to PPI only.  Following tracheostomy, he's continued to have bleeding around the tracheostomy site and has received platelets, Vitamin K, FFP's and cryoprecipitate. ENT continues to follow his tracheostomy site for bleeding (which is currently packed). He continues to be coagulopathic secondary to his liver disease and received cryoprecipitate this AM.  Significant Hospital Events: Including procedures, antibiotic start and stop dates in addition to other pertinent events   12/05/2021: Admitted 11/15/2021: Tracheostomy placed by ENT. Aline and CVC placed  Interim History / Subjective:  Patient intubated and non-responsive today  Objective   Blood pressure 113/62, pulse (!) 58, temperature (!) 97.5 F (36.4 C), resp. rate 18, height '5\' 8"'$  (1.727 m), weight 128.1 kg, SpO2 96 %.    Vent Mode: PRVC FiO2 (%):  [28 %] 28 % Set Rate:  [16 bmp] 16 bmp Vt Set:  [500 mL] 500 mL PEEP:  [5 cmH20] 5 cmH20 Plateau Pressure:  [16  cmH20] 16 cmH20   Intake/Output Summary (Last 24 hours) at 11/27/2021 1138 Last data filed at 11/27/2021 1125 Gross per 24 hour  Intake 2125.43 ml  Output 2025 ml  Net 100.43 ml   Filed Weights   11/29/2021 1416 11/13/2021 2000 11/27/21 0400  Weight: 123.9 kg 122.1 kg 128.1 kg    Examination: Physical Exam Constitutional:      General: He is not in acute distress.    Appearance: He is obese. He is ill-appearing.  HENT:     Head: Normocephalic.     Mouth/Throat:     Mouth: Mucous membranes are moist.  Eyes:     Extraocular Movements: Extraocular movements intact.  Neck:     Comments: Tracheostomy tube in place, packed inferiorly Pulmonary:     Comments: Ventilated breath sounds bilaterally Abdominal:     General: There is distension.     Palpations: Abdomen is soft.  Musculoskeletal:     Right lower leg: Edema present.     Left lower leg: Edema present.  Neurological:     Mental Status: He is disoriented.     Assessment & Plan:   56 year old male with history of decompensated liver cirrhosis presents with hemoptysis/hematemesis and dysphagia, found to have a large supraglotic neck mass compromising the airway and requiring emergent tracheostomy tube placement.  Neurology #Toxic Metabolic Encephalopathy #Sedation  Encephalopathy in the setting of sedation and hepatic encephalopathy. Will initiate rectal lactulose for management of hepatic encephalopathy. Patient was on fentanyl and midazolam for sedation which we will adjust by discontinuing the versed and use of propofol or dexmedetomidine for sedation.  -goal RASS 0 -  d/c midazolam -initiate lactulose retention enema  Respiratory #Acute Hypoxic Respiratory Failure #Supraglottic tumor  S/p tracheostomy in the setting of a supraglottic mass with impending respiratory failure. On Minimal ventilator settings with current barrier to liberation is mental status. Will attempt to wean sedation and attempt PSV followed by  trach collar once tolerates.  Cardiovascular #Hemorrhagic Shock - resolved  Hemodynamically stable with MAP's in the 70's and not requiring any vasopressor support. Continue to monitor.  GI #Decompensated Liver Cirrhosis #Esophageal Varices  MELD-Na 25 and Child's Pugh Class C. Currently sedated and unable to assess for symptoms of hepatic encephalopathy, though that has been described in the record. Will initiate lactulose rectally and continue PPI. Does have a liver lesion that is concerning for metastatic malignancy vs HCC, albeit high risk for biopsy given his coagulopathy at this moment. Would not be a liver transplant candidate if neck mass malignant. Unable to place NG or OG tube given supraglottic tumor, will have to consider PEG if in line with goals of care.  -Octreotide has been discontinued -GI consult highly appreciated. -rectal lactulose.  Renal #AKI - resolving #Hypernatremia  Kidney function has improved, will continue to avoid nephrotoxic medications and replete electrolytes (K and Phos). Patient is hypernatremic and will benefit from the initiation of a D5 infusion with a goal sodium of 145.  Endo #Type 2 Diabetes  On Insulin sliding scale. Goal glucose 140-180  Hematology/Oncology #Thrombocytopenia #Coagulopathy  Continues to bleed following tracheostomy, with signs of chronic DIC (likely secondary to liver cirrhosis). Has received blood products and cryoprecipitate this morning. Will monitor markers of DIC (fibrinogen, D-dimer, PT/PTT - FDP's not available in our lab) and replete as indicated. Highly appreciate hematology consult.  Infectious Diseases  On Ceftriaxone for SBP prophylaxis.   Best Practice (right click and "Reselect all SmartList Selections" daily)   Diet/type: NPO DVT prophylaxis: other GI prophylaxis: PPI Lines: Central line and No longer needed.  Order written to d/c  Foley:  Yes, and it is still needed and removal ordered  Code  Status:  full code Last date of multidisciplinary goals of care discussion [11/27/2021]  Labs   CBC: Recent Labs  Lab 11/20/2021 1422 12/02/2021 2010 12/02/2021 1014 11/18/2021 1959 11/25/21 0457 11/26/21 0418 11/26/21 2115  WBC 6.5   < > 9.6 5.8 7.6 3.8* 3.8*  NEUTROABS 4.4  --   --   --   --  3.2 3.1  HGB 12.6*   < > 9.1* 6.9* 8.0* 7.2* 7.4*  HCT 38.8*   < > 27.2* 20.7* 23.7* 21.9* 22.7*  MCV 102.6*   < > 101.5* 101.5* 100.4* 102.3* 102.3*  PLT 55*   < > 45* 39* 72* 36* 49*   < > = values in this interval not displayed.    Basic Metabolic Panel: Recent Labs  Lab 11/16/2021 1422 12/01/2021 0311 11/25/21 0457 11/25/21 1641 11/26/21 0417 11/27/21 0445  NA 144 144 146* 146* 146* 151*  K 5.5* 5.1 3.9 3.8 3.5 3.4*  CL 106 103 109 109 110 114*  CO2 '29 29 29 25 29 '$ 33*  GLUCOSE 111* 144* 150* 171* 145* 152*  BUN 22* 32* 43* 43* 42* 37*  CREATININE 1.40* 2.29* 1.71* 1.45* 1.24 1.04  CALCIUM 12.2* 12.9* 12.1* 12.5* 11.8* 11.3*  MG 2.6*  --  2.6*  --  2.8* 2.9*  PHOS  --   --  2.9  --  2.4* 2.2*   GFR: Estimated Creatinine Clearance: 106 mL/min (by C-G formula based on  SCr of 1.04 mg/dL). Recent Labs  Lab 12/02/2021 1959 11/25/21 0457 11/26/21 0418 11/26/21 2115  WBC 5.8 7.6 3.8* 3.8*    Liver Function Tests: Recent Labs  Lab 11/22/2021 1422 11/19/2021 0311 11/25/21 0457  AST 68* 80* 74*  ALT 30 34 29  ALKPHOS 89 104 73  BILITOT 4.4* 4.9* 4.9*  PROT 6.1* 6.8 6.0*  ALBUMIN 2.1* 2.3* 3.2*   No results for input(s): "LIPASE", "AMYLASE" in the last 168 hours. Recent Labs  Lab 12/06/2021 1422 11/21/2021 2028 11/25/21 0457  AMMONIA 48* 33 32    ABG    Component Value Date/Time   PHART 7.45 12/06/2021 1549   PCO2ART 43 11/29/2021 1549   PO2ART 101 11/19/2021 1549   HCO3 29.9 (H) 12/01/2021 1549   O2SAT 99.2 11/21/2021 1549     Coagulation Profile: Recent Labs  Lab 11/27/2021 1014 11/16/2021 1959 11/25/21 0457 11/26/21 0417 11/27/21 0445  INR 3.0* 2.0* 1.9* 2.2* 1.9*     Cardiac Enzymes: No results for input(s): "CKTOTAL", "CKMB", "CKMBINDEX", "TROPONINI" in the last 168 hours.  HbA1C: No results found for: "HGBA1C"  CBG: Recent Labs  Lab 11/26/21 2324 11/26/21 2341 11/27/21 0337 11/27/21 0740 11/27/21 1122  GLUCAP 145* 141* 129* 145* 167*    Review of Systems:   Unable to obtain  Past Medical History:  He,  has a past medical history of Alcohol abuse, Alcoholic cirrhosis ([place]), Alcoholism ([place]), Arthritis, and Cirrhosis ([place]).   Surgical History:   Past Surgical History:  Procedure Laterality Date   DIRECT LARYNGOSCOPY  11/12/2021   Procedure: DIRECT LARYNGOSCOPY WITH BIOPSIES;  Surgeon: Carloyn Manner, MD;  Location: [facility] ORS;  Service: ENT;;   ESOPHAGOGASTRODUODENOSCOPY (EGD) WITH PROPOFOL N/A 07/01/2017   Procedure: ESOPHAGOGASTRODUODENOSCOPY (EGD) WITH PROPOFOL;  Surgeon: Lucilla Lame, MD;  Location: [facility] ENDOSCOPY;  Service: Endoscopy;  Laterality: N/A;   TRACHEOSTOMY TUBE PLACEMENT N/A 11/30/2021   Procedure: AWAKE TRACHEOSTOMY;  Surgeon: Carloyn Manner, MD;  Location: [facility] ORS;  Service: ENT;  Laterality: N/A;     Social History:   reports that he has never smoked. He has never used smokeless tobacco. He reports that he does not drink alcohol and does not use drugs.   Family History:  His family history includes Cervical cancer in his sister; Cirrhosis in his brother; Diabetes in his father; Hypertension in his mother.   Allergies Allergies  Allergen Reactions   Hydrocodone-Acetaminophen Swelling    Mouth Swelling    Penicillins Swelling     Home Medications  Prior to Admission medications   Medication Sig Start Date End Date Taking? Authorizing Provider  chlorhexidine (PERIDEX) 0.12 % solution Use as directed 5 mLs in the mouth or throat 2 (two) times daily. 11/16/21  Yes [provider]  albuterol (VENTOLIN HFA) 108 (90 Base) MCG/ACT inhaler Inhale into the lungs. Patient not taking: Reported on  11/30/2021 02/02/21 02/02/22  [provider]  aspirin EC 81 MG tablet Take 81 mg by mouth daily. Patient not taking: Reported on 07/25/2021 03/05/16   [provider]  doxycycline (VIBRAMYCIN) 100 MG capsule Take 100 mg by mouth 2 (two) times daily. Patient not taking: Reported on 11/26/2021 07/19/21   [provider]  ferrous sulfate 325 (65 FE) MG tablet Take 325 mg by mouth every morning. 06/05/20   [provider]  furosemide (LASIX) 20 MG tablet Take 1 tablet (20 mg total) by mouth daily. 07/02/17 08/31/20  Salary, Holly Bodily D, MD  hydrOXYzine (ATARAX/VISTARIL) 25 MG tablet Take 25 mg  by mouth 3 (three) times daily as needed. 04/01/17   [provider]  lactulose (CHRONULAC) 10 GM/15ML solution Take 30 mLs (20 g total) by mouth 3 (three) times daily. 08/31/20   Ezekiel Slocumb, DO  meloxicam (MOBIC) 15 MG tablet Take 1 tablet (15 mg total) by mouth daily. 07/22/18   Cuthriell, Charline Bills, PA-C  pantoprazole (PROTONIX) 40 MG tablet Take 1 tablet (40 mg total) by mouth daily. 07/02/17 08/31/20  Salary, Avel Peace, MD  potassium chloride SA (K-DUR,KLOR-CON) 20 MEQ tablet Take 2 tablets (40 mEq total) by mouth 2 (two) times daily. 07/02/17   Salary, Avel Peace, MD  spironolactone (ALDACTONE) 25 MG tablet Take 0.5 tablets (12.5 mg total) by mouth 2 (two) times daily. 07/02/17   Salary, Avel Peace, MD  traMADol (ULTRAM) 50 MG tablet Take 50 mg by mouth every 6 (six) hours as needed for moderate pain. Patient not taking: Reported on 11/13/2021 11/16/21   [provider]  XIFAXAN 550 MG TABS tablet Take 550 mg by mouth 2 (two) times daily. 08/08/20   [provider]     Critical care time: 57 minutes

## 2021-11-28 ENCOUNTER — Other Ambulatory Visit: Payer: Self-pay | Admitting: Anatomic Pathology & Clinical Pathology

## 2021-11-28 ENCOUNTER — Telehealth: Payer: Self-pay

## 2021-11-28 DIAGNOSIS — K7460 Unspecified cirrhosis of liver: Secondary | ICD-10-CM | POA: Diagnosis not present

## 2021-11-28 DIAGNOSIS — N179 Acute kidney failure, unspecified: Secondary | ICD-10-CM | POA: Diagnosis not present

## 2021-11-28 DIAGNOSIS — D696 Thrombocytopenia, unspecified: Secondary | ICD-10-CM | POA: Diagnosis not present

## 2021-11-28 DIAGNOSIS — J9601 Acute respiratory failure with hypoxia: Secondary | ICD-10-CM | POA: Diagnosis not present

## 2021-11-28 DIAGNOSIS — R221 Localized swelling, mass and lump, neck: Secondary | ICD-10-CM | POA: Diagnosis not present

## 2021-11-28 DIAGNOSIS — D500 Iron deficiency anemia secondary to blood loss (chronic): Secondary | ICD-10-CM

## 2021-11-28 DIAGNOSIS — D689 Coagulation defect, unspecified: Secondary | ICD-10-CM

## 2021-11-28 DIAGNOSIS — C321 Malignant neoplasm of supraglottis: Secondary | ICD-10-CM

## 2021-11-28 DIAGNOSIS — Z515 Encounter for palliative care: Secondary | ICD-10-CM | POA: Diagnosis not present

## 2021-11-28 DIAGNOSIS — Z7189 Other specified counseling: Secondary | ICD-10-CM

## 2021-11-28 LAB — CBC WITH DIFFERENTIAL/PLATELET
Abs Immature Granulocytes: 0.04 10*3/uL (ref 0.00–0.07)
Abs Immature Granulocytes: 0.07 10*3/uL (ref 0.00–0.07)
Basophils Absolute: 0 10*3/uL (ref 0.0–0.1)
Basophils Absolute: 0 10*3/uL (ref 0.0–0.1)
Basophils Relative: 0 %
Basophils Relative: 0 %
Eosinophils Absolute: 0 10*3/uL (ref 0.0–0.5)
Eosinophils Absolute: 0.1 10*3/uL (ref 0.0–0.5)
Eosinophils Relative: 1 %
Eosinophils Relative: 1 %
HCT: 23.6 % — ABNORMAL LOW (ref 39.0–52.0)
HCT: 25.5 % — ABNORMAL LOW (ref 39.0–52.0)
Hemoglobin: 7.8 g/dL — ABNORMAL LOW (ref 13.0–17.0)
Hemoglobin: 8.3 g/dL — ABNORMAL LOW (ref 13.0–17.0)
Immature Granulocytes: 1 %
Immature Granulocytes: 2 %
Lymphocytes Relative: 15 %
Lymphocytes Relative: 15 %
Lymphs Abs: 0.6 10*3/uL — ABNORMAL LOW (ref 0.7–4.0)
Lymphs Abs: 0.7 10*3/uL (ref 0.7–4.0)
MCH: 34.7 pg — ABNORMAL HIGH (ref 26.0–34.0)
MCH: 34 pg (ref 26.0–34.0)
MCHC: 33.1 g/dL (ref 30.0–36.0)
MCHC: 32.5 g/dL (ref 30.0–36.0)
MCV: 104.9 fL — ABNORMAL HIGH (ref 80.0–100.0)
MCV: 104.5 fL — ABNORMAL HIGH (ref 80.0–100.0)
Monocytes Absolute: 0.3 10*3/uL (ref 0.1–1.0)
Monocytes Absolute: 0.4 10*3/uL (ref 0.1–1.0)
Monocytes Relative: 8 %
Monocytes Relative: 8 %
Neutro Abs: 2.9 10*3/uL (ref 1.7–7.7)
Neutro Abs: 3.5 10*3/uL (ref 1.7–7.7)
Neutrophils Relative %: 75 %
Neutrophils Relative %: 74 %
Platelets: 49 10*3/uL — ABNORMAL LOW (ref 150–400)
Platelets: 43 10*3/uL — ABNORMAL LOW (ref 150–400)
RBC: 2.25 MIL/uL — ABNORMAL LOW (ref 4.22–5.81)
RBC: 2.44 MIL/uL — ABNORMAL LOW (ref 4.22–5.81)
RDW: 17.2 % — ABNORMAL HIGH (ref 11.5–15.5)
RDW: 17.3 % — ABNORMAL HIGH (ref 11.5–15.5)
Smear Review: NORMAL
WBC: 3.8 10*3/uL — ABNORMAL LOW (ref 4.0–10.5)
WBC: 4.7 10*3/uL (ref 4.0–10.5)
nRBC: 3.4 % — ABNORMAL HIGH (ref 0.0–0.2)
nRBC: 5.4 % — ABNORMAL HIGH (ref 0.0–0.2)

## 2021-11-28 LAB — BPAM CRYOPRECIPITATE
Blood Product Expiration Date: 202311171617
Blood Product Expiration Date: 202311171617
Blood Product Expiration Date: 202311201237
Blood Product Expiration Date: 202311202357
Blood Product Expiration Date: 202311210018
ISSUE DATE / TIME: 202311171053
ISSUE DATE / TIME: 202311171053
ISSUE DATE / TIME: 202311200810
ISSUE DATE / TIME: 202311201836
ISSUE DATE / TIME: 202311202056
Unit Type and Rh: 5100
Unit Type and Rh: 5100
Unit Type and Rh: 5100
Unit Type and Rh: 5100
Unit Type and Rh: 5100

## 2021-11-28 LAB — BLOOD GAS, ARTERIAL
Acid-Base Excess: 5.2 mmol/L — ABNORMAL HIGH (ref 0.0–2.0)
Bicarbonate: 29.9 mmol/L — ABNORMAL HIGH (ref 20.0–28.0)
FIO2: 40 %
MECHVT: 500 mL
Mechanical Rate: 16
O2 Saturation: 99.2 %
PEEP: 5 cmH2O
Patient temperature: 37
pCO2 arterial: 43 mmHg (ref 32–48)
pH, Arterial: 7.45 (ref 7.35–7.45)
pO2, Arterial: 101 mmHg (ref 83–108)

## 2021-11-28 LAB — BASIC METABOLIC PANEL
Anion gap: 6 (ref 5–15)
BUN: 30 mg/dL — ABNORMAL HIGH (ref 6–20)
CO2: 31 mmol/L (ref 22–32)
Calcium: 10.5 mg/dL — ABNORMAL HIGH (ref 8.9–10.3)
Chloride: 114 mmol/L — ABNORMAL HIGH (ref 98–111)
Creatinine, Ser: 0.73 mg/dL (ref 0.61–1.24)
GFR, Estimated: >60 mL/min (ref >60)
Glucose, Bld: 136 mg/dL — ABNORMAL HIGH (ref 70–99)
Potassium: 3.2 mmol/L — ABNORMAL LOW (ref 3.5–5.1)
Sodium: 151 mmol/L — ABNORMAL HIGH (ref 135–145)

## 2021-11-28 LAB — PREPARE CRYOPRECIPITATE
Unit division: 0
Unit division: 0
Unit division: 0
Unit division: 0
Unit division: 0

## 2021-11-28 LAB — GLUCOSE, CAPILLARY
Glucose-Capillary: 128 mg/dL — ABNORMAL HIGH (ref 70–99)
Glucose-Capillary: 129 mg/dL — ABNORMAL HIGH (ref 70–99)
Glucose-Capillary: 125 mg/dL — ABNORMAL HIGH (ref 70–99)
Glucose-Capillary: 116 mg/dL — ABNORMAL HIGH (ref 70–99)
Glucose-Capillary: 120 mg/dL — ABNORMAL HIGH (ref 70–99)
Glucose-Capillary: 119 mg/dL — ABNORMAL HIGH (ref 70–99)

## 2021-11-28 LAB — PATHOLOGIST SMEAR REVIEW

## 2021-11-28 LAB — BPAM PLATELET PHERESIS
Blood Product Expiration Date: 202311222359
ISSUE DATE / TIME: 202311202213
Unit Type and Rh: 6200

## 2021-11-28 LAB — PREPARE PLATELET PHERESIS: Unit division: 0

## 2021-11-28 LAB — HEMOGLOBIN A1C
Hgb A1c MFr Bld: 4.4 % — ABNORMAL LOW (ref 4.8–5.6)
Mean Plasma Glucose: 80 mg/dL

## 2021-11-28 LAB — TYPE AND SCREEN
ABO/RH(D): B POS
Antibody Screen: NEGATIVE

## 2021-11-28 LAB — FIBRINOGEN: Fibrinogen: 192 mg/dL — ABNORMAL LOW (ref 210–475)

## 2021-11-28 LAB — PROTIME-INR
INR: 1.8 — ABNORMAL HIGH (ref 0.8–1.2)
Prothrombin Time: 20.6 seconds — ABNORMAL HIGH (ref 11.4–15.2)

## 2021-11-28 LAB — PHOSPHORUS: Phosphorus: 1.7 mg/dL — ABNORMAL LOW (ref 2.5–4.6)

## 2021-11-28 LAB — MAGNESIUM: Magnesium: 2.7 mg/dL — ABNORMAL HIGH (ref 1.7–2.4)

## 2021-11-28 LAB — APTT: aPTT: 39 seconds — ABNORMAL HIGH (ref 24–36)

## 2021-11-28 MED ORDER — POTASSIUM CHLORIDE 10 MEQ/100ML IV SOLN
10.0000 meq | INTRAVENOUS | Status: AC
  Administered 2021-11-28 (×2): 10 meq via INTRAVENOUS
  Filled 2021-11-28 (×2): qty 100

## 2021-11-28 MED ORDER — POTASSIUM PHOSPHATES 15 MMOLE/5ML IV SOLN
30.0000 mmol | Freq: Once | INTRAVENOUS | Status: AC
  Administered 2021-11-28: 30 mmol via INTRAVENOUS
  Filled 2021-11-28: qty 10

## 2021-11-28 MED ORDER — POTASSIUM CHLORIDE 20 MEQ PO PACK: 20.0000 meq | PACK | Freq: Once | ORAL | Status: DC

## 2021-11-28 NOTE — IPAL (Signed)
  Interdisciplinary Goals of Care Family Meeting   Date carried out: 11/28/2021  Location of the meeting: Unit  Member's involved: Physician and Family Member or next of kin  Durable Power of Attorney or acting medical decision maker: Son, Devariae, sister, girlfriend, and two nieces.    Discussion: We discussed goals of care for [name] .  I explained to them the gravity and complexity of Jordan Hayden's medical condition. 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 Explained that his liver disease significantly limits his options for chemotherapy, and that malignancy precludes him from being a liver transplant candidate. We also discussed the complexity of obtaining enteral access. They overall understand his prognosis is extremely poor, that he would not tolerate cancer targeted therapy, and that his likelihood of being discharged from the hospital is dismal. We discussed options moving forward, including change to goals of care. Family is overall understanding but needs time to talk it over amongst themselves and to process the situation.  Code status: Full Code for now, will revisit tomorrow and through the week with consideration for comfort measures.  Disposition: Continue current acute care  Time spent for the meeting: 35 minutes    Jordan Reichert, MD

## 2021-11-28 NOTE — Plan of Care (Signed)
Discussed with niece in front pf patient plan of care for the evening, pain management and answered her basic questions about the care I was preforming at the time.  Asked that family visit him 1st before seeing mom (who is COVID+) to decrease his risk.  Problem: Education: Goal: Knowledge of General Education information will improve Description: Including pain rating scale, medication(s)/side effects and non-pharmacologic comfort measures Outcome: Progressing

## 2021-11-28 NOTE — Progress Notes (Signed)
NAME:  Jordan Hayden, MRN:  [id number], DOB:  1965-12-07, LOS: 5 ADMISSION DATE:  11/12/2021, CHIEF COMPLAINT:  Respiratory Failure  History of Present Illness:   56 year old male non-smoker with history of morbid obesity, and decompensated alcoholic cirrhosis(esophageal varices and hepatic encephalopathy) who presented to the hospital after an episode of hematemesis and worsening dysphagia.  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 There was also concern for malignant adenopathy in the neck with compression of the right internal jugular vein. He was evaluated by ENT and underwent urgent tracheostomy on 11/14/2021 for impending airway closure. GI was also consulted for concern for hematemesis in the setting of advanced cirrhosis. He was initially on PPI & Octreotide gtt, transitioned to PPI only.  Following tracheostomy, he's continued to have bleeding around the tracheostomy site and has received platelets, Vitamin K, FFP's and cryoprecipitate. ENT continues to follow his tracheostomy site for bleeding (which is currently packed). He continues to be coagulopathic secondary to his liver disease and received cryoprecipitate this AM.  Significant Hospital Events: Including procedures, antibiotic start and stop dates in addition to other pertinent events   11/16/2021: Admitted 11/13/2021: Tracheostomy placed by ENT. Aline and CVC placed 11/27/2021: Pt received 3 pools of cryoprecipitate due to fibrinogen level of 135  11/28/2021: Pts mentation slowly improving able to open eyes to voice; attempted SBT however pt failed placed back on full support FiO2 28%/PEEP 5   Interim History / Subjective:  As outlined above   Objective   Blood pressure 118/69, pulse (!) 57, temperature (P) 97.6 F (36.4 C), resp.  rate 19, height _0  (1.727 m), weight 128.1 kg, SpO2 100 %.    Vent Mode: PRVC FiO2 (%):  [28 %] 28 % Set Rate:  [16 bmp] 16 bmp Vt Set:  [500 mL] 500 mL PEEP:  [5 cmH20] 5 cmH20 Plateau Pressure:  [15 cmH20-16 cmH20] 16 cmH20   Intake/Output Summary (Last 24 hours) at 11/28/2021 0749 Last data filed at 11/28/2021 4098 Gross per 24 hour  Intake 2601.99 ml  Output 1900 ml  Net 701.99 ml    Filed Weights   11/14/2021 2000 11/27/21 0400 11/28/21 0500  Weight: 122.1 kg 128.1 kg 128.1 kg    Examination: General: Acute on chronically-ill appearing male, NAD mechanically intubated  HENT: Atraumatic, normocephalic, right neck mass, size 8 mm trach midline no signs of active bleeding  Lungs: Faint rhonchi, even, non labored  Cardiovascular: Sinus bradycardia, no r/g, 2+ radial/1+ distal pulses, 2+ bilateral lower extremity pitting edema  Abdomen: +BS x4, obese, soft, non distended  Extremities: Normal tone  Neuro: Opens eyes to voice however unable to follow commands, PERRL GU: External catheter present draining urine   Assessment & Plan:  56 year old male with history of decompensated liver cirrhosis presents with hemoptysis/hematemesis and dysphagia, found to have a large supraglotic neck mass compromising the airway and requiring emergent tracheostomy tube placement.  Hepatic encephalopathy  - Continue lactulose per rectum  - Avoid sedating medication when able   Acute Hypoxic Respiratory Failure s/p tracheostomy in the setting of a supraglottic mass with impending respiratory failure  - Full vent support for now: vent settings reviewed and established - Continue lung protective strategies - SBT once all parameters met  - Continue prn bronchodilator therapy  -  ENT consulted appreciate input   Hemorrhagic Shock - resolved - Continuous telemetry monitoring  - Maintain map >65  Decompensated Liver Cirrhosis Esophageal Varices Unable to place NG or OG tube given supraglottic  tumor, will have to consider PEG if in line with goals of care - GI consulted appreciate input  - Continue protonix q12hrs  - Continue ceftriaxone empirically for SBP   AKI-resolved Hypernatremia Hypokalemia  Hypophosphatemia  - Trend BMP  - Replace electrolytes as indicated  - Monitor UOP - Increase D5W from 50 ml/hr to 75 ml/hr   Thrombocytopenia, Coagulopathy, and 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  - Trend CBC, coags, and fibrinogen levels  - Monitor for s/sx of bleeding  - Maintain fibrinogen level above 150, hgb >7, and platelet count 20,000 - Hematology consulted appreciate input  Type 2 Diabetes - Continue SSI  - CBG's q4hrs   Best Practice (right click and "Reselect all SmartList Selections" daily)   Diet/type: NPO DVT prophylaxis: SCD's  GI prophylaxis: PPI Lines: Left femoral CVL plans to remove  Foley:  Yes, and it is still needed and removal ordered  Code Status:  full code Last date of multidisciplinary goals of care discussion [11/28/2021]  Pts son updated via telephone by Dr. Genia Harold on 11/28/21 Labs   CBC: Recent Labs  Lab 11/30/2021 1422 11/20/2021 2010 11/25/21 0457 11/26/21 0418 11/26/21 2115 11/27/21 1258 11/28/21 0314  WBC 6.5   < > 7.6 3.8* 3.8* 3.2* 3.8*  NEUTROABS 4.4  --   --  3.2 3.1 2.5 2.9  HGB 12.6*   < > 8.0* 7.2* 7.4* 7.5* 7.8*  HCT 38.8*   < > 23.7* 21.9* 22.7* 22.9* 23.6*  MCV 102.6*   < > 100.4* 102.3* 102.3* 102.2* 104.9*  PLT 55*   < > 72* 36* 49* 39* 49*   < > = values in this interval not displayed.     Basic Metabolic Panel: Recent Labs  Lab 11/08/2021 1422 11/28/2021 0311 11/25/21 0457 11/25/21 1641 11/26/21 0417 11/27/21 0445 11/28/21 0314  NA 144   < > 146* 146* 146* 151* 151*  K 5.5*   < > 3.9 3.8 3.5 3.4* 3.2*  CL 106   < > 109 109 110 114* 114*  CO2 29   < > _0 33* 31  GLUCOSE 111*   < > 150* 171* 145* 152* 136*  BUN 22*   < > 43* 43* 42* 37* 30*  CREATININE 1.40*   < > 1.71* 1.45* 1.24 1.04 0.73   CALCIUM 12.2*   < > 12.1* 12.5* 11.8* 11.3* 10.5*  MG 2.6*  --  2.6*  --  2.8* 2.9* 2.7*  PHOS  --   --  2.9  --  2.4* 2.2* 1.7*   < > = values in this interval not displayed.    GFR: Estimated Creatinine Clearance: 137.8 mL/min (by C-G formula based on SCr of 0.73 mg/dL). Recent Labs  Lab 11/26/21 0418 11/26/21 2115 11/27/21 1258 11/28/21 0314  WBC 3.8* 3.8* 3.2* 3.8*     Liver Function Tests: Recent Labs  Lab 11/10/2021 1422 11/14/2021 0311 11/25/21 0457  AST 68* 80* 74*  ALT 30 34 29  ALKPHOS 89 104 73  BILITOT 4.4* 4.9* 4.9*  PROT 6.1* 6.8 6.0*  ALBUMIN 2.1* 2.3* 3.2*    No results for input(s): "LIPASE", "AMYLASE" in the last 168 hours. Recent Labs  Lab 11/11/2021 1422 11/26/2021 2028 11/25/21 0457  AMMONIA 48* 33 32  ABG    Component Value Date/Time   PHART 7.45 12/07/2021 1549   PCO2ART 43 11/27/2021 1549   PO2ART 101 12/07/2021 1549   HCO3 29.9 (H) 11/11/2021 1549   O2SAT 99.2 12/05/2021 1549     Coagulation Profile: Recent Labs  Lab 11/21/2021 1959 11/25/21 0457 11/26/21 0417 11/27/21 0445 11/28/21 0314  INR 2.0* 1.9* 2.2* 1.9* 1.8*     Cardiac Enzymes: No results for input(s): "CKTOTAL", "CKMB", "CKMBINDEX", "TROPONINI" in the last 168 hours.  HbA1C: No results found for: "HGBA1C"  CBG: Recent Labs  Lab 11/27/21 1122 11/27/21 1522 11/27/21 1934 11/27/21 2300 11/28/21 0317  GLUCAP 167* 156* 134* 131* 128*     Review of Systems:   Unable to obtain  Past Medical History:  He,  has a past medical history of Alcohol abuse, Alcoholic cirrhosis ([place]), Alcoholism ([place]), Arthritis, and Cirrhosis ([place]).   Surgical History:   Past Surgical History:  Procedure Laterality Date   DIRECT LARYNGOSCOPY  11/21/2021   Procedure: DIRECT LARYNGOSCOPY WITH BIOPSIES;  Surgeon: Carloyn Manner, MD;  Location: [facility] ORS;  Service: ENT;;   ESOPHAGOGASTRODUODENOSCOPY (EGD) WITH PROPOFOL N/A 07/01/2017   Procedure: ESOPHAGOGASTRODUODENOSCOPY  (EGD) WITH PROPOFOL;  Surgeon: Lucilla Lame, MD;  Location: [facility] ENDOSCOPY;  Service: Endoscopy;  Laterality: N/A;   TRACHEOSTOMY TUBE PLACEMENT N/A 12/02/2021   Procedure: AWAKE TRACHEOSTOMY;  Surgeon: Carloyn Manner, MD;  Location: [facility] ORS;  Service: ENT;  Laterality: N/A;     Social History:   reports that he has never smoked. He has never used smokeless tobacco. He reports that he does not drink alcohol and does not use drugs.   Family History:  His family history includes Cervical cancer in his sister; Cirrhosis in his brother; Diabetes in his father; Hypertension in his mother.   Allergies Allergies  Allergen Reactions   Hydrocodone-Acetaminophen Swelling    Mouth Swelling    Penicillins Swelling     Home Medications  Prior to Admission medications   Medication Sig Start Date End Date Taking? Authorizing Provider  chlorhexidine (PERIDEX) 0.12 % solution Use as directed 5 mLs in the mouth or throat 2 (two) times daily. 11/16/21  Yes [provider]  albuterol (VENTOLIN HFA) 108 (90 Base) MCG/ACT inhaler Inhale into the lungs. Patient not taking: Reported on 11/13/2021 02/02/21 02/02/22  [provider]  aspirin EC 81 MG tablet Take 81 mg by mouth daily. Patient not taking: Reported on 07/25/2021 03/05/16   [provider]  doxycycline (VIBRAMYCIN) 100 MG capsule Take 100 mg by mouth 2 (two) times daily. Patient not taking: Reported on 11/21/2021 07/19/21   [provider]  ferrous sulfate 325 (65 FE) MG tablet Take 325 mg by mouth every morning. 06/05/20   [provider]  furosemide (LASIX) 20 MG tablet Take 1 tablet (20 mg total) by mouth daily. 07/02/17 08/31/20  Salary, Holly Bodily D, MD  hydrOXYzine (ATARAX/VISTARIL) 25 MG tablet Take 25 mg by mouth 3 (three) times daily as needed. 04/01/17   [provider]  lactulose (CHRONULAC) 10 GM/15ML solution Take 30 mLs (20 g total) by mouth 3 (three) times daily. 08/31/20   Ezekiel Slocumb, DO  meloxicam (MOBIC) 15 MG tablet Take 1 tablet (15 mg total) by mouth daily. 07/22/18   Cuthriell, Charline Bills, PA-C  pantoprazole (PROTONIX) 40 MG tablet Take 1 tablet (40 mg total) by mouth daily. 07/02/17 08/31/20  Salary, Holly Bodily D, MD  potassium chloride SA (K-DUR,KLOR-CON) 20 MEQ tablet Take 2 tablets (40 mEq total)  by mouth 2 (two) times daily. 07/02/17   Salary, Avel Peace, MD  spironolactone (ALDACTONE) 25 MG tablet Take 0.5 tablets (12.5 mg total) by mouth 2 (two) times daily. 07/02/17   Salary, Avel Peace, MD  traMADol (ULTRAM) 50 MG tablet Take 50 mg by mouth every 6 (six) hours as needed for moderate pain. Patient not taking: Reported on 12/07/2021 11/16/21   [provider]  XIFAXAN 550 MG TABS tablet Take 550 mg by mouth 2 (two) times daily. 08/08/20   [provider]     Critical care time: 45 minutes    Donell Beers, [place] Pager [phone] (please enter 7 digits) PCCM Consult Pager [phone] (please enter 7 digits)

## 2021-11-28 NOTE — Consult Note (Signed)
Interventional Radiology was consulted for a percutaneous gastrostomy tube placement.  Reviewed chart and imaging.  There are multiple issues with placing a percutaneous gastrostomy in this patient.  First, we would need to place a small bore NGT to insufflate the stomach but based on neck CT, not sure we could place a NGT without endoscopic guidance.  Second, patient has decompensated cirrhosis with thrombocytopenia and coagulopathy.  Fortunately, there doesn't appear to be ascites on recent RUQ [place].  Consider surgical consultation for open gastrostomy or jejunostomy tube placement or consider having GI try to place a NGT tube with endoscopy.   No plans for percutaneous gastrostomy tube placement at this time. Discussed with Dr. Genia Harold and Dr. Tasia Catchings.

## 2021-11-28 NOTE — Telephone Encounter (Signed)
Request for PDL1 CPS -head and neck testing addon has been faxed to path lab on specimen: [phone].

## 2021-11-28 NOTE — Progress Notes (Signed)
                                                     Palliative Care Progress Note, Assessment & Plan   Patient Name: Jordan Hayden       Date: 11/28/2021 DOB: [birth year]  Age: 56 y.o. MRN#: [id number] Attending Physician: Armando Reichert, MD Primary Care Physician: Dina Body, MD Admit Date: 11/10/2021  Reason for Consultation/Follow-up: Establishing goals of care  Subjective: Patient is an obsese male lying in bed with trach/vent present.  No response to verbal stimuli and patient is off of sedation. No family present at bedside.  Respirations are even and unlabored  Summary of counseling/coordination of care: After reviewing the patient's chart and assessing the patient at bedside, patient is is unable to participate in [place] discussions. He remains minimally responsive off of sedation.   I attempted to speak with patient's NOK/son Devariae over the phone. No answer. HIPAA compliant voicemail left with PMT contact information given.  PMT will continue to follow the patient throughout his hospitalization.   Physical Exam Vitals reviewed.  Constitutional:      Appearance: He is ill-appearing.  HENT:     Head: Normocephalic.     Mouth/Throat:     Comments: trach Cardiovascular:     Rate and Rhythm: Normal rate and regular rhythm.  Pulmonary:     Effort: Pulmonary effort is normal.     Comments: vent Musculoskeletal:     Comments: Does not move any extremities to command  Skin:    General: Skin is dry.            Total Time 25 minutes  Greater than 50%  of this time was spent counseling and coordinating care related to the above assessment and plan.  Thank you for allowing the Palliative Medicine Team to assist in the care of this patient.  [place] Ilsa Iha, FNP-BC Palliative Medicine Team Team Phone # [phone]

## 2021-11-28 NOTE — Progress Notes (Addendum)
Hematology/Oncology Progress note [phone] [phone]     Patient Care Team: Connar Body, MD as PCP - General (Family Medicine) Tsosie Billing, MD as Consulting Physician (Infectious Diseases) Earlie Server, MD as Consulting Physician (Oncology)   Name of the patient: Jordan Hayden  [id number]  1966-01-02  Date of visit: 11/28/21   INTERVAL HISTORY-  S/p Trach and biopsy. On ventilator. IR was consult for feeding tube, however it was felt to be difficut due to not able to place NGT without endoscopic guidance.   Son Radonna Ricker is at bedside.     Allergies  Allergen Reactions   Hydrocodone-Acetaminophen Swelling    Mouth Swelling    Penicillins Swelling    Patient Active Problem List   Diagnosis Date Noted   Thrombocytopenia ([place]) 04/01/2017    Priority: High   Cirrhosis of liver without ascites ([place]) 02/17/2016    Priority: Medium    Acute respiratory failure with hypoxia ([place]) 11/27/2021   Hypercalcemia 11/26/2021   AKI (acute kidney injury) ([place]) 12/07/2021   Prolonged INR 11/22/2021   UGIB (upper gastrointestinal bleed) 11/19/2021   Neck mass 11/27/2021   Hemoptysis 12/02/2021   Hepatic encephalopathy ([place]) 08/30/2020   Sacroiliitis ([place]) 09/30/2018   Bacteremia due to Gram-negative bacteria 09/12/2018   Cellulitis 06/16/2018   Morbid obesity with BMI of 40.0-44.9, adult ([place]) 02/12/2018   Sepsis ([place]) 06/28/2017   Iron deficiency anemia 05/19/2017   Anasarca 02/29/2016   Dupuytren's contracture of right hand 02/17/2016   Hyperbilirubinemia 02/17/2016     Past Medical History:  Diagnosis Date   Alcohol abuse    Alcoholic cirrhosis ([place])    Alcoholism ([place])    Arthritis    Cirrhosis ([place])      Past Surgical History:  Procedure Laterality Date   DIRECT LARYNGOSCOPY  11/19/2021   Procedure: DIRECT LARYNGOSCOPY WITH BIOPSIES;  Surgeon: Carloyn Manner, MD;  Location: [facility] ORS;  Service: ENT;;    ESOPHAGOGASTRODUODENOSCOPY (EGD) WITH PROPOFOL N/A 07/01/2017   Procedure: ESOPHAGOGASTRODUODENOSCOPY (EGD) WITH PROPOFOL;  Surgeon: Lucilla Lame, MD;  Location: [facility] END[facility]COPY;  Service: Endoscopy;  Laterality: N/A;   TRACHEOSTOMY TUBE PLACEMENT N/A 11/17/2021   Procedure: AWAKE TRACHEOSTOMY;  Surgeon: Carloyn Manner, MD;  Location: [facility] ORS;  Service: ENT;  Laterality: N/A;    Social History   Socioeconomic History   Marital status: Single    Spouse name: Not on file   Number of children: 1   Years of education: Not on file   Highest education level: Not on file  Occupational History   Not on file  Tobacco Use   Smoking status: Never   Smokeless tobacco: Never  Vaping Use   Vaping Use: Never used  Substance and Sexual Activity   Alcohol use: No    Comment: quit 6 years   Drug use: No   Sexual activity: Not Currently    Partners: Female  Other Topics Concern   Not on file  Social History Narrative   Not on file   Social Determinants of Health   Financial Resource Strain: Not on file  Food Insecurity: Not on file  Transportation Needs: Not on file  Physical Activity: Not on file  Stress: Not on file  Social Connections: Not on file  Intimate Partner Violence: Not on file     Family History  Problem Relation Age of Onset   Hypertension Mother    Diabetes Father    Cervical cancer Sister    Cirrhosis Brother  Current Facility-Administered Medications:    albuterol (PROVENTIL) (2.5 MG/3ML) 0.083% nebulizer solution 2.5 mg, 2.5 mg, Inhalation, Q6H PRN, Cox, Amy N, DO   Chlorhexidine Gluconate Cloth 2 % PADS 6 each, 6 each, Topical, Daily, Cox, Amy N, DO, 6 each at 11/28/21 1124   dextrose 5 % solution, , Intravenous, Continuous, Teressa Lower, NP, Last Rate: 75 mL/hr at 11/28/21 1124, Rate Change at 11/28/21 1124   docusate (COLACE) 50 MG/5ML liquid 100 mg, 100 mg, Per Tube, BID, Rust-Chester, Huel Cote, NP, 100 mg at 11/27/21 2107   fentaNYL (SUBLIMAZE)  injection 50-100 mcg, 50-100 mcg, Intravenous, Q2H PRN, Rust-Chester, Huel Cote, NP, 50 mcg at 41/66/06 3016   folic acid injection 1 mg, 1 mg, Intravenous, Daily, Beers, Shanon Brow, RPH, 1 mg at 11/28/21 1130   heparin lock flush 100 unit/mL, 500 Units, Intracatheter, Daily PRN, Verlon Au, NP   heparin lock flush 100 unit/mL, 250 Units, Intracatheter, PRN, Verlon Au, NP   heparin lock flush 100 unit/mL, 500 Units, Intracatheter, Daily PRN, Verlon Au, NP   heparin lock flush 100 unit/mL, 250 Units, Intracatheter, PRN, Verlon Au, NP   insulin aspart (novoLOG) injection 0-15 Units, 0-15 Units, Subcutaneous, Q4H, Rust-Chester, Toribio Harbour L, NP, 2 Units at 11/28/21 1307   lactulose (CHRONULAC) enema 200 gm, 300 mL, Rectal, Daily, Dgayli, Khabib, MD, 300 mL at 11/28/21 1714   norepinephrine (LEVOPHED) 16 mg in 247m (0.064 mg/mL) premix infusion, 0-40 mcg/min, Intravenous, Titrated, Rust-Chester, BHuel Cote NP, Stopped at 11/25/21 1818   Oral care mouth rinse, 15 mL, Mouth Rinse, Q2H, Rust-Chester, Britton L, NP, 15 mL at 11/28/21 1714   Oral care mouth rinse, 15 mL, Mouth Rinse, PRN, Rust-Chester, BToribio HarbourL, NP   pantoprazole (PROTONIX) injection 40 mg, 40 mg, Intravenous, Q12H, Cox, Amy N, DO, 40 mg at 11/28/21 1126   phytonadione (VITAMIN K) 5 mg in dextrose 5 % 50 mL IVPB, 5 mg, Intravenous, Daily, Chawla, Harsh, MD, Stopped at 11/28/21 0957   polyethylene glycol (MIRALAX / GLYCOLAX) packet 17 g, 17 g, Per Tube, Daily, Rust-Chester, BToribio HarbourL, NP   sodium chloride flush (NS) 0.9 % injection 10 mL, 10 mL, Intracatheter, PRN, AVerlon Au NP   sodium chloride flush (NS) 0.9 % injection 10 mL, 10 mL, Intracatheter, PRN, AVerlon Au NP   sodium chloride flush (NS) 0.9 % injection 3 mL, 3 mL, Intracatheter, PRN, AVerlon Au NP   sodium chloride flush (NS) 0.9 % injection 3 mL, 3 mL, Intracatheter, PRN, AVerlon Au NP   thiamine (VITAMIN B1) 500 mg in normal saline  (50 mL) IVPB, 500 mg, Intravenous, TID, Stopped at 11/28/21 1554 **FOLLOWED BY** [START ON 11/30/2021] thiamine (VITAMIN B1) injection 100 mg, 100 mg, Intravenous, Q24H, BLorna Dibble RVa [facility]  Physical exam:  Vitals:   11/28/21 1532 11/28/21 1559 11/28/21 1600 11/28/21 1700  BP:  116/72 116/72 117/68  Pulse:  (!) 58 (!) 59 (!) 58  Resp:   16   Temp: 98 F (36.7 C)     TempSrc: Axillary     SpO2:  99% 99% 99%  Weight:      Height:       Physical Exam Neck:     Comments: S/p tach Cardiovascular:     Rate and Rhythm: Normal rate.     Pulses: Normal pulses.  Pulmonary:     Comments: On Ventilator  Abdominal:     General: There is no distension.  Lymphadenopathy:  Cervical: Cervical adenopathy present.  Skin:    General: Skin is warm.       Labs    Latest Ref Rng & Units 11/28/2021    3:14 AM 11/27/2021   12:58 PM 11/26/2021    9:15 PM  CBC  WBC 4.0 - 10.5 K/uL 3.8  3.2  3.8   Hemoglobin 13.0 - 17.0 g/dL 7.8  7.5  7.4   Hematocrit 39.0 - 52.0 % 23.6  22.9  22.7   Platelets 150 - 400 K/uL 49  39  49       Latest Ref Rng & Units 11/28/2021    3:14 AM 11/27/2021    4:45 AM 11/26/2021    4:17 AM  CMP  Glucose 70 - 99 mg/dL 136  152  145   BUN 6 - 20 mg/dL 30  37  42   Creatinine 0.61 - 1.24 mg/dL 0.73  1.04  1.24   Sodium 135 - 145 mmol/L 151  151  146   Potassium 3.5 - 5.1 mmol/L 3.2  3.4  3.5   Chloride 98 - 111 mmol/L 114  114  110   CO2 22 - 32 mmol/L 31  33  29   Calcium 8.9 - 10.3 mg/dL 10.5  11.3  11.8      RADIOGRAPHIC STUDIES: I have personally reviewed the radiological images as listed and agreed with the findings in the report. US Abdomen Limited RUQ (LIVER/GB)  Result Date: 11/25/2021 CLINICAL DATA:  Cirrhosis. EXAM: ULTRASOUND ABDOMEN LIMITED RIGHT UPPER QUADRANT COMPARISON:  Abdominal ultrasound March 01, 2021 FINDINGS: Gallbladder: Mild sludge in the gallbladder without wall thickening, pericholecystic fluid, or discrete shadowing  stone. A Murphy's sign cannot be assessed due to patient sedation. Common bile duct: Diameter: 3.9 mm Liver: Nodular contour. Apparent mass measuring 2.5 x 2.3 x 1.9 cm in the left hepatic lobe not seen on previous imaging. Portal vein is patent on color Doppler imaging with normal direction of blood flow towards the liver. Other: None. IMPRESSION: 1. Suspected left hepatic lobe mass measuring up to 2.5 cm. Recommend MRI of the abdomen with and without contrast for better evaluation of this region. 2. Cirrhotic liver. 3. Mild sludge in the gallbladder. Electronically Signed   By: Dorise Bullion III M.D.   On: 11/25/2021 11:32   CT Soft Tissue Neck W Contrast  Result Date: 11/09/2021 CLINICAL DATA:  Neck mass EXAM: CT NECK WITH CONTRAST TECHNIQUE: Multidetector CT imaging of the neck was performed using the standard protocol following the bolus administration of intravenous contrast. RADIATION DOSE REDUCTION: This exam was performed according to the departmental dose-optimization program which includes automated exposure control, adjustment of the mA and/or kV according to patient size and/or use of iterative reconstruction technique. CONTRAST:  28m OMNIPAQUE IOHEXOL 300 MG/ML  SOLN COMPARISON:  None Available. FINDINGS: Pharynx and larynx: Mucosal mass involving the right supraglottic airway extending into the airway and causing significant airway narrowing. Mass is irregular and extends down to the vocal cords and could involve the right vocal cord. Epicenter is above the vocal cord. Mass extends up to the right lateral oropharynx. Salivary glands: No inflammation, mass, or stone. Right submandibular gland surrounded by malignant lymph nodes. Thyroid: Negative Lymph nodes: Malignant adenopathy in neck bilaterally right greater than left. Large conglomeration of lymph nodes throughout the right neck, many of which have necrosis. Right submandibular necrotic nodes measuring 15 mm and 14 mm. Right level 2 lymph  nodes 29 mm and 20 mm. Right  level 3 lymph nodes 15 mm and 21 mm and 17 mm. Right level 4 lymph nodes 17 mm and 28 mm. Submental lymph node to the right of midline 17 mm. Small necrotic left level 2 lymph node 9 mm. Posterior lymph nodes on the left measuring 14 mm and 13 mm. Additional smaller lymph nodes on the left. Vascular: Right jugular vein is compressed by the large lymph node mass on the right. No thrombus. Left jugular vein narrowed but patent. Limited intracranial: Negative Visualized orbits: Negative Mastoids and visualized paranasal sinuses: Mucosal edema paranasal sinuses. No air-fluid level. Mastoid clear. Skeleton: No acute abnormality. Upper chest: 5 mm nodule in the superior segment of the right lower lobe. 3 mm nodule left upper lobe. Other: None IMPRESSION: 1. 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 Mass extends down to the vocal cords and could involve the right vocal cord. Mass extends up to the right lateral oropharynx. 2. Malignant adenopathy in the neck bilaterally right greater than left. Largest lymph node mass is in the right neck measuring 29 mm and 20 mm. 3. 5 mm nodule superior segment right lower lobe and 3 mm nodule left upper lobe. Metastatic disease is possible. Chest CT recommended. 4. Right jugular vein is compressed by the large lymph node mass on the right. No thrombus. Left jugular vein narrowed but patent. 5. These results were called by telephone at the time of interpretation on 11/30/2021 at 3:49 pm to provider [facility], who verbally acknowledged these results. Electronically Signed   By: Franchot Gallo M.D.   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 I had a lengthy discussion with patient's son at the bedside.  Overall patient's  prognosis is very poor. -PD-L1 CPS added to specimen, result is pending. Given patient's other medical comorbidities, he is a poor candidate for aggressive chemotherapy treatments.  Monotherapy with immunotherapy may be considered if PD-L1 CPS is more than 20.  However patient may not recover to acceptable condition to receive treatment. Palliative care services on board.  Recommend goals of care discussion/CODE STATUS  # VDRF due to large supraglottic mass with airway compromised, s/p tracheostomy and biopsy   # Coagulopathy and thrombocytopenia due to advance liver cirrhosis.  S/p platelet transfusion, cryoprecipitate, FFP.  s/p Trach procedure.  At risk of bleeding. Hb and platelet both dropped.  I recommend to transfuse platelet count to keep count above 50,000. cryoprecipitate and FFP only if patient develops acute bleeding or if he needs invasive procedure.   # Advanced liver cirrhosis, GI is on board.  US liver showed liver mass, primary liver lesion vs metastatic lesion. Check AFP-pending Liver biopsy is of high risk due to increased bleeding risk.  # Hypercalcemia, likely due to malignancy. S/p pamidronate x 1, Calcium trending down  Thank you for allowing me to participate in the care of this patient.   Earlie Server, MD, PhD Hematology Oncology 11/28/2021

## 2021-11-28 NOTE — Progress Notes (Addendum)
PHARMACY CONSULT NOTE  Pharmacy Consult for Electrolyte Monitoring and Replacement   Recent Labs: Potassium (mmol/L)  Date Value  11/28/2021 3.2 (L)   Magnesium (mg/dL)  Date Value  11/28/2021 2.7 (H)   Calcium (mg/dL)  Date Value  11/28/2021 10.5 (H)   Albumin (g/dL)  Date Value  11/25/2021 3.2 (L)   Phosphorus (mg/dL)  Date Value  11/28/2021 1.7 (L)   Sodium (mmol/L)  Date Value  11/28/2021 151 (H)   Assessment: 56 year old male with history of advanced alcohol associated liver cirrhosis, alcohol abuse currently in remission, thrombocytopenia, iron deficiency anemia, morbid obesity, who presents to the ED for chief concern of nausea and vomiting blood. Pharmacy is asked to follow and replace electrolytes while in CCU  Patient is currently intubated, sedated, and on mechanical ventilation in the ICU. Patient is NPO.    Goal of Therapy:  Electrolytes within normal limits  Plan: AKI resolving Scr improved with UOP. Will CTM fluid balance & f/u with intermittent lasix doses. Scr 1.45>1.24>1.04>0.73; I/O +9.5L; UOP 0.7>0.48m/k/h  Na 151>151 - Lasix '40mg'$  IV x1 on 11/19. (Defer management to primary team ) increased D5W '@50'$ >725mh  K 3.4>3.2: Despite 2233mK+ from IV Kphos solution on 11/20. Likely heightened requirement initially d/t significant renal recovery. Repleting with KCL IV 59m23m1h x2 & 44me33mom IV Kphos  Ca 11.3>10.5 (down-trending), pamidronate 60 mg x 1 on 11/17. Zometa '4mg'$  IV x1 on 11/19. No further doses at this time.  Phos 2.2>1.7: Despite Kphos IV 15mmo20m on 11/20  Repleting with Kphos IV 30mmol57m(per CCU NP)  Mg 2.9>2.7:  mild elevation but slight improvement. No change at this time  --Follow-up electrolytes with AM labs tomorrow  BrandonLorna Dibble2023 8:12 AM

## 2021-11-28 NOTE — Progress Notes (Signed)
..11/28/2021 1:06 PM  Annett Gula [id number]  Post-Op Day 4    Temp:  [97 F (36.1 C)-98.6 F (37 C)] 98.1 F (36.7 C) (11/21 1134) Pulse Rate:  [56-71] 64 (11/21 1200) Resp:  [13-21] 16 (11/21 1200) BP: (107-126)/(57-71) 111/71 (11/21 1200) SpO2:  [94 %-100 %] 100 % (11/21 1217) Arterial Line BP: (109-141)/(48-72) 111/53 (11/21 1217) FiO2 (%):  [28 %] 28 % (11/21 1217) Weight:  [128.1 kg] 128.1 kg (11/21 0500),     Intake/Output Summary (Last 24 hours) at 11/28/2021 1306 Last data filed at 11/28/2021 1251 Gross per 24 hour  Intake 3322.12 ml  Output 1895 ml  Net 1427.12 ml    Results for orders placed or performed during the hospital encounter of 11/29/2021 (from the past 24 hour(s))  Glucose, capillary     Status: Abnormal   Collection Time: 11/27/21  3:22 PM  Result Value Ref Range   Glucose-Capillary 156 (H) 70 - 99 mg/dL  D-dimer, quantitative     Status: Abnormal   Collection Time: 11/27/21  4:02 PM  Result Value Ref Range   D-Dimer, Quant 15.82 (H) 0.00 - 0.50 ug/mL-FEU  Fibrinogen     Status: Abnormal   Collection Time: 11/27/21  4:02 PM  Result Value Ref Range   Fibrinogen 142 (L) 210 - 475 mg/dL  Prepare cryoprecipitate     Status: None   Collection Time: 11/27/21  5:47 PM  Result Value Ref Range   Unit Number [id number]    Blood Component Type CRYPOOL THAW    Unit division 00    Status of Unit ISSUED,FINAL    Transfusion Status OK TO TRANSFUSE    Unit Number [id number]    Blood Component Type CRYPOOL THAW    Unit division 00    Status of Unit ISSUED,FINAL    Transfusion Status      OK TO TRANSFUSE Performed at [facility], [address] [place], [place] [address]   Glucose, capillary     Status: Abnormal   Collection Time: 11/27/21  7:34 PM  Result Value Ref Range   Glucose-Capillary 134 (H) 70 - 99 mg/dL  Prepare platelet pheresis     Status: None   Collection Time: 11/27/21 10:00 PM  Result Value Ref Range   Unit  Number [id number]    Blood Component Type PLTP2 PSORALEN TREATED    Unit division 00    Status of Unit ISSUED,FINAL    Transfusion Status      OK TO TRANSFUSE Performed at [facility], [address] [place], [place] [address]   Glucose, capillary     Status: Abnormal   Collection Time: 11/27/21 11:00 PM  Result Value Ref Range   Glucose-Capillary 131 (H) 70 - 99 mg/dL  APTT     Status: Abnormal   Collection Time: 11/28/21  3:14 AM  Result Value Ref Range   aPTT 39 (H) 24 - 36 seconds  Protime-INR     Status: Abnormal   Collection Time: 11/28/21  3:14 AM  Result Value Ref Range   Prothrombin Time 20.6 (H) 11.4 - 15.2 seconds   INR 1.8 (H) 0.8 - 1.2  Fibrinogen     Status: Abnormal   Collection Time: 11/28/21  3:14 AM  Result Value Ref Range   Fibrinogen 192 (L) 210 - 475 mg/dL  Basic metabolic panel     Status: Abnormal   Collection Time: 11/28/21  3:14 AM  Result Value Ref Range   Sodium 151 (H)  135 - 145 mmol/L   Potassium 3.2 (L) 3.5 - 5.1 mmol/L   Chloride 114 (H) 98 - 111 mmol/L   CO2 31 22 - 32 mmol/L   Glucose, Bld 136 (H) 70 - 99 mg/dL   BUN 30 (H) 6 - 20 mg/dL   Creatinine, Ser 0.73 0.61 - 1.24 mg/dL   Calcium 10.5 (H) 8.9 - 10.3 mg/dL   GFR, Estimated >60 >60 mL/min   Anion gap 6 5 - 15  Phosphorus     Status: Abnormal   Collection Time: 11/28/21  3:14 AM  Result Value Ref Range   Phosphorus 1.7 (L) 2.5 - 4.6 mg/dL  Magnesium     Status: Abnormal   Collection Time: 11/28/21  3:14 AM  Result Value Ref Range   Magnesium 2.7 (H) 1.7 - 2.4 mg/dL  CBC with Differential/Platelet     Status: Abnormal   Collection Time: 11/28/21  3:14 AM  Result Value Ref Range   WBC 3.8 (L) 4.0 - 10.5 K/uL   RBC 2.25 (L) 4.22 - 5.81 MIL/uL   Hemoglobin 7.8 (L) 13.0 - 17.0 g/dL   HCT 23.6 (L) 39.0 - 52.0 %   MCV 104.9 (H) 80.0 - 100.0 fL   MCH 34.7 (H) 26.0 - 34.0 pg   MCHC 33.1 30.0 - 36.0 g/dL   RDW 17.2 (H) 11.5 - 15.5 %   Platelets 49 (L) 150 - 400 K/uL    nRBC 3.4 (H) 0.0 - 0.2 %   Neutrophils Relative % 75 %   Neutro Abs 2.9 1.7 - 7.7 K/uL   Lymphocytes Relative 15 %   Lymphs Abs 0.6 (L) 0.7 - 4.0 K/uL   Monocytes Relative 8 %   Monocytes Absolute 0.3 0.1 - 1.0 K/uL   Eosinophils Relative 1 %   Eosinophils Absolute 0.0 0.0 - 0.5 K/uL   Basophils Relative 0 %   Basophils Absolute 0.0 0.0 - 0.1 K/uL   RBC Morphology MORPHOLOGY UNREMARKABLE    Smear Review Normal platelet morphology    Immature Granulocytes 1 %   Abs Immature Granulocytes 0.04 0.00 - 0.07 K/uL  Type and screen [facility]     Status: None   Collection Time: 11/28/21  3:14 AM  Result Value Ref Range   ABO/RH(D) B POS    Antibody Screen NEG    Sample Expiration      12/01/2021,2359 Performed at [facility], [address], [place], [place] [address]   Pathologist smear review     Status: None   Collection Time: 11/28/21  3:14 AM  Result Value Ref Range   Path Review  Blood Smear is reviewed   Glucose, capillary     Status: Abnormal   Collection Time: 11/28/21  3:17 AM  Result Value Ref Range   Glucose-Capillary 128 (H) 70 - 99 mg/dL  Glucose, capillary     Status: Abnormal   Collection Time: 11/28/21  7:51 AM  Result Value Ref Range   Glucose-Capillary 129 (H) 70 - 99 mg/dL   Comment 1 Notify RN   Glucose, capillary     Status: Abnormal   Collection Time: 11/28/21 12:40 PM  Result Value Ref Range   Glucose-Capillary 125 (H) 70 - 99 mg/dL    SUBJECTIVE:  No acute events.  Minimal response even off of sedation  OBJECTIVE:  GEN- obtunded, minimal eye opening NECK-  trach secure with dried blood.  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  Unresectable.  Possible metastatic disease on CT in lungs  PLAN:  Cautious trach care and manipulation as high risk of rebleeding.  No packing in wound at this time except for  surgicel that will absorb.  Ok to remove stiches after 7 days.  Please reconsult if needed.  Will plan on presenting patient to tumor board next week for discussion of treatment options and workup.  Anny Sayler 11/28/2021, 1:06 PM

## 2021-11-29 DIAGNOSIS — Z515 Encounter for palliative care: Secondary | ICD-10-CM | POA: Diagnosis not present

## 2021-11-29 DIAGNOSIS — R221 Localized swelling, mass and lump, neck: Secondary | ICD-10-CM | POA: Diagnosis not present

## 2021-11-29 DIAGNOSIS — D696 Thrombocytopenia, unspecified: Secondary | ICD-10-CM | POA: Diagnosis not present

## 2021-11-29 DIAGNOSIS — K7460 Unspecified cirrhosis of liver: Secondary | ICD-10-CM | POA: Diagnosis not present

## 2021-11-29 DIAGNOSIS — J9601 Acute respiratory failure with hypoxia: Secondary | ICD-10-CM | POA: Diagnosis not present

## 2021-11-29 DIAGNOSIS — C321 Malignant neoplasm of supraglottis: Secondary | ICD-10-CM | POA: Diagnosis not present

## 2021-11-29 DIAGNOSIS — K7030 Alcoholic cirrhosis of liver without ascites: Secondary | ICD-10-CM | POA: Diagnosis not present

## 2021-11-29 LAB — BASIC METABOLIC PANEL
Anion gap: 3 — ABNORMAL LOW (ref 5–15)
BUN: 23 mg/dL — ABNORMAL HIGH (ref 6–20)
CO2: 30 mmol/L (ref 22–32)
Calcium: 10 mg/dL (ref 8.9–10.3)
Chloride: 111 mmol/L (ref 98–111)
Creatinine, Ser: 0.58 mg/dL — ABNORMAL LOW (ref 0.61–1.24)
GFR, Estimated: >60 mL/min (ref >60)
Glucose, Bld: 119 mg/dL — ABNORMAL HIGH (ref 70–99)
Potassium: 3.4 mmol/L — ABNORMAL LOW (ref 3.5–5.1)
Sodium: 144 mmol/L (ref 135–145)

## 2021-11-29 LAB — FIBRINOGEN: Fibrinogen: 158 mg/dL — ABNORMAL LOW (ref 210–475)

## 2021-11-29 LAB — PHOSPHORUS
Phosphorus: 1.3 mg/dL — ABNORMAL LOW (ref 2.5–4.6)
Phosphorus: 2.3 mg/dL — ABNORMAL LOW (ref 2.5–4.6)

## 2021-11-29 LAB — GLUCOSE, CAPILLARY
Glucose-Capillary: 102 mg/dL — ABNORMAL HIGH (ref 70–99)
Glucose-Capillary: 104 mg/dL — ABNORMAL HIGH (ref 70–99)
Glucose-Capillary: 105 mg/dL — ABNORMAL HIGH (ref 70–99)
Glucose-Capillary: 75 mg/dL (ref 70–99)
Glucose-Capillary: 84 mg/dL (ref 70–99)
Glucose-Capillary: 92 mg/dL (ref 70–99)

## 2021-11-29 LAB — APTT: aPTT: 38 seconds — ABNORMAL HIGH (ref 24–36)

## 2021-11-29 LAB — CBC WITH DIFFERENTIAL/PLATELET
Abs Immature Granulocytes: 0 10*3/uL (ref 0.00–0.07)
Basophils Absolute: 0 10*3/uL (ref 0.0–0.1)
Basophils Relative: 0 %
Eosinophils Absolute: 0.1 10*3/uL (ref 0.0–0.5)
Eosinophils Relative: 2 %
HCT: 25.9 % — ABNORMAL LOW (ref 39.0–52.0)
Hemoglobin: 8.5 g/dL — ABNORMAL LOW (ref 13.0–17.0)
Lymphocytes Relative: 15 %
Lymphs Abs: 0.7 10*3/uL (ref 0.7–4.0)
MCH: 34.1 pg — ABNORMAL HIGH (ref 26.0–34.0)
MCHC: 32.8 g/dL (ref 30.0–36.0)
MCV: 104 fL — ABNORMAL HIGH (ref 80.0–100.0)
Metamyelocytes Relative: 1 %
Monocytes Absolute: 0.3 10*3/uL (ref 0.1–1.0)
Monocytes Relative: 6 %
Neutro Abs: 3.6 10*3/uL (ref 1.7–7.7)
Neutrophils Relative %: 76 %
Platelets: 38 10*3/uL — ABNORMAL LOW (ref 150–400)
RBC: 2.49 MIL/uL — ABNORMAL LOW (ref 4.22–5.81)
RDW: 17.2 % — ABNORMAL HIGH (ref 11.5–15.5)
Smear Review: NORMAL
WBC: 4.7 10*3/uL (ref 4.0–10.5)
nRBC: 13 /100 WBC — ABNORMAL HIGH
nRBC: 6.4 % — ABNORMAL HIGH (ref 0.0–0.2)

## 2021-11-29 LAB — AFP TUMOR MARKER: AFP, Serum, Tumor Marker: 2.1 ng/mL (ref 0.0–8.4)

## 2021-11-29 LAB — PROTIME-INR
INR: 1.8 — ABNORMAL HIGH (ref 0.8–1.2)
Prothrombin Time: 20.6 seconds — ABNORMAL HIGH (ref 11.4–15.2)

## 2021-11-29 LAB — MISC LABCORP TEST (SEND OUT): Labcorp test code: 791584

## 2021-11-29 LAB — MAGNESIUM: Magnesium: 2.4 mg/dL (ref 1.7–2.4)

## 2021-11-29 MED ORDER — ORAL CARE MOUTH RINSE: 15.0000 mL | OROMUCOSAL | Status: DC

## 2021-11-29 MED ORDER — SODIUM CHLORIDE 0.9% IV SOLUTION: Freq: Once | INTRAVENOUS | Status: AC

## 2021-11-29 MED ORDER — POTASSIUM PHOSPHATES 15 MMOLE/5ML IV SOLN
45.0000 mmol | Freq: Once | INTRAVENOUS | Status: AC
  Administered 2021-11-29: 45 mmol via INTRAVENOUS
  Filled 2021-11-29: qty 15

## 2021-11-29 MED ORDER — ORAL CARE MOUTH RINSE: 15.0000 mL | OROMUCOSAL | Status: DC | PRN

## 2021-11-29 NOTE — Progress Notes (Signed)
PHARMACY CONSULT NOTE  Pharmacy Consult for Electrolyte Monitoring and Replacement   Recent Labs: Potassium (mmol/L)  Date Value  11/29/2021 3.4 (L)   Magnesium (mg/dL)  Date Value  11/29/2021 2.4   Calcium (mg/dL)  Date Value  11/29/2021 10.0   Albumin (g/dL)  Date Value  11/25/2021 3.2 (L)   Phosphorus (mg/dL)  Date Value  11/29/2021 1.3 (L)   Sodium (mmol/L)  Date Value  11/29/2021 144   Assessment: 56 year old male with history of advanced alcohol associated liver cirrhosis, alcohol abuse currently in remission, thrombocytopenia, iron deficiency anemia, morbid obesity, who presents to the ED for chief concern of nausea and vomiting blood. Pharmacy is asked to follow and replace electrolytes while in CCU  Patient is currently intubated, sedated, and on mechanical ventilation in the ICU. Patient is NPO.    Goal of Therapy:  Electrolytes within normal limits  Plan: AKI resolving Scr improved with UOP now WNL/slowing. Will CTM fluid balance & f/u with intermittent lasix doses. Scr 1.45>1.24>1.04>0.73>0.58; I/O +10.2L; UOP 0.7>0.6>0.42m/k/h  Na 151>144 - Lasix '40mg'$  IV x1 on 11/19. (Defer management to primary team ) on D5W '@75ml'$ /h >> stopped  K 3.2>3.4: Despite KCL IV 149m q1h x2 & 4463mfrom IV Kphos Will receive 32m71mrom IV KPhos  Ca 10.5>10 (ULN), pamidronate 60 mg x 1 on 11/17. Zometa '4mg'$  IV x1 on 11/19. No further doses at this time.  Phos 1.7>1.3: Despite Kphos IV 30mm43m1  on 11/21  Repleting with Kphos IV 45 mmol x1   Mg 2.7>2.4 (ULN):  No change at this time  --Follow-up electrolytes with AM labs tomorrow  BrandLorna Dibble2/2023 8:53 AM

## 2021-11-29 NOTE — Progress Notes (Signed)
Patient able to follow some simple commands and tired to move head up and down and left to right.  Patient did stated by nodding that he wasn't in pain or hurting at this time.  Patient becoming more arousable since being off of sedation.

## 2021-11-29 NOTE — Progress Notes (Signed)
NAME:  Jordan Hayden, MRN:  [id number], DOB:  [birth year], LOS: 6 ADMISSION DATE:  11/11/2021, CHIEF COMPLAINT:  respiratory failure   History of Present Illness:  56 year old male non-smoker with history of morbid obesity, and decompensated alcoholic cirrhosis (esophageal varices and hepatic encephalopathy) who presented to the hospital after an episode of hematemesis and worsening dysphagia.   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 There was also concern for malignant adenopathy in the neck with compression of the right internal jugular vein. He was evaluated by ENT and underwent urgent tracheostomy on 11/30/2021 for impending airway closure. GI was also consulted for concern for hematemesis in the setting of advanced cirrhosis. He was initially on PPI & Octreotide gtt, transitioned to PPI only.  Following tracheostomy, he's continued to have bleeding around the tracheostomy site and has received platelets, Vitamin K, FFP's and cryoprecipitate. ENT continues to follow his tracheostomy site for bleeding. He continues to be coagulopathic secondary to his liver disease. 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   Significant Hospital Events: Including procedures, antibiotic start and stop dates in addition to other pertinent events   11/22/2021: Admitted 11/09/2021: Tracheostomy placed by ENT. Aline and CVC placed 11/27/2021: Pt received 3 pools of cryoprecipitate due to fibrinogen level of 135  11/28/2021: Pts mentation slowly improving able to open eyes to voice; attempted SBT however pt failed placed back on full support FiO2 28%/PEEP 5   Interim History / Subjective:  Off sedation and on rectal lactulose, more awake this morning. Goals of care  conversation with family yesterday.  Objective   Blood pressure 111/68, pulse (!) 59, temperature 98.2 F (36.8 C), temperature source Oral, resp. rate 16, height '5\' 8"'$  (1.727 m), weight 128.1 kg, SpO2 99 %.    Vent Mode: PRVC FiO2 (%):  [28 %] 28 % Set Rate:  [16 bmp] 16 bmp Vt Set:  [500 mL] 500 mL PEEP:  [5 cmH20] 5 cmH20 Pressure Support:  [10 cmH20] 10 cmH20 Plateau Pressure:  [15 cmH20-17 cmH20] 16 cmH20   Intake/Output Summary (Last 24 hours) at 11/29/2021 0802 Last data filed at 11/29/2021 0700 Gross per 24 hour  Intake 2246.81 ml  Output 1575 ml  Net 671.81 ml   Filed Weights   11/27/21 0400 11/28/21 0500 11/29/21 0500  Weight: 128.1 kg 128.1 kg 128.1 kg    Examination: Physical Exam Constitutional:      General: He is not in acute distress.    Appearance: He is obese. He is ill-appearing.  Neck:     Comments: Tracheostomy tube in place Cardiovascular:     Rate and Rhythm: Normal rate and regular rhythm.     Heart sounds: Normal heart sounds.  Pulmonary:     Breath sounds: Normal breath sounds.     Comments: Ventilated breath sounds Abdominal:     General: There is distension.  Neurological:     Comments: More awake today. Opens eyes to commands, follows commands.     Assessment & Plan:   56 year old male with history of decompensated liver cirrhosis presents with hemoptysis/hematemesis and dysphagia, found to have a large supraglotic neck mass compromising the airway and requiring emergent tracheostomy tube placement.   Neurology #Toxic Metabolic Encephalopathy #Sedation   Encephalopathy in  the setting of sedation and hepatic encephalopathy. Rectal lactulose initiated for management of hepatic encephalopathy and sedation has been held, with improvement in mental status. Goal RASS of 0   -goal RASS 0 -continue daily lactulose retention enema   Respiratory #Acute Hypoxic Respiratory Failure #Supraglottic tumor   S/p tracheostomy in the setting of  a supraglottic mass with impending respiratory failure. On Minimal ventilator settings with barrier to liberation is mental status. Will attempt to wean sedation and attempt PSV followed by trach collar once tolerates.  -daily SBT -trach collar once tolerates   Cardiovascular #Hemorrhagic Shock - resolved   Hemodynamically stable with MAP's in the 70's and not requiring any vasopressor support. Continue to monitor.   GI #Decompensated Liver Cirrhosis #Esophageal Varices   MELD-Na 19 and Child's Pugh Class C. On rectal lactulose for the management of hepatic encephalopathy. Does have a liver lesion that is concerning for metastatic malignancy vs HCC, albeit high risk for biopsy given his coagulopathy at this moment. Would not be a liver transplant candidate given neck mass malignant. Unable to place NG or OG tube given supraglottic tumor. IR PEG not possible, would have to be surgical. This is all contingent on goals of care discussions.  -GI consult highly appreciated. -rectal lactulose   Renal #AKI - resolving #Hypernatremia  Kidney function has improved, will continue to avoid nephrotoxic medications and replete electrolytes (K and Phos). Patient is hypernatremic and will benefit from the initiation of a D5 infusion with a goal sodium of 145.   Endo #Type 2 Diabetes   On Insulin sliding scale. 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 Has received blood products and cryoprecipitate with improvement in fibrinogen. Will monitor markers of DIC (fibrinogen, D-dimer, PT/PTT - FDP's not available in our lab) and replete as indicated. Neck mass biopsy shows advanced poorly differentiated squamous cell carcinoma. Unfortunately, given functional status and cirrhosis, patient is a poor candidate for  therapy. Goals of care conversations initiated with family and they are aware of the gravity of his situation. Highly appreciate hematology consult.   Infectious Diseases   Received Ceftriaxone for SBP prophylaxis. discontinued.  Best Practice (right click and "Reselect all SmartList Selections" daily)   Diet/type: NPO DVT prophylaxis: not indicated GI prophylaxis: PPI Lines: N/A Foley:  N/A Code Status:  full code Last date of multidisciplinary goals of care discussion [11/29/2021]  Labs   CBC: Recent Labs  Lab 11/26/21 2115 11/27/21 1258 11/28/21 0314 11/28/21 2208 11/29/21 0552  WBC 3.8* 3.2* 3.8* 4.7 4.7  NEUTROABS 3.1 2.5 2.9 3.5 PENDING  HGB 7.4* 7.5* 7.8* 8.3* 8.5*  HCT 22.7* 22.9* 23.6* 25.5* 25.9*  MCV 102.3* 102.2* 104.9* 104.5* 104.0*  PLT 49* 39* 49* 43* 38*    Basic Metabolic Panel: Recent Labs  Lab 11/25/21 0457 11/25/21 1641 11/26/21 0417 11/27/21 0445 11/28/21 0314 11/29/21 0552  NA 146* 146* 146* 151* 151* 144  K 3.9 3.8 3.5 3.4* 3.2* 3.4*  CL 109 109 110 114* 114* 111  CO2 '29 25 29 '$ 33* 31 30  GLUCOSE 150* 171* 145* 152* 136* 119*  BUN 43* 43* 42* 37* 30* 23*  CREATININE 1.71* 1.45* 1.24 1.04 0.73 0.58*  CALCIUM 12.1* 12.5* 11.8* 11.3* 10.5* 10.0  MG 2.6*  --  2.8* 2.9* 2.7* 2.4  PHOS 2.9  --  2.4* 2.2* 1.7* 1.3*   GFR:  Estimated Creatinine Clearance: 137.8 mL/min (A) (by C-G formula based on SCr of 0.58 mg/dL (L)). Recent Labs  Lab 11/27/21 1258 11/28/21 0314 11/28/21 2208 11/29/21 0552  WBC 3.2* 3.8* 4.7 4.7    Liver Function Tests: Recent Labs  Lab 11/19/2021 1422 11/20/2021 0311 11/25/21 0457  AST 68* 80* 74*  ALT 30 34 29  ALKPHOS 89 104 73  BILITOT 4.4* 4.9* 4.9*  PROT 6.1* 6.8 6.0*  ALBUMIN 2.1* 2.3* 3.2*   No results for input(s): "LIPASE", "AMYLASE" in the last 168 hours. Recent Labs  Lab 11/29/2021 1422 11/20/2021 2028 11/25/21 0457  AMMONIA 48* 33 32    ABG    Component Value Date/Time   PHART 7.45 11/19/2021  1549   PCO2ART 43 11/12/2021 1549   PO2ART 101 11/11/2021 1549   HCO3 29.9 (H) 11/10/2021 1549   O2SAT 99.2 11/19/2021 1549     Coagulation Profile: Recent Labs  Lab 11/25/21 0457 11/26/21 0417 11/27/21 0445 11/28/21 0314 11/29/21 0552  INR 1.9* 2.2* 1.9* 1.8* 1.8*    Cardiac Enzymes: No results for input(s): "CKTOTAL", "CKMB", "CKMBINDEX", "TROPONINI" in the last 168 hours.  HbA1C: Hgb A1c MFr Bld  Date/Time Value Ref Range Status  11/27/2021 07:59 PM 4.4 (L) 4.8 - 5.6 % Final    Comment:    (NOTE)         Prediabetes: 5.7 - 6.4         Diabetes: >6.4         Glycemic control for adults with diabetes: <7.0     CBG: Recent Labs  Lab 11/28/21 1240 11/28/21 1539 11/28/21 1933 11/28/21 2309 11/29/21 0553  GLUCAP 125* 116* 120* 119* 105*    Review of Systems:   Unable to obtain  Past Medical History:  He,  has a past medical history of Alcohol abuse, Alcoholic cirrhosis ([place]), Alcoholism ([place]), Arthritis, and Cirrhosis ([place]).   Surgical History:   Past Surgical History:  Procedure Laterality Date   DIRECT LARYNGOSCOPY  11/25/2021   Procedure: DIRECT LARYNGOSCOPY WITH BIOPSIES;  Surgeon: Carloyn Manner, MD;  Location: [facility] ORS;  Service: ENT;;   ESOPHAGOGASTRODUODENOSCOPY (EGD) WITH PROPOFOL N/A 07/01/2017   Procedure: ESOPHAGOGASTRODUODENOSCOPY (EGD) WITH PROPOFOL;  Surgeon: Lucilla Lame, MD;  Location: [facility] ENDOSCOPY;  Service: Endoscopy;  Laterality: N/A;   TRACHEOSTOMY TUBE PLACEMENT N/A 12/05/2021   Procedure: AWAKE TRACHEOSTOMY;  Surgeon: Carloyn Manner, MD;  Location: [facility] ORS;  Service: ENT;  Laterality: N/A;     Social History:   reports that he has never smoked. He has never used smokeless tobacco. He reports that he does not drink alcohol and does not use drugs.   Family History:  His family history includes Cervical cancer in his sister; Cirrhosis in his brother; Diabetes in his father; Hypertension in his mother.   Allergies Allergies   Allergen Reactions   Hydrocodone-Acetaminophen Swelling    Mouth Swelling    Penicillins Swelling     Home Medications  Prior to Admission medications   Medication Sig Start Date End Date Taking? Authorizing Provider  chlorhexidine (PERIDEX) 0.12 % solution Use as directed 5 mLs in the mouth or throat 2 (two) times daily. 11/16/21  Yes [provider]  albuterol (VENTOLIN HFA) 108 (90 Base) MCG/ACT inhaler Inhale into the lungs. Patient not taking: Reported on 11/26/2021 02/02/21 02/02/22  [provider]  aspirin EC 81 MG tablet Take 81 mg by mouth daily. Patient not taking: Reported on 07/25/2021 03/05/16   [provider]  doxycycline (VIBRAMYCIN)  100 MG capsule Take 100 mg by mouth 2 (two) times daily. Patient not taking: Reported on 11/12/2021 07/19/21   [provider]  ferrous sulfate 325 (65 FE) MG tablet Take 325 mg by mouth every morning. 06/05/20   [provider]  furosemide (LASIX) 20 MG tablet Take 1 tablet (20 mg total) by mouth daily. 07/02/17 08/31/20  Salary, Holly Bodily D, MD  hydrOXYzine (ATARAX/VISTARIL) 25 MG tablet Take 25 mg by mouth 3 (three) times daily as needed. 04/01/17   [provider]  lactulose (CHRONULAC) 10 GM/15ML solution Take 30 mLs (20 g total) by mouth 3 (three) times daily. 08/31/20   Ezekiel Slocumb, DO  meloxicam (MOBIC) 15 MG tablet Take 1 tablet (15 mg total) by mouth daily. 07/22/18   Cuthriell, Charline Bills, PA-C  pantoprazole (PROTONIX) 40 MG tablet Take 1 tablet (40 mg total) by mouth daily. 07/02/17 08/31/20  Salary, Avel Peace, MD  potassium chloride SA (K-DUR,KLOR-CON) 20 MEQ tablet Take 2 tablets (40 mEq total) by mouth 2 (two) times daily. 07/02/17   Salary, Avel Peace, MD  spironolactone (ALDACTONE) 25 MG tablet Take 0.5 tablets (12.5 mg total) by mouth 2 (two) times daily. 07/02/17   Salary, Avel Peace, MD  traMADol (ULTRAM) 50 MG tablet Take 50 mg by mouth every 6 (six) hours as needed for moderate  pain. Patient not taking: Reported on 12/02/2021 11/16/21   [provider]  XIFAXAN 550 MG TABS tablet Take 550 mg by mouth 2 (two) times daily. 08/08/20   [provider]     Critical care time: 55 minutes

## 2021-11-29 NOTE — Progress Notes (Signed)
                                                     Palliative Care Progress Note, Assessment & Plan   Patient Name: Jordan Hayden       Date: 11/29/2021 DOB: [birth year]  Age: 56 y.o. MRN#: [id number] Attending Physician: Armando Reichert, MD Primary Care Physician: Sevag Body, MD Admit Date: 11/10/2021  Reason for Consultation/Follow-up: Establishing goals of care  Subjective: Patient is lying in bed with full supportive ventilator on trach.  Patient does not acknowledge my presence and is not able to make his wishes known.  No family/friends present at bedside.  HPI: 56 y.o. male  with past medical history of HTN, OSA, morbid obesity, VitD deficiency, GERD, and advanced alcoholic cirrhosis of the liver admitted on 11/28/2021 with c/o coughing/spitting up blood.   CT revealed right sided supraglottic neck mass with narrowing of airway concerning for malignancy. Trach placed 11/17. Biopsy of mass still pending.   Summary of counseling/coordination of care: After reviewing the patient's chart and assessing the patient at bedside, I counseled with RN Lysbeth Galas. CMM Dr. Genia Harold has spoken with son Devariae about gravity of patient's condition. As of yesterday, family wanted to continue current plan of care.   Given no family at bedside and patient's son has not returned PMT phone call, I asked that Lysbeth Galas advise me if family presents at bedside today.  I again attempted to speak with patient's son/surrogate decision maker over the phone. No answer. 2nd HIPAA appropriate voicemail left.   I left a copy of [facility] [employer] [employer] at bedside in case family/friends come to bedside when PMT is not available.   PMT will continue to follow the patient and remain available to family for support and [place] discussions.   Physical Exam Vitals reviewed.   Constitutional:      Appearance: He is obese. He is ill-appearing.  HENT:     Head: Normocephalic.     Mouth/Throat:     Comments: trach Cardiovascular:     Rate and Rhythm: Normal rate.     Pulses: Normal pulses.  Pulmonary:     Comments: Full vent support Skin:    General: Skin is warm and dry.             Palliative Assessment/Data: 30%    Total Time 25 minutes  Greater than 50%  of this time was spent counseling and coordinating care related to the above assessment and plan.  Thank you for allowing the Palliative Medicine Team to assist in the care of this patient.  Lynn Ilsa Iha, FNP-BC Palliative Medicine Team Team Phone # [phone]

## 2021-11-29 NOTE — TOC Initial Note (Signed)
Transition of Care [facility]) - Initial/Assessment Note    Patient Details  Name: Jordan Hayden MRN: [id number] Date of Birth: November 13, 1965  Transition of Care [facility]) CM/SW Contact:    Shelbie Hutching, RN Phone Number: 11/29/2021, 12:22 PM  Clinical Narrative:                 Patient admitted to the hospital with a neck mass that was impairing the patient's ability to eat and drink.  Awake tracheostomy procedure on 11/17.  Patient is currently in the ICU on the ventilator.  Palliative care has been consulted for goals of care discussion.   Patient is from home with is girlfriend.    TOC will follow for needs.   Expected Discharge Plan:  (TBD) Barriers to Discharge: Continued Medical Work up   Patient Goals and CMS Choice Patient states their goals for this hospitalization and ongoing recovery are:: patient is unable to state goals, trach on vent, sedated      Expected Discharge Plan and Services Expected Discharge Plan:  (TBD)   Discharge Planning Services: CM Consult   Living arrangements for the past 2 months: Single Family Home                                      Prior Living Arrangements/Services Living arrangements for the past 2 months: Single Family Home Lives with:: Significant Other Patient language and need for interpreter reviewed:: Yes        Need for Family Participation in Patient Care: Yes (Comment) Care giver support system in place?: Yes (comment)   Criminal Activity/Legal Involvement Pertinent to Current Situation/Hospitalization: No - Comment as needed  Activities of Daily Living Home Assistive Devices/Equipment: Other (Comment) (UTA) ADL Screening (condition at time of admission) Patient's cognitive ability adequate to safely complete daily activities?: Yes Is the patient deaf or have difficulty hearing?: No Does the patient have difficulty seeing, even when wearing glasses/contacts?: No Does the patient have difficulty concentrating,  remembering, or making decisions?: No Patient able to express need for assistance with ADLs?: Yes Does the patient have difficulty dressing or bathing?: No Independently performs ADLs?: Yes (appropriate for developmental age) Does the patient have difficulty walking or climbing stairs?: No Weakness of Legs: None Weakness of Arms/Hands: None  Permission Sought/Granted      Share Information with NAME: Gabriel Rung     Permission granted to share info w Relationship: son  Permission granted to share info w Contact Information: [phone]  Emotional Assessment Appearance:: Appears stated age Attitude/Demeanor/Rapport: Intubated (Following Commands or Not Following Commands) Affect (typically observed): Unable to Assess   Alcohol / Substance Use: Not Applicable Psych Involvement: No (comment)  Admission diagnosis:  Neck mass [R22.1] Neck abscess [L02.11] AKI (acute kidney injury) ([place]) [N17.9] UGIB (upper gastrointestinal bleed) [K92.2] Cirrhosis of liver without ascites, unspecified hepatic cirrhosis type ([place]) [K74.60] Patient Active Problem List   Diagnosis Date Noted   Coagulopathy ([place]) 11/28/2021   Goals of care, counseling/discussion 11/28/2021   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/28/2021   Acute respiratory failure with hypoxia ([place]) 11/27/2021   Hypercalcemia 11/26/2021   AKI (acute kidney injury) ([place]) 12/02/2021   Prolonged INR 11/09/2021   UGIB (upper gastrointestinal bleed) 11/11/2021   Neck mass 11/21/2021   Hemoptysis 11/15/2021   Hepatic encephalopathy ([place]) 08/30/2020   Sacroiliitis ([place]) 09/30/2018   Bacteremia due to Gram-negative bacteria 09/12/2018   Cellulitis 06/16/2018  Morbid obesity with BMI of 40.0-44.9, adult ([place]) 02/12/2018   Sepsis ([place]) 06/28/2017   Iron deficiency anemia 05/19/2017   Thrombocytopenia ([place]) 04/01/2017   Anasarca 02/29/2016   Cirrhosis of liver without ascites ([place]) 02/17/2016   Dupuytren's contracture  of right hand 02/17/2016   Hyperbilirubinemia 02/17/2016   PCP:  Dontee Body, MD Pharmacy:   [id number] [place], [id number]: [phone]: [phone]    Social Determinants of Health (SDOH) Interventions    Readmission Risk Interventions     No data to display

## 2021-11-30 DIAGNOSIS — C321 Malignant neoplasm of supraglottis: Secondary | ICD-10-CM | POA: Diagnosis not present

## 2021-11-30 DIAGNOSIS — K7030 Alcoholic cirrhosis of liver without ascites: Secondary | ICD-10-CM | POA: Diagnosis not present

## 2021-11-30 DIAGNOSIS — J9601 Acute respiratory failure with hypoxia: Secondary | ICD-10-CM | POA: Diagnosis not present

## 2021-11-30 LAB — CBC WITH DIFFERENTIAL/PLATELET
Abs Immature Granulocytes: 0.13 10*3/uL — ABNORMAL HIGH (ref 0.00–0.07)
Basophils Absolute: 0 10*3/uL (ref 0.0–0.1)
Basophils Relative: 0 %
Eosinophils Absolute: 0.1 10*3/uL (ref 0.0–0.5)
Eosinophils Relative: 2 %
HCT: 27.1 % — ABNORMAL LOW (ref 39.0–52.0)
Hemoglobin: 8.7 g/dL — ABNORMAL LOW (ref 13.0–17.0)
Immature Granulocytes: 3 %
Lymphocytes Relative: 17 %
Lymphs Abs: 0.9 10*3/uL (ref 0.7–4.0)
MCH: 33.6 pg (ref 26.0–34.0)
MCHC: 32.1 g/dL (ref 30.0–36.0)
MCV: 104.6 fL — ABNORMAL HIGH (ref 80.0–100.0)
Monocytes Absolute: 0.4 10*3/uL (ref 0.1–1.0)
Monocytes Relative: 7 %
Neutro Abs: 3.7 10*3/uL (ref 1.7–7.7)
Neutrophils Relative %: 71 %
Platelets: 34 10*3/uL — ABNORMAL LOW (ref 150–400)
RBC: 2.59 MIL/uL — ABNORMAL LOW (ref 4.22–5.81)
RDW: 17.4 % — ABNORMAL HIGH (ref 11.5–15.5)
Smear Review: NORMAL
WBC: 5.2 10*3/uL (ref 4.0–10.5)
nRBC: 4.6 % — ABNORMAL HIGH (ref 0.0–0.2)

## 2021-11-30 LAB — PREPARE CRYOPRECIPITATE
Unit division: 0
Unit division: 0

## 2021-11-30 LAB — BPAM CRYOPRECIPITATE
Blood Product Expiration Date: 202311221629
Blood Product Expiration Date: 202311221629
ISSUE DATE / TIME: 202311221136
ISSUE DATE / TIME: 202311221218
Unit Type and Rh: 5100
Unit Type and Rh: 5100

## 2021-11-30 LAB — GLUCOSE, CAPILLARY
Glucose-Capillary: 71 mg/dL (ref 70–99)
Glucose-Capillary: 88 mg/dL (ref 70–99)
Glucose-Capillary: 82 mg/dL (ref 70–99)
Glucose-Capillary: 79 mg/dL (ref 70–99)
Glucose-Capillary: 86 mg/dL (ref 70–99)

## 2021-11-30 LAB — BASIC METABOLIC PANEL
Anion gap: 6 (ref 5–15)
Anion gap: 7 (ref 5–15)
BUN: 19 mg/dL (ref 6–20)
BUN: 17 mg/dL (ref 6–20)
CO2: 28 mmol/L (ref 22–32)
CO2: 28 mmol/L (ref 22–32)
Calcium: 9.9 mg/dL (ref 8.9–10.3)
Calcium: 9.4 mg/dL (ref 8.9–10.3)
Chloride: 112 mmol/L — ABNORMAL HIGH (ref 98–111)
Chloride: 113 mmol/L — ABNORMAL HIGH (ref 98–111)
Creatinine, Ser: 0.57 mg/dL — ABNORMAL LOW (ref 0.61–1.24)
Creatinine, Ser: 0.32 mg/dL — ABNORMAL LOW (ref 0.61–1.24)
GFR, Estimated: >60 mL/min (ref >60)
GFR, Estimated: >60 mL/min (ref >60)
Glucose, Bld: 92 mg/dL (ref 70–99)
Glucose, Bld: 81 mg/dL (ref 70–99)
Potassium: 3.8 mmol/L (ref 3.5–5.1)
Potassium: 4.7 mmol/L (ref 3.5–5.1)
Sodium: 146 mmol/L — ABNORMAL HIGH (ref 135–145)
Sodium: 148 mmol/L — ABNORMAL HIGH (ref 135–145)

## 2021-11-30 LAB — FIBRINOGEN: Fibrinogen: 221 mg/dL (ref 210–475)

## 2021-11-30 LAB — PHOSPHORUS: Phosphorus: 2.1 mg/dL — ABNORMAL LOW (ref 2.5–4.6)

## 2021-11-30 LAB — PROTIME-INR
INR: 1.9 — ABNORMAL HIGH (ref 0.8–1.2)
Prothrombin Time: 21.2 seconds — ABNORMAL HIGH (ref 11.4–15.2)

## 2021-11-30 LAB — APTT: aPTT: 40 seconds — ABNORMAL HIGH (ref 24–36)

## 2021-11-30 LAB — MAGNESIUM: Magnesium: 2.3 mg/dL (ref 1.7–2.4)

## 2021-11-30 MED ORDER — POTASSIUM PHOSPHATES 15 MMOLE/5ML IV SOLN
15.0000 mmol | Freq: Once | INTRAVENOUS | Status: AC
  Administered 2021-11-30: 15 mmol via INTRAVENOUS
  Filled 2021-11-30: qty 5

## 2021-11-30 MED ORDER — DEXTROSE IN LACTATED RINGERS 5 % IV SOLN: INTRAVENOUS | Status: DC

## 2021-11-30 NOTE — IPAL (Signed)
  Interdisciplinary Goals of Care Family Meeting   Date carried out: 11/30/2021  Location of the meeting: Phone conference  Member's involved: Physician and Family Member or next of kin  Durable Power of Attorney or acting medical decision maker: Son, Gabriel Rung    Discussion: We discussed goals of care for [employer] .  Explained that Mr. Watanabe was liberated from the ventilator today and is breathing through the tracheostomy and that he remains delirious given hepatic encephalopathy. Discussed with him that the overall trajectory of his care remains unchanged, with limited options in terms of enteral nutrition as well as treatment for his malignancy (given cirrhosis, tracheostomy and malnutrition). Devariae understand and is processing this information. I recommended hospice care for the management of Mr. Cilento's condition and Mr. Wynetta Emery understands the recommendations and will be discussing with his family. He will be here in person tomorrow.  Code status: Full Code  Disposition: Continue current acute care  Time spent for the meeting: 54 minutes   Armando Reichert, MD  11/30/2021, 6:10 PM

## 2021-11-30 NOTE — Progress Notes (Signed)
PHARMACY CONSULT NOTE  Pharmacy Consult for Electrolyte Monitoring and Replacement   Recent Labs: Potassium (mmol/L)  Date Value  11/30/2021 3.8   Magnesium (mg/dL)  Date Value  11/30/2021 2.3   Calcium (mg/dL)  Date Value  11/30/2021 9.9   Albumin (g/dL)  Date Value  11/25/2021 3.2 (L)   Phosphorus (mg/dL)  Date Value  11/30/2021 2.1 (L)   Sodium (mmol/L)  Date Value  11/30/2021 146 (H)   Assessment: 56 year old male with history of advanced alcohol associated liver cirrhosis, alcohol abuse currently in remission, thrombocytopenia, iron deficiency anemia, morbid obesity, who presents to the ED for chief concern of nausea and vomiting blood. Pharmacy is asked to follow and replace electrolytes while in CCU  Patient is currently intubated, sedated, and on mechanical ventilation in the ICU. Patient is NPO.   Goal of Therapy:  Electrolytes within normal limits  Plan: AKI resolving Scr improved with UOP now WNL/slowing. Will CTM fluid balance & f/u with intermittent lasix doses.   ---KPhos 15 mmol IV X 1 still infusing at time of labs ---recheck electrolytes on 11/23 with AM labs.   Dallie Piles 11/30/2021 7:27 AM

## 2021-11-30 NOTE — Progress Notes (Signed)
PHARMACY CONSULT NOTE  Pharmacy Consult for Electrolyte Monitoring and Replacement   Recent Labs: Potassium (mmol/L)  Date Value  11/29/2021 3.4 (L)   Magnesium (mg/dL)  Date Value  11/29/2021 2.4   Calcium (mg/dL)  Date Value  11/29/2021 10.0   Albumin (g/dL)  Date Value  11/25/2021 3.2 (L)   Phosphorus (mg/dL)  Date Value  11/29/2021 2.3 (L)   Sodium (mmol/L)  Date Value  11/29/2021 144   Assessment: 56 year old male with history of advanced alcohol associated liver cirrhosis, alcohol abuse currently in remission, thrombocytopenia, iron deficiency anemia, morbid obesity, who presents to the ED for chief concern of nausea and vomiting blood. Pharmacy is asked to follow and replace electrolytes while in CCU  Patient is currently intubated, sedated, and on mechanical ventilation in the ICU. Patient is NPO.    Goal of Therapy:  Electrolytes within normal limits  Plan: AKI resolving Scr improved with UOP now WNL/slowing. Will CTM fluid balance & f/u with intermittent lasix doses. Scr 1.45>1.24>1.04>0.73>0.58; I/O +10.2L; UOP 0.7>0.6>0.83m/k/h  11/22:  Phos @ 2207 = 2.3 Will order KPhos 15 mmol IV X 1 and recheck electrolytes on 11/23 with AM labs.   Bonita Brindisi D 11/30/2021 2:03 AM

## 2021-11-30 NOTE — Progress Notes (Signed)
NAME:  Jordan Hayden, MRN:  [id number], DOB:  24-Aug-1965, LOS: 7 ADMISSION DATE:  11/16/2021, CHIEF COMPLAINT:  respiratory failure   History of Present Illness:  56 year old male non-smoker with history of morbid obesity, and decompensated alcoholic cirrhosis (esophageal varices and hepatic encephalopathy) who presented to the hospital after an episode of hematemesis and worsening dysphagia.   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 There was also concern for malignant adenopathy in the neck with compression of the right internal jugular vein. He was evaluated by ENT and underwent urgent tracheostomy on 11/17/2021 for impending airway closure. GI was also consulted for concern for hematemesis in the setting of advanced cirrhosis. He was initially on PPI & Octreotide gtt, transitioned to PPI only.  Following tracheostomy, he's continued to have bleeding around the tracheostomy site and has received platelets, Vitamin K, FFP's and cryoprecipitate. ENT continues to follow his tracheostomy site for bleeding. He continues to be coagulopathic secondary to his liver disease. 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   Significant Hospital Events: Including procedures, antibiotic start and stop dates in addition to other pertinent events   11/14/2021: Admitted 11/30/2021: Tracheostomy placed by ENT. Aline and CVC placed 11/27/2021: Pt received 3 pools of cryoprecipitate due to fibrinogen level of 135  11/28/2021: Pts mentation slowly improving able to open eyes to voice; attempted SBT however pt failed placed back on full support FiO2 28%/PEEP 5   Interim History / Subjective:  Off sedation and on rectal lactulose, more awake this morning. Goals of care  conversation with family yesterday.  Objective   Blood pressure 107/69, pulse 69, temperature 97.8 F (36.6 C), temperature source Axillary, resp. rate 16, height '5\' 8"'$  (1.727 m), weight 135.6 kg, SpO2 100 %.    Vent Mode: PRVC FiO2 (%):  [28 %] 28 % Set Rate:  [16 bmp] 16 bmp Vt Set:  [500 mL] 500 mL PEEP:  [5 cmH20] 5 cmH20 Pressure Support:  [10 cmH20] 10 cmH20 Plateau Pressure:  [16 cmH20-17 cmH20] 16 cmH20   Intake/Output Summary (Last 24 hours) at 11/30/2021 0733 Last data filed at 11/30/2021 0600 Gross per 24 hour  Intake 1424.2 ml  Output 1585 ml  Net -160.8 ml    Filed Weights   11/28/21 0500 11/29/21 0500 11/30/21 0500  Weight: 128.1 kg 128.1 kg 135.6 kg    Examination: Physical Exam Constitutional:      General: He is not in acute distress.    Appearance: He is obese. He is ill-appearing.  Neck:     Comments: Tracheostomy tube in place Cardiovascular:     Rate and Rhythm: Normal rate and regular rhythm.     Heart sounds: Normal heart sounds.  Pulmonary:     Breath sounds: Normal breath sounds.     Comments: Ventilated breath sounds Abdominal:     General: There is distension.  Neurological:     Comments: More awake today. Opens eyes to commands, follows commands.     Assessment & Plan:   56 year old male with history of decompensated liver cirrhosis presents with hemoptysis/hematemesis and dysphagia, found to have a large supraglotic neck mass compromising the airway and requiring emergent tracheostomy tube placement.   Neurology #Toxic Metabolic Encephalopathy #Sedation   Encephalopathy in  the setting of sedation and hepatic encephalopathy. Rectal lactulose initiated for management of hepatic encephalopathy and sedation has been held, with improvement in mental status. He is more awake and interactive, but continues to be a little agitated. Will avoid sedation to facilitate weaning.   -goal RASS 0 -continue daily lactulose retention enema    Respiratory #Acute Hypoxic Respiratory Failure #Supraglottic tumor   S/p tracheostomy in the setting of a supraglottic mass with impending respiratory failure. On Minimal ventilator settings with barrier to liberation is mental status. Will attempt to wean sedation and attempt PSV followed by trach collar once tolerates.  -daily SBT -trach collar trial once tolerates SBT   Cardiovascular #Hemorrhagic Shock - resolved   Hemodynamically stable with MAP's in the 70's and not requiring any vasopressor support. Continue to monitor.   GI #Decompensated Liver Cirrhosis #Esophageal Varices   MELD-Na 19 and Child's Pugh Class C. On rectal lactulose for the management of hepatic encephalopathy. Does have a liver lesion that is concerning for metastatic malignancy vs HCC, albeit high risk for biopsy given his coagulopathy. Would not be a liver transplant candidate given neck mass malignant. Unable to place NG or OG tube given supraglottic tumor. IR PEG not possible, would have to be surgical. This is all contingent on goals of care discussions.  -rectal lactulose -continue goals of care conversations with family   Renal #AKI - resolving #Hypernatremia  Kidney function has improved, will continue to avoid nephrotoxic medications and replete electrolytes (K and Phos). Patient is hypernatremic, was on D5 with improvement, today at 146. Will hold on D5W at the moment, and consider re-initiation. Will closely monitor sodium levels.   Endo #Type 2 Diabetes   On Insulin sliding scale. 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 Has received blood products and cryoprecipitate with improvement in fibrinogen. Will monitor markers of DIC (fibrinogen, D-dimer, PT/PTT - FDP's not available in our lab) and replete as indicated.  Neck mass biopsy shows advanced poorly differentiated squamous cell carcinoma. Unfortunately, given functional status and cirrhosis, patient is a poor candidate for therapy. Goals of care conversations initiated with family and they are aware of the gravity of his situation. Highly appreciate hematology consult.   Infectious Diseases   Received Ceftriaxone for SBP prophylaxis. discontinued.  Best Practice (right click and "Reselect all SmartList Selections" daily)   Diet/type: NPO DVT prophylaxis: not indicated GI prophylaxis: PPI Lines: N/A Foley:  N/A Code Status:  full code Last date of multidisciplinary goals of care discussion [11/30/2021]  Labs   CBC: Recent Labs  Lab 11/27/21 1258 11/28/21 0314 11/28/21 2208 11/29/21 0552 11/30/21 0421  WBC 3.2* 3.8* 4.7 4.7 5.2  NEUTROABS 2.5 2.9 3.5 3.6 3.7  HGB 7.5* 7.8* 8.3* 8.5* 8.7*  HCT 22.9* 23.6* 25.5* 25.9* 27.1*  MCV 102.2* 104.9* 104.5* 104.0* 104.6*  PLT 39* 49* 43* 38* 34*     Basic Metabolic Panel: Recent Labs  Lab 11/26/21 0417 11/27/21 0445 11/28/21 0314 11/29/21 0552 11/29/21 2203 11/30/21 0421  NA 146* 151* 151* 144  --  146*  K 3.5 3.4* 3.2* 3.4*  --  3.8  CL 110 114* 114* 111  --  112*  CO2 29 33* 31 30  --  28  GLUCOSE 145* 152* 136* 119*  --  92  BUN 42* 37* 30* 23*  --  19  CREATININE 1.24 1.04  0.73 0.58*  --  0.57*  CALCIUM 11.8* 11.3* 10.5* 10.0  --  9.9  MG 2.8* 2.9* 2.7* 2.4  --  2.3  PHOS 2.4* 2.2* 1.7* 1.3* 2.3* 2.1*    GFR: Estimated Creatinine Clearance: 142.3 mL/min (A) (by C-G formula based on SCr of 0.57 mg/dL (L)). Recent Labs  Lab 11/28/21 0314 11/28/21 2208 11/29/21 0552 11/30/21 0421  WBC 3.8* 4.7 4.7 5.2     Liver Function Tests: Recent Labs  Lab 11/30/2021 1422 11/19/2021 0311 11/25/21 0457  AST 68* 80* 74*  ALT 30 34 29  ALKPHOS 89 104 73  BILITOT 4.4* 4.9* 4.9*  PROT 6.1* 6.8 6.0*  ALBUMIN 2.1* 2.3* 3.2*    No results for input(s): "LIPASE", "AMYLASE" in the  last 168 hours. Recent Labs  Lab 11/12/2021 1422 12/02/2021 2028 11/25/21 0457  AMMONIA 48* 33 32     ABG    Component Value Date/Time   PHART 7.45 11/19/2021 1549   PCO2ART 43 12/02/2021 1549   PO2ART 101 12/02/2021 1549   HCO3 29.9 (H) 12/02/2021 1549   O2SAT 99.2 11/19/2021 1549     Coagulation Profile: Recent Labs  Lab 11/26/21 0417 11/27/21 0445 11/28/21 0314 11/29/21 0552 11/30/21 0421  INR 2.2* 1.9* 1.8* 1.8* 1.9*     Cardiac Enzymes: No results for input(s): "CKTOTAL", "CKMB", "CKMBINDEX", "TROPONINI" in the last 168 hours.  HbA1C: Hgb A1c MFr Bld  Date/Time Value Ref Range Status  11/27/2021 07:59 PM 4.4 (L) 4.8 - 5.6 % Final    Comment:    (NOTE)         Prediabetes: 5.7 - 6.4         Diabetes: >6.4         Glycemic control for adults with diabetes: <7.0     CBG: Recent Labs  Lab 11/29/21 1115 11/29/21 1628 11/29/21 1942 11/29/21 2333 11/30/21 0356  GLUCAP 92 104* 84 75 71     Review of Systems:   Unable to obtain  Past Medical History:  He,  has a past medical history of Alcohol abuse, Alcoholic cirrhosis ([place]), Alcoholism ([place]), Arthritis, and Cirrhosis ([place]).   Surgical History:   Past Surgical History:  Procedure Laterality Date   DIRECT LARYNGOSCOPY  11/30/2021   Procedure: DIRECT LARYNGOSCOPY WITH BIOPSIES;  Surgeon: Carloyn Manner, MD;  Location: [facility] ORS;  Service: ENT;;   ESOPHAGOGASTRODUODENOSCOPY (EGD) WITH PROPOFOL N/A 07/01/2017   Procedure: ESOPHAGOGASTRODUODENOSCOPY (EGD) WITH PROPOFOL;  Surgeon: Lucilla Lame, MD;  Location: [facility];  Service: Endoscopy;  Laterality: N/A;   TRACHEOSTOMY TUBE PLACEMENT N/A 11/21/2021   Procedure: AWAKE TRACHEOSTOMY;  Surgeon: Carloyn Manner, MD;  Location: [facility] ORS;  Service: ENT;  Laterality: N/A;     Social History:   reports that he has never smoked. He has never used smokeless tobacco. He reports that he does not drink alcohol and does not use drugs.   Family History:   His family history includes Cervical cancer in his sister; Cirrhosis in his brother; Diabetes in his father; Hypertension in his mother.   Allergies Allergies  Allergen Reactions   Hydrocodone-Acetaminophen Swelling    Mouth Swelling    Penicillins Swelling     Home Medications  Prior to Admission medications   Medication Sig Start Date End Date Taking? Authorizing Provider  chlorhexidine (PERIDEX) 0.12 % solution Use as directed 5 mLs in the mouth or throat 2 (two) times daily. 11/16/21  Yes [provider]  albuterol (VENTOLIN HFA) 108 (90 Base) MCG/ACT inhaler  Inhale into the lungs. Patient not taking: Reported on 11/18/2021 02/02/21 02/02/22  [provider]  aspirin EC 81 MG tablet Take 81 mg by mouth daily. Patient not taking: Reported on 07/25/2021 03/05/16   [provider]  doxycycline (VIBRAMYCIN) 100 MG capsule Take 100 mg by mouth 2 (two) times daily. Patient not taking: Reported on 11/26/2021 07/19/21   [provider]  ferrous sulfate 325 (65 FE) MG tablet Take 325 mg by mouth every morning. 06/05/20   [provider]  furosemide (LASIX) 20 MG tablet Take 1 tablet (20 mg total) by mouth daily. 07/02/17 08/31/20  Salary, Holly Bodily D, MD  hydrOXYzine (ATARAX/VISTARIL) 25 MG tablet Take 25 mg by mouth 3 (three) times daily as needed. 04/01/17   [provider]  lactulose (CHRONULAC) 10 GM/15ML solution Take 30 mLs (20 g total) by mouth 3 (three) times daily. 08/31/20   Ezekiel Slocumb, DO  meloxicam (MOBIC) 15 MG tablet Take 1 tablet (15 mg total) by mouth daily. 07/22/18   Cuthriell, Charline Bills, PA-C  pantoprazole (PROTONIX) 40 MG tablet Take 1 tablet (40 mg total) by mouth daily. 07/02/17 08/31/20  Salary, Avel Peace, MD  potassium chloride SA (K-DUR,KLOR-CON) 20 MEQ tablet Take 2 tablets (40 mEq total) by mouth 2 (two) times daily. 07/02/17   Salary, Avel Peace, MD  spironolactone (ALDACTONE) 25 MG tablet Take 0.5 tablets (12.5 mg  total) by mouth 2 (two) times daily. 07/02/17   Salary, Avel Peace, MD  traMADol (ULTRAM) 50 MG tablet Take 50 mg by mouth every 6 (six) hours as needed for moderate pain. Patient not taking: Reported on 12/02/2021 11/16/21   [provider]  XIFAXAN 550 MG TABS tablet Take 550 mg by mouth 2 (two) times daily. 08/08/20   [provider]     Critical care time: 40 minutes

## 2021-11-30 NOTE — Progress Notes (Signed)
Assessed patient for continuation of trach collar trial. Patient is tolerating well at this time. Trach care done. Patient shakes his head "No" that he does not want to go back on ventilator at this time. No distress noted. Will continue trach collar trial

## 2021-11-30 NOTE — Progress Notes (Signed)
Dr Genia Harold placed pt into SBT with 8 peep and 5pressure support at [place].

## 2021-12-01 DIAGNOSIS — J9601 Acute respiratory failure with hypoxia: Secondary | ICD-10-CM | POA: Diagnosis not present

## 2021-12-01 DIAGNOSIS — K7030 Alcoholic cirrhosis of liver without ascites: Secondary | ICD-10-CM | POA: Diagnosis not present

## 2021-12-01 DIAGNOSIS — Z7189 Other specified counseling: Secondary | ICD-10-CM | POA: Diagnosis not present

## 2021-12-01 DIAGNOSIS — C321 Malignant neoplasm of supraglottis: Secondary | ICD-10-CM | POA: Diagnosis not present

## 2021-12-01 DIAGNOSIS — R221 Localized swelling, mass and lump, neck: Secondary | ICD-10-CM | POA: Diagnosis not present

## 2021-12-01 LAB — BASIC METABOLIC PANEL
Anion gap: 6 (ref 5–15)
BUN: 16 mg/dL (ref 6–20)
CO2: 29 mmol/L (ref 22–32)
Calcium: 9.8 mg/dL (ref 8.9–10.3)
Chloride: 113 mmol/L — ABNORMAL HIGH (ref 98–111)
Creatinine, Ser: 0.41 mg/dL — ABNORMAL LOW (ref 0.61–1.24)
GFR, Estimated: >60 mL/min (ref >60)
Glucose, Bld: 92 mg/dL (ref 70–99)
Potassium: 4 mmol/L (ref 3.5–5.1)
Sodium: 148 mmol/L — ABNORMAL HIGH (ref 135–145)

## 2021-12-01 LAB — HEPATIC FUNCTION PANEL
ALT: 34 U/L (ref 0–44)
AST: 58 U/L — ABNORMAL HIGH (ref 15–41)
Albumin: 3 g/dL — ABNORMAL LOW (ref 3.5–5.0)
Alkaline Phosphatase: 79 U/L (ref 38–126)
Bilirubin, Direct: 6.1 mg/dL — ABNORMAL HIGH (ref 0.0–0.2)
Indirect Bilirubin: 5 mg/dL — ABNORMAL HIGH (ref 0.3–0.9)
Total Bilirubin: 11.1 mg/dL — ABNORMAL HIGH (ref 0.3–1.2)
Total Protein: 5.9 g/dL — ABNORMAL LOW (ref 6.5–8.1)

## 2021-12-01 LAB — GLUCOSE, CAPILLARY
Glucose-Capillary: 77 mg/dL (ref 70–99)
Glucose-Capillary: 77 mg/dL (ref 70–99)
Glucose-Capillary: 81 mg/dL (ref 70–99)
Glucose-Capillary: 85 mg/dL (ref 70–99)
Glucose-Capillary: 87 mg/dL (ref 70–99)
Glucose-Capillary: 91 mg/dL (ref 70–99)
Glucose-Capillary: 92 mg/dL (ref 70–99)

## 2021-12-01 LAB — CBC WITH DIFFERENTIAL/PLATELET
Abs Immature Granulocytes: 0.13 10*3/uL — ABNORMAL HIGH (ref 0.00–0.07)
Basophils Absolute: 0 10*3/uL (ref 0.0–0.1)
Basophils Relative: 0 %
Eosinophils Absolute: 0.1 10*3/uL (ref 0.0–0.5)
Eosinophils Relative: 2 %
HCT: 27.6 % — ABNORMAL LOW (ref 39.0–52.0)
Hemoglobin: 9 g/dL — ABNORMAL LOW (ref 13.0–17.0)
Immature Granulocytes: 2 %
Lymphocytes Relative: 21 %
Lymphs Abs: 1.1 10*3/uL (ref 0.7–4.0)
MCH: 34.2 pg — ABNORMAL HIGH (ref 26.0–34.0)
MCHC: 32.6 g/dL (ref 30.0–36.0)
MCV: 104.9 fL — ABNORMAL HIGH (ref 80.0–100.0)
Monocytes Absolute: 0.3 10*3/uL (ref 0.1–1.0)
Monocytes Relative: 6 %
Neutro Abs: 3.7 10*3/uL (ref 1.7–7.7)
Neutrophils Relative %: 69 %
Platelets: 31 10*3/uL — ABNORMAL LOW (ref 150–400)
RBC: 2.63 MIL/uL — ABNORMAL LOW (ref 4.22–5.81)
RDW: 17.8 % — ABNORMAL HIGH (ref 11.5–15.5)
WBC: 5.4 10*3/uL (ref 4.0–10.5)
nRBC: 2.8 % — ABNORMAL HIGH (ref 0.0–0.2)

## 2021-12-01 LAB — FIBRINOGEN: Fibrinogen: 188 mg/dL — ABNORMAL LOW (ref 210–475)

## 2021-12-01 LAB — PHOSPHORUS: Phosphorus: 1.5 mg/dL — ABNORMAL LOW (ref 2.5–4.6)

## 2021-12-01 LAB — MAGNESIUM: Magnesium: 2.3 mg/dL (ref 1.7–2.4)

## 2021-12-01 MED ORDER — FENTANYL CITRATE PF 50 MCG/ML IJ SOSY
25.0000 ug | PREFILLED_SYRINGE | INTRAMUSCULAR | Status: DC | PRN
  Administered 2021-12-01 – 2021-12-03 (×16): 50 ug via INTRAVENOUS
  Filled 2021-12-01 (×17): qty 1

## 2021-12-01 MED ORDER — DEXMEDETOMIDINE HCL IN NACL 400 MCG/100ML IV SOLN: 0.2000 ug/kg/h | INTRAVENOUS | Status: DC

## 2021-12-01 MED ORDER — LORAZEPAM 2 MG/ML IJ SOLN
1.0000 mg | Freq: Once | INTRAMUSCULAR | Status: AC
  Administered 2021-12-01: 1 mg via INTRAVENOUS
  Filled 2021-12-01: qty 1

## 2021-12-01 MED ORDER — POTASSIUM PHOSPHATES 15 MMOLE/5ML IV SOLN
30.0000 mmol | Freq: Once | INTRAVENOUS | Status: AC
  Administered 2021-12-01: 30 mmol via INTRAVENOUS
  Filled 2021-12-01: qty 10

## 2021-12-01 MED ORDER — OLANZAPINE 10 MG IM SOLR
2.5000 mg | Freq: Once | INTRAMUSCULAR | Status: AC
  Administered 2021-12-01: 2.5 mg via INTRAVENOUS
  Filled 2021-12-01: qty 10

## 2021-12-01 MED ORDER — PANTOPRAZOLE SODIUM 40 MG IV SOLR
40.0000 mg | Freq: Every day | INTRAVENOUS | Status: DC
  Administered 2021-12-01: 40 mg via INTRAVENOUS
  Filled 2021-12-01: qty 10

## 2021-12-01 NOTE — Progress Notes (Signed)
NAME:  Jordan Hayden, MRN:  [id number], DOB:  1965/08/28, LOS: 8 ADMISSION DATE:  12/07/2021, CHIEF COMPLAINT:  respiratory failure   History of Present Illness:  56 year old male non-smoker with history of morbid obesity, and decompensated alcoholic cirrhosis (esophageal varices and hepatic encephalopathy) who presented to the hospital after an episode of hematemesis and worsening dysphagia.   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 There was also concern for malignant adenopathy in the neck with compression of the right internal jugular vein. He was evaluated by ENT and underwent urgent tracheostomy on 11/26/2021 for impending airway closure. GI was also consulted for concern for hematemesis in the setting of advanced cirrhosis. He was initially on PPI & Octreotide gtt, transitioned to PPI only.  Following tracheostomy, he's continued to have bleeding around the tracheostomy site and has received platelets, Vitamin K, FFP's and cryoprecipitate. ENT continues to follow his tracheostomy site for bleeding. He continues to be coagulopathic secondary to his liver disease. 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   Significant Hospital Events: Including procedures, antibiotic start and stop dates in addition to other pertinent events   12/07/2021: Admitted 11/22/2021: Tracheostomy placed by ENT. Aline and CVC placed 11/27/2021: Pt received 3 pools of cryoprecipitate due to fibrinogen level of 135  11/28/2021: Pts mentation slowly improving able to open eyes to voice; attempted SBT however pt failed placed back on full support FiO2 28%/PEEP 5   Interim History / Subjective:  Off sedation and on rectal lactulose, more awake this morning. Goals of care  conversation with family yesterday.  Objective   Blood pressure 129/61, pulse 80, temperature 97.8 F (36.6 C), temperature source Axillary, resp. rate 16, height '5\' 8"'$  (1.727 m), weight 135.6 kg, SpO2 97 %.    Vent Mode: PRVC FiO2 (%):  [30 %] 30 % Set Rate:  [16 bmp] 16 bmp Vt Set:  [500 mL] 500 mL PEEP:  [5 cmH20] 5 cmH20   Intake/Output Summary (Last 24 hours) at 12/01/2021 0803 Last data filed at 12/01/2021 0277 Gross per 24 hour  Intake 692.33 ml  Output 1550 ml  Net -857.67 ml    Filed Weights   11/29/21 0500 11/30/21 0500 12/01/21 0500  Weight: 128.1 kg 135.6 kg 135.6 kg    Examination: Physical Exam Constitutional:      General: He is not in acute distress.    Appearance: He is obese. He is ill-appearing.  Neck:     Comments: Tracheostomy tube in place Cardiovascular:     Rate and Rhythm: Normal rate and regular rhythm.     Heart sounds: Normal heart sounds.  Pulmonary:     Breath sounds: Normal breath sounds.     Comments: Ventilated breath sounds Abdominal:     General: There is distension.  Neurological:     Mental Status: He is disoriented.     Comments: awake today and moving all extremities without purpose.     Assessment & Plan:   56 year old male with history of decompensated liver cirrhosis presents with hemoptysis/hematemesis and dysphagia, found to have a large supraglotic neck mass compromising the airway and requiring emergent tracheostomy tube placement.   Neurology #Toxic Metabolic Encephalopathy #Sedation   Encephalopathy in the setting of sedation and hepatic encephalopathy.  Rectal lactulose initiated for management of hepatic encephalopathy and sedation has been discontinued, with improvement in mental status. He is more awake and interactive, but continues to be a little agitated. Will avoid sedation to facilitate weaning.   -continue daily lactulose retention enema -PRN olanzapine for agitation -1 to 1 sitter for safety    Respiratory #Acute Hypoxic Respiratory Failure #Supraglottic tumor   S/p tracheostomy in the setting of a supraglottic mass with impending respiratory failure. Weaned off the vent yesterday and is on trach collar today.  -trach collar trial  Cardiovascular #Hemorrhagic Shock - resolved  Hemodynamically stable with MAP's in the 70's and not requiring any vasopressor support. Continue to monitor.   GI #Decompensated Liver Cirrhosis #Esophageal Varices   MELD-Na 19 and Child's Pugh Class C. On rectal lactulose for the management of hepatic encephalopathy. Does have a liver lesion that is concerning for metastatic malignancy vs HCC, albeit high risk for biopsy given his coagulopathy. Would not be a liver transplant candidate given neck mass malignant. Unable to place NG or OG tube given supraglottic tumor. IR PEG not possible, would have to be surgical. This is all contingent on goals of care discussions.  -rectal lactulose -continue goals of care conversations with family   Renal #AKI - resolving #Hypernatremia  Kidney function has improved, will continue to avoid nephrotoxic medications and replete electrolytes (K and Phos). Patient is hypernatremic, was on D5 with improvement. Given NPO status, starting maintenance fluids and will continue to monitor electrolytes closely,   Endo #Type 2 Diabetes   On Insulin sliding scale. 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 Has received blood products and cryoprecipitate with improvement in fibrinogen. Will monitor markers of DIC (fibrinogen, D-dimer, PT/PTT) and replete as indicated. Neck mass biopsy shows advanced poorly differentiated squamous cell carcinoma. Unfortunately, given functional status and cirrhosis, patient is a poor candidate for therapy. Goals of  care conversations initiated with family and they are aware of the gravity of his situation. Highly appreciate hematology consult. I have called his son yesterday and we did discuss this again, he will present to the bedside today to discuss further. I have recommended they consider hospice moving forward.   Infectious Diseases   Received Ceftriaxone for SBP prophylaxis. discontinued.  Best Practice (right click and "Reselect all SmartList Selections" daily)   Diet/type: NPO DVT prophylaxis: not indicated GI prophylaxis: PPI Lines: N/A Foley:  N/A Code Status:  full code Last date of multidisciplinary goals of care discussion [12/01/2021]  Labs   CBC: Recent Labs  Lab 11/28/21 0314 11/28/21 2208 11/29/21 0552 11/30/21 0421 12/01/21 0314  WBC 3.8* 4.7 4.7 5.2 5.4  NEUTROABS 2.9 3.5 3.6 3.7 3.7  HGB 7.8* 8.3* 8.5* 8.7* 9.0*  HCT 23.6* 25.5* 25.9* 27.1* 27.6*  MCV 104.9* 104.5* 104.0* 104.6* 104.9*  PLT 49* 43* 38* 34* 31*     Basic Metabolic Panel: Recent Labs  Lab 11/27/21 0445 11/28/21 0314 11/29/21 0552 11/29/21 2203 11/30/21 0421 11/30/21 1901 12/01/21 0314  NA 151* 151* 144  --  146* 148* 148*  K 3.4* 3.2* 3.4*  --  3.8 4.7 4.0  CL 114* 114* 111  --  112* 113* 113*  CO2 33* 31 30  --  '28 28 29  '$ GLUCOSE 152* 136* 119*  --  92 81 92  BUN 37* 30* 23*  --  19 17  16  CREATININE 1.04 0.73 0.58*  --  0.57* 0.32* 0.41*  CALCIUM 11.3* 10.5* 10.0  --  9.9 9.4 9.8  MG 2.9* 2.7* 2.4  --  2.3  --  2.3  PHOS 2.2* 1.7* 1.3* 2.3* 2.1*  --  1.5*    GFR: Estimated Creatinine Clearance: 142.3 mL/min (A) (by C-G formula based on SCr of 0.41 mg/dL (L)). Recent Labs  Lab 11/28/21 2208 11/29/21 0552 11/30/21 0421 12/01/21 0314  WBC 4.7 4.7 5.2 5.4     Liver Function Tests: Recent Labs  Lab 11/25/21 0457 12/01/21 0314  AST 74* 58*  ALT 29 34  ALKPHOS 73 79  BILITOT 4.9* 11.1*  PROT 6.0* 5.9*  ALBUMIN 3.2* 3.0*    No results for input(s): "LIPASE", "AMYLASE"  in the last 168 hours. Recent Labs  Lab 11/25/2021 2028 11/25/21 0457  AMMONIA 33 32     ABG    Component Value Date/Time   PHART 7.45 11/12/2021 1549   PCO2ART 43 11/22/2021 1549   PO2ART 101 12/07/2021 1549   HCO3 29.9 (H) 11/27/2021 1549   O2SAT 99.2 11/11/2021 1549     Coagulation Profile: Recent Labs  Lab 11/26/21 0417 11/27/21 0445 11/28/21 0314 11/29/21 0552 11/30/21 0421  INR 2.2* 1.9* 1.8* 1.8* 1.9*     Cardiac Enzymes: No results for input(s): "CKTOTAL", "CKMB", "CKMBINDEX", "TROPONINI" in the last 168 hours.  HbA1C: Hgb A1c MFr Bld  Date/Time Value Ref Range Status  11/10/2021 07:59 PM 4.4 (L) 4.8 - 5.6 % Final    Comment:    (NOTE)         Prediabetes: 5.7 - 6.4         Diabetes: >6.4         Glycemic control for adults with diabetes: <7.0     CBG: Recent Labs  Lab 11/30/21 1131 11/30/21 1641 11/30/21 1947 12/01/21 0001 12/01/21 0402  GLUCAP 82 79 86 85 87     Review of Systems:   Unable to obtain  Past Medical History:  He,  has a past medical history of Alcohol abuse, Alcoholic cirrhosis ([place]), Alcoholism ([place]), Arthritis, and Cirrhosis ([place]).   Surgical History:   Past Surgical History:  Procedure Laterality Date   DIRECT LARYNGOSCOPY  11/18/2021   Procedure: DIRECT LARYNGOSCOPY WITH BIOPSIES;  Surgeon: Carloyn Manner, MD;  Location: [facility] ORS;  Service: ENT;;   ESOPHAGOGASTRODUODENOSCOPY (EGD) WITH PROPOFOL N/A 07/01/2017   Procedure: ESOPHAGOGASTRODUODENOSCOPY (EGD) WITH PROPOFOL;  Surgeon: Lucilla Lame, MD;  Location: [facility] ENDOSCOPY;  Service: Endoscopy;  Laterality: N/A;   TRACHEOSTOMY TUBE PLACEMENT N/A 11/28/2021   Procedure: AWAKE TRACHEOSTOMY;  Surgeon: Carloyn Manner, MD;  Location: [facility] ORS;  Service: ENT;  Laterality: N/A;     Social History:   reports that he has never smoked. He has never used smokeless tobacco. He reports that he does not drink alcohol and does not use drugs.   Family History:  His family  history includes Cervical cancer in his sister; Cirrhosis in his brother; Diabetes in his father; Hypertension in his mother.   Allergies Allergies  Allergen Reactions   Hydrocodone-Acetaminophen Swelling    Mouth Swelling    Penicillins Swelling     Home Medications  Prior to Admission medications   Medication Sig Start Date End Date Taking? Authorizing Provider  chlorhexidine (PERIDEX) 0.12 % solution Use as directed 5 mLs in the mouth or throat 2 (two) times daily. 11/16/21  Yes [provider]  albuterol (VENTOLIN HFA) 108 (90 Base)  MCG/ACT inhaler Inhale into the lungs. Patient not taking: Reported on 11/28/2021 02/02/21 02/02/22  [provider]  aspirin EC 81 MG tablet Take 81 mg by mouth daily. Patient not taking: Reported on 07/25/2021 03/05/16   [provider]  doxycycline (VIBRAMYCIN) 100 MG capsule Take 100 mg by mouth 2 (two) times daily. Patient not taking: Reported on 12/02/2021 07/19/21   [provider]  ferrous sulfate 325 (65 FE) MG tablet Take 325 mg by mouth every morning. 06/05/20   [provider]  furosemide (LASIX) 20 MG tablet Take 1 tablet (20 mg total) by mouth daily. 07/02/17 08/31/20  Salary, Holly Bodily D, MD  hydrOXYzine (ATARAX/VISTARIL) 25 MG tablet Take 25 mg by mouth 3 (three) times daily as needed. 04/01/17   [provider]  lactulose (CHRONULAC) 10 GM/15ML solution Take 30 mLs (20 g total) by mouth 3 (three) times daily. 08/31/20   Ezekiel Slocumb, DO  meloxicam (MOBIC) 15 MG tablet Take 1 tablet (15 mg total) by mouth daily. 07/22/18   Cuthriell, Charline Bills, PA-C  pantoprazole (PROTONIX) 40 MG tablet Take 1 tablet (40 mg total) by mouth daily. 07/02/17 08/31/20  Salary, Avel Peace, MD  potassium chloride SA (K-DUR,KLOR-CON) 20 MEQ tablet Take 2 tablets (40 mEq total) by mouth 2 (two) times daily. 07/02/17   Salary, Avel Peace, MD  spironolactone (ALDACTONE) 25 MG tablet Take 0.5 tablets (12.5 mg total) by mouth 2  (two) times daily. 07/02/17   Salary, Avel Peace, MD  traMADol (ULTRAM) 50 MG tablet Take 50 mg by mouth every 6 (six) hours as needed for moderate pain. Patient not taking: Reported on 12/05/2021 11/16/21   [provider]  XIFAXAN 550 MG TABS tablet Take 550 mg by mouth 2 (two) times daily. 08/08/20   [provider]     Critical care time: 45 minutes

## 2021-12-01 NOTE — Progress Notes (Signed)
PHARMACY CONSULT NOTE  Pharmacy Consult for Electrolyte Monitoring and Replacement   Recent Labs: Potassium (mmol/L)  Date Value  12/01/2021 4.0   Magnesium (mg/dL)  Date Value  12/01/2021 2.3   Calcium (mg/dL)  Date Value  12/01/2021 9.8   Albumin (g/dL)  Date Value  12/01/2021 3.0 (L)   Phosphorus (mg/dL)  Date Value  12/01/2021 1.5 (L)   Sodium (mmol/L)  Date Value  12/01/2021 148 (H)   Assessment: 56 year old male with history of advanced alcohol associated liver cirrhosis, alcohol abuse currently in remission, thrombocytopenia, iron deficiency anemia, morbid obesity, who presents to the ED for chief concern of nausea and vomiting blood. Pharmacy is asked to follow and replace electrolytes while in CCU  Patient is currently intubated, sedated, and on mechanical ventilation in the ICU. Patient is NPO.  MIVF: D5LR '@50'$ ,   Goal of Therapy:  Electrolytes within normal limits  Plan:   K 4.7>4: Despite 69mq from [facility] on 11/23 Will receive 431m from [facility]  Phos 2.1>1.5: [facility] 15 mmol IV x1 still infusing at time of labs Will replete with [facility] 3065m x1 CTM and replace PRN, next with AM labs 11/25 0500 BraLorna Dibble/24/2023 8:07 AM

## 2021-12-01 NOTE — Progress Notes (Signed)
Nutrition Follow-up  DOCUMENTATION CODES:   Morbid obesity  INTERVENTION:   -RD will follow for goals of care -If aggressive care is warranted and surgical g-tube is placed, recommend:  Initiate Osmolite 1.5 @ 20 ml/hr and increase by 10 ml every 12 hours to goal rate of 60 ml/hr.   60 ml Prosource TF BID.    155 ml free water flush every 4 hours  Tube feeding regimen provides 2320 kcal (100% of needs), 130 grams of protein, and 1097 ml of H2O. Total free water: 2027 ml daily   -If feedings are started, monitor K, Mg, and Phos daily and replete as appropriate secondary to high refeeding risk   NUTRITION DIAGNOSIS:   Inadequate oral intake related to inability to eat (right supraglottic mass) as evidenced by NPO status.  Ongoing  GOAL:   Patient will meet greater than or equal to 90% of their needs  Unmet  MONITOR:   Diet advancement  REASON FOR ASSESSMENT:   Ventilator    ASSESSMENT:   56 y/o male with h/o etoh abuse and cirrhosis who is admitted with new right supraglottic mass s/p tracheostomy 11/17.  11/23- off vent, transitioned to trach collar  Reviewed I/O's: -879 ml x 24 hours and +9.1 L since admission  UOP: 1.5 L x 24 hours    Case discussed with MD, RN, and during ICU rounds. Pt off vent and transitioned to trach collar. RN reports pt is very agitated and uncomfortable. Safety sitter has been requested. Plan for family meeting today. Per MD, pt family is learning towards hospice/ comfort care. Pt is unable to swallow PO secondary to mass. Unable to place NGT due to mass and IR unable to place due to anatomy. Per MD, will place surgical g-tube if aggressive care is desired.   Medications reviewed and include colace, folic acid, lactulose, miralax, and thiamine  Labs reviewed: Na: 148, Phos: 1.5 (on IV supplementation), CBGS: 81-92 (inpatient orders for glycemic control are 0-15 units insulin aspart every 4 hours).    Diet Order:   Diet Order              Diet NPO time specified  Diet effective now                   EDUCATION NEEDS:   No education needs have been identified at this time  Skin:  Skin Assessment: Skin Integrity Issues: Skin Integrity Issues:: Unstageable, Incisions Unstageable: rt heel Incisions: closed neck  Last BM:  12/01/21 (via rectal tube)  Height:   Ht Readings from Last 1 Encounters:  11/19/2021 '5\' 8"'$  (1.727 m)    Weight:   Wt Readings from Last 1 Encounters:  12/01/21 135.6 kg    Ideal Body Weight:  70 kg  BMI:  Body mass index is 45.45 kg/m.  Estimated Nutritional Needs:   Kcal:  2100-2300  Protein:  125-140 grams  Fluid:  > 2 L    Loistine Chance, RD, LDN, [place] Registered Dietitian II Certified Diabetes Care and Education Specialist Please refer to [facility] for RD and/or RD on-call/weekend/after hours pager

## 2021-12-01 NOTE — Progress Notes (Signed)
Throughout the day patient has required prn pain medication Q2H with significant grimacing, extension of extremities, and tears in the eyes before the next prn dosage due; Dr. Genia Harold notified that pain medication is only providing little relief and the patient is uncomfortable, Dr. Genia Harold instructed to continue given prn pain medication. At approximately 1750 patient's saturations began to fluctuate between the low and high 80s, trach required suctioning five times which produced copious amounts of thick pink and tan colored mucous, patient's oxygen saturation still continued to fluctuate going down to the high 70s and up to the mid 80s, inner trach cannula changed at this time and no visible mucous plug noted clog the inner cannula off, patient continued to desaturate to the mid 70s to high 80s and suctioned again multiple times for copious amounts of thick pink and tan colored secretions.  Throughout this process patient with significant facial grimacing, extension of extremities, and tears in his eyes despite receiving prn pain medication. Dr. Genia Harold was paged at approximately 1755 and RT called.  RT at bedside and updated RT on all that had been done so far for the patient.  Dr. Genia Harold contacted the floor at approximately 1800 and spoke with this nurse who gave updates, Dr. Genia Harold ordered for patient to be placed back on the ventilator.  RT updated on Dr. Gretta Began order and patient placed back on ventilator by RT.  Patient continues to cough, grimace, extend extremities, and bucking the vent.  Dr. Genia Harold again paged as patient is exhibiting significant pain and fighting the vent. After second page Dr. Genia Harold responded and request for Dr. Genia Harold to come to the floor and personally see the patient was made.  At approximately 1845 Dr. Genia Harold to the floor and concerns regarding patients pain was addressed, respiratory status, and updates given on all measures to this point.  Precidex order placed by Dr. Genia Harold, will  endorse all to oncoming shift.

## 2021-12-01 NOTE — Progress Notes (Signed)
This morning patient was notably agitated by swatting at staff during care and restless to the point of attempting to pull out trach.  Reached out to Dr. Genia Harold who ordered a one time dose of ativan and 1:1 sitter; patient was also given pain medication at this time as well.  Patient notable uncomfortable and restless despite medications and noted to have significant grimacing to the face, as well as tears coming from his eyes. Updated Dr. Genia Harold regarding need for pain medication for this patient, Dr. Genia Harold advised to stay with the prn IV pain medication at this time.  Patient as required prn pain medication Q2H with little relief of pain before the next dose could be given.  Dr. Genia Harold aware and to speak with son regarding goals of care.  Son in and Dr. Genia Harold at bedside speaking with son, will continue to monitor patient.

## 2021-12-01 NOTE — Progress Notes (Signed)
Palliative Care Progress Note, Assessment & Plan   Patient Name: Jordan Hayden       Date: 12/01/2021 DOB: 1965/12/26  Age: 56 y.o. MRN#: [id number] Attending Physician: Armando Reichert, MD Primary Care Physician: Axle Body, MD Admit Date: 12/02/2021  Reason for Consultation/Follow-up: Establishing goals of care  Subjective: Patient is lying in bed in no apparent distress.  He is unable to acknowledge my presence or make his wishes known.  His eyes are rolled back towards the back of his head.  He will turn his head left and right with physical touch.  His Sister Jordan Hayden is at bedside.  HPI: 56 y.o. male  with past medical history of HTN, OSA, morbid obesity, VitD deficiency, GERD, and advanced alcoholic cirrhosis of the liver admitted on 11/18/2021 with c/o coughing/spitting up blood.   CT revealed right sided supraglottic neck mass with narrowing of airway concerning for malignancy. Trach placed 11/17. 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  PMT was consulted to discuss [place].   Summary of counseling/coordination of care: After reviewing the patient's chart and assessing the patient at bedside, I spoke with Jordan Hayden in regards to goals of care.  Jordan Hayden says she understands that he is not well.  However, she also shares that son is the Media planner.  We discussed that patient has exhibited signs of pain but that he is not suffering at the moment.  Reviewed that CCM's recommendations is to engage with hospice for end-of-life care.  However, Jordan Hayden defers all decision-making to patient's son.  After visiting with patient and Jordan Hayden, I attempted to speak with patient's son over the phone.  No answer.  HIPAA compliant voicemail left.   Of note, I have now attempted  to speak with patient's son three separate days/times with no answer and three HIPAA complaints voicemails left.   I counseled with CCM Dr. Genia Harold.  He has been leading goals of care discussions and has built trust and rapport with patient's son.  He will continue [place] discussions throughout the weekend and has asked for PMT to stay involved to help with transition to hospice if/when family decides to shift towards comfort.  PMT will remain the patient and family throughout his hospitalization.  Please note that there is no in person PMT provider at [facility] over the weekend.  Please contact PMT provider via amion over the weekend if palliative needs arise.  Physical Exam Vitals reviewed.  Constitutional:      General: He is not in acute distress.    Appearance: He is ill-appearing.  HENT:     Head: Normocephalic.     Mouth/Throat:     Comments: trach Cardiovascular:     Rate and Rhythm: Normal rate.     Pulses: Normal pulses.  Pulmonary:     Effort: Pulmonary effort is normal.  Abdominal:     Palpations: Abdomen is soft.  Musculoskeletal:     Comments: Does not MAETC  Skin:    General: Skin is warm and dry.        Palliative Assessment/Data: 10%    Total Time 25 minutes  Greater than 50%  of this time was spent counseling and coordinating care related to  the above assessment and plan.  Thank you for allowing the Palliative Medicine Team to assist in the care of this patient.  [place] Ilsa Iha, FNP-BC Palliative Medicine Team Team Phone # [phone]

## 2021-12-01 NOTE — Progress Notes (Signed)
Patient severely agitated, combative, and attempting to pull out trach; Dr. Genia Harold notified and came to bedside, verbal order received for one time dose of Ativan '1mg'$  IV.

## 2021-12-01 NOTE — Plan of Care (Signed)
Continuing with plan of care. 

## 2021-12-01 NOTE — TOC Progression Note (Signed)
Transition of Care [facility]) - Progression Note    Patient Details  Name: Jordan Hayden MRN: [id number] Date of Birth: August 12, 1965  [facility] CM/SW Contact  Shelbie Hutching, RN Phone Number: 12/01/2021, 1:49 PM  Clinical Narrative:     TOC continues to follow.  Patient is on trach collar but remains agitated.  Critical care MD is recommending hospice care.    Expected Discharge Plan:  (TBD) Barriers to Discharge: Continued Medical Work up  Expected Discharge Plan and Services Expected Discharge Plan:  (TBD)   Discharge Planning Services: CM Consult   Living arrangements for the past 2 months: Single Family Home                                       Social Determinants of Health (SDOH) Interventions    Readmission Risk Interventions     No data to display

## 2021-12-02 DIAGNOSIS — D696 Thrombocytopenia, unspecified: Secondary | ICD-10-CM | POA: Diagnosis not present

## 2021-12-02 DIAGNOSIS — K7460 Unspecified cirrhosis of liver: Secondary | ICD-10-CM | POA: Diagnosis not present

## 2021-12-02 DIAGNOSIS — N179 Acute kidney failure, unspecified: Secondary | ICD-10-CM | POA: Diagnosis not present

## 2021-12-02 DIAGNOSIS — J9601 Acute respiratory failure with hypoxia: Secondary | ICD-10-CM | POA: Diagnosis not present

## 2021-12-02 DIAGNOSIS — R221 Localized swelling, mass and lump, neck: Secondary | ICD-10-CM | POA: Diagnosis not present

## 2021-12-02 LAB — CBC WITH DIFFERENTIAL/PLATELET
Abs Immature Granulocytes: 0.04 10*3/uL (ref 0.00–0.07)
Abs Immature Granulocytes: 0.04 10*3/uL (ref 0.00–0.07)
Basophils Absolute: 0 10*3/uL (ref 0.0–0.1)
Basophils Absolute: 0 10*3/uL (ref 0.0–0.1)
Basophils Relative: 0 %
Basophils Relative: 0 %
Eosinophils Absolute: 0.1 10*3/uL (ref 0.0–0.5)
Eosinophils Absolute: 0.1 10*3/uL (ref 0.0–0.5)
Eosinophils Relative: 2 %
Eosinophils Relative: 2 %
HCT: 30.8 % — ABNORMAL LOW (ref 39.0–52.0)
HCT: 24.7 % — ABNORMAL LOW (ref 39.0–52.0)
Hemoglobin: 9.9 g/dL — ABNORMAL LOW (ref 13.0–17.0)
Hemoglobin: 8.1 g/dL — ABNORMAL LOW (ref 13.0–17.0)
Immature Granulocytes: 1 %
Immature Granulocytes: 1 %
Lymphocytes Relative: 26 %
Lymphocytes Relative: 21 %
Lymphs Abs: 1.1 10*3/uL (ref 0.7–4.0)
Lymphs Abs: 0.9 10*3/uL (ref 0.7–4.0)
MCH: 33.9 pg (ref 26.0–34.0)
MCH: 34.5 pg — ABNORMAL HIGH (ref 26.0–34.0)
MCHC: 32.1 g/dL (ref 30.0–36.0)
MCHC: 32.8 g/dL (ref 30.0–36.0)
MCV: 105.5 fL — ABNORMAL HIGH (ref 80.0–100.0)
MCV: 105.1 fL — ABNORMAL HIGH (ref 80.0–100.0)
Monocytes Absolute: 0.3 10*3/uL (ref 0.1–1.0)
Monocytes Absolute: 0.3 10*3/uL (ref 0.1–1.0)
Monocytes Relative: 6 %
Monocytes Relative: 6 %
Neutro Abs: 2.9 10*3/uL (ref 1.7–7.7)
Neutro Abs: 3.2 10*3/uL (ref 1.7–7.7)
Neutrophils Relative %: 65 %
Neutrophils Relative %: 70 %
Platelets: 24 10*3/uL — CL (ref 150–400)
Platelets: 26 10*3/uL — CL (ref 150–400)
RBC: 2.92 MIL/uL — ABNORMAL LOW (ref 4.22–5.81)
RBC: 2.35 MIL/uL — ABNORMAL LOW (ref 4.22–5.81)
RDW: 18.6 % — ABNORMAL HIGH (ref 11.5–15.5)
RDW: 19.2 % — ABNORMAL HIGH (ref 11.5–15.5)
Smear Review: NORMAL
WBC: 4.4 10*3/uL (ref 4.0–10.5)
WBC: 4.5 10*3/uL (ref 4.0–10.5)
nRBC: 1.4 % — ABNORMAL HIGH (ref 0.0–0.2)
nRBC: 0.4 % — ABNORMAL HIGH (ref 0.0–0.2)

## 2021-12-02 LAB — APTT: aPTT: 47 seconds — ABNORMAL HIGH (ref 24–36)

## 2021-12-02 LAB — GLUCOSE, CAPILLARY
Glucose-Capillary: 82 mg/dL (ref 70–99)
Glucose-Capillary: 83 mg/dL (ref 70–99)
Glucose-Capillary: 83 mg/dL (ref 70–99)
Glucose-Capillary: 84 mg/dL (ref 70–99)
Glucose-Capillary: 92 mg/dL (ref 70–99)

## 2021-12-02 LAB — BASIC METABOLIC PANEL
Anion gap: 8 (ref 5–15)
BUN: 11 mg/dL (ref 6–20)
CO2: 26 mmol/L (ref 22–32)
Calcium: 9.6 mg/dL (ref 8.9–10.3)
Chloride: 112 mmol/L — ABNORMAL HIGH (ref 98–111)
Creatinine, Ser: <0.3 mg/dL — ABNORMAL LOW (ref 0.61–1.24)
Glucose, Bld: 83 mg/dL (ref 70–99)
Potassium: 4.2 mmol/L (ref 3.5–5.1)
Sodium: 146 mmol/L — ABNORMAL HIGH (ref 135–145)

## 2021-12-02 LAB — MAGNESIUM: Magnesium: 2.2 mg/dL (ref 1.7–2.4)

## 2021-12-02 LAB — PROTIME-INR
INR: 1.7 — ABNORMAL HIGH (ref 0.8–1.2)
Prothrombin Time: 19.9 seconds — ABNORMAL HIGH (ref 11.4–15.2)

## 2021-12-02 LAB — PHOSPHORUS: Phosphorus: 1.8 mg/dL — ABNORMAL LOW (ref 2.5–4.6)

## 2021-12-02 LAB — FIBRINOGEN
Fibrinogen: 193 mg/dL — ABNORMAL LOW (ref 210–475)
Fibrinogen: 203 mg/dL — ABNORMAL LOW (ref 210–475)

## 2021-12-02 MED ORDER — HALOPERIDOL LACTATE 5 MG/ML IJ SOLN
2.5000 mg | INTRAMUSCULAR | Status: DC | PRN
  Administered 2021-12-03 (×3): 5 mg via INTRAVENOUS
  Filled 2021-12-02 (×3): qty 1

## 2021-12-02 MED ORDER — SODIUM CHLORIDE 0.9% IV SOLUTION: Freq: Once | INTRAVENOUS | Status: AC

## 2021-12-02 MED ORDER — GLYCOPYRROLATE 0.2 MG/ML IJ SOLN
0.2000 mg | INTRAMUSCULAR | Status: DC | PRN
  Administered 2021-12-03 (×3): 0.2 mg via INTRAVENOUS
  Filled 2021-12-02 (×3): qty 1

## 2021-12-02 MED ORDER — LIDOCAINE 5 % EX PTCH
1.0000 | MEDICATED_PATCH | CUTANEOUS | Status: DC
  Administered 2021-12-02: 1 via TRANSDERMAL
  Filled 2021-12-02 (×2): qty 1

## 2021-12-02 MED ORDER — POLYVINYL ALCOHOL 1.4 % OP SOLN: 1.0000 [drp] | Freq: Four times a day (QID) | OPHTHALMIC | Status: DC | PRN

## 2021-12-02 MED ORDER — MORPHINE 100MG IN NS 100ML (1MG/ML) PREMIX INFUSION
0.0000 mg/h | INTRAVENOUS | Status: DC
  Administered 2021-12-02: 5 mg/h via INTRAVENOUS
  Administered 2021-12-03: 10 mg/h via INTRAVENOUS
  Administered 2021-12-03 (×2): 20 mg/h via INTRAVENOUS
  Administered 2021-12-03: 15 mg/h via INTRAVENOUS
  Filled 2021-12-02 (×4): qty 100

## 2021-12-02 MED ORDER — LACTULOSE ENEMA
300.0000 mL | Freq: Two times a day (BID) | ORAL | Status: DC
  Filled 2021-12-02: qty 300

## 2021-12-02 MED ORDER — SODIUM CHLORIDE 0.9% FLUSH: 10.0000 mL | INTRAVENOUS | Status: DC | PRN

## 2021-12-02 MED ORDER — SODIUM CHLORIDE 0.9 % IV SOLN: INTRAVENOUS | Status: DC

## 2021-12-02 MED ORDER — MORPHINE BOLUS VIA INFUSION
5.0000 mg | INTRAVENOUS | Status: DC | PRN
  Administered 2021-12-03 (×5): 5 mg via INTRAVENOUS

## 2021-12-02 MED ORDER — LORAZEPAM 2 MG/ML IJ SOLN
2.0000 mg | INTRAMUSCULAR | Status: DC | PRN
  Administered 2021-12-03 (×3): 4 mg via INTRAVENOUS
  Filled 2021-12-02 (×3): qty 2

## 2021-12-02 MED ORDER — ALBUTEROL SULFATE (2.5 MG/3ML) 0.083% IN NEBU: 2.5000 mg | INHALATION_SOLUTION | RESPIRATORY_TRACT | Status: DC | PRN

## 2021-12-02 MED ORDER — POTASSIUM PHOSPHATES 15 MMOLE/5ML IV SOLN
30.0000 mmol | Freq: Once | INTRAVENOUS | Status: AC
  Administered 2021-12-02: 30 mmol via INTRAVENOUS
  Filled 2021-12-02: qty 10

## 2021-12-02 MED ORDER — GLYCOPYRROLATE 1 MG PO TABS: 1.0000 mg | ORAL_TABLET | ORAL | Status: DC | PRN

## 2021-12-02 MED ORDER — GLYCOPYRROLATE 0.2 MG/ML IJ SOLN: 0.2000 mg | INTRAMUSCULAR | Status: DC | PRN

## 2021-12-02 NOTE — Progress Notes (Signed)
Progress Note   Called to bedside by RN due to acute bleeding at tracheostomy site.  Upon arrival at bedside pt with small to moderate amount of bright red blood from trach site.  Pt hemodynamically stable.  Orders placed to administer 1 unit of FFP/1 pool of Cryo/1 unit of platelets.  Will repeat labs 1 hour post transfusion.  Donell Beers, [place] Pager [phone] (please enter 7 digits) PCCM Consult Pager [phone] (please enter 7 digits)

## 2021-12-02 NOTE — Progress Notes (Signed)
NAME:  Jordan Hayden, MRN:  [id number], DOB:  1966-01-06, LOS: 9 ADMISSION DATE:  11/17/2021, CHIEF COMPLAINT:  Respiratory Failure  History of Present Illness:   56 year old male non-smoker with history of morbid obesity, and decompensated alcoholic cirrhosis(esophageal varices and hepatic encephalopathy) who presented to the hospital after an episode of hematemesis and worsening dysphagia.  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 There was also concern for malignant adenopathy in the neck with compression of the right internal jugular vein. He was evaluated by ENT and underwent urgent tracheostomy on 12/02/2021 for impending airway closure. GI was also consulted for concern for hematemesis in the setting of advanced cirrhosis. He was initially on PPI & Octreotide gtt, transitioned to PPI only.  Following tracheostomy, he's continued to have bleeding around the tracheostomy site and has received platelets, Vitamin K, FFP's and cryoprecipitate. ENT continues to follow his tracheostomy site for bleeding (which is currently packed). He continues to be coagulopathic secondary to his liver disease and received cryoprecipitate this AM.  Significant Hospital Events: Including procedures, antibiotic start and stop dates in addition to other pertinent events   11/10/2021: Admitted 11/17/2021: Tracheostomy placed by ENT. Aline and CVC placed 11/27/2021: Pt received 3 pools of cryoprecipitate due to fibrinogen level of 135  11/28/2021: Pts mentation slowly improving able to open eyes to voice; attempted SBT however pt failed placed back on full support FiO2 28%/PEEP 5  11/25: Pt tolerating periods of trach collar   Interim History / Subjective:  As outlined above   Objective   Blood pressure 124/61, pulse 94,  temperature 98.2 F (36.8 C), temperature source Axillary, resp. rate 20, height '5\' 8"'$  (1.727 m), weight 135.2 kg, SpO2 100 %.    Vent Mode: PSV FiO2 (%):  [30 %-50 %] 50 % Set Rate:  [16 bmp] 16 bmp Vt Set:  [500 mL] 500 mL PEEP:  [2 cmH20-5 cmH20] 2 cmH20 Pressure Support:  [2 cmH20-5 cmH20] 2 cmH20   Intake/Output Summary (Last 24 hours) at 12/02/2021 1048 Last data filed at 12/02/2021 3785 Gross per 24 hour  Intake 2296.89 ml  Output 1890 ml  Net 406.89 ml   Filed Weights   11/30/21 0500 12/01/21 0500 12/02/21 0500  Weight: 135.6 kg 135.6 kg 135.2 kg    Examination: General: Acute on chronically-ill appearing male, NAD on mechanical ventilation via trach  HENT: Atraumatic, normocephalic, right neck mass, size 8 mm trach midline no signs of active bleeding  Lungs: Faint rhonchi, even, non labored  Cardiovascular: NSR, no r/g, 2+ radial/1+ distal pulses, 2+ bilateral lower extremity pitting edema  Abdomen: +BS x4, obese, soft, non distended  Extremities: Normal tone  Neuro: Opens eyes to voice and able to follow commands, PERRL GU: Indwelling foley catheter draining yellow urine   Assessment & Plan:  56 year old male with history of decompensated liver cirrhosis presents with hemoptysis/hematemesis and dysphagia, found to have a large supraglotic neck mass compromising the airway and requiring emergent tracheostomy tube placement.  Hepatic encephalopathy~improving   - Lactulose dose decreased  - Avoid sedating medication when able   Acute Hypoxic Respiratory Failure s/p tracheostomy in the setting of a supraglottic mass with impending respiratory failure  - Trach collar as tolerated  - Continue lung protective strategies - Continue prn bronchodilator therapy  -  ENT consulted appreciate input  - Maintain RASS goal of 0 - Prn fentanyl for pain management   Hemorrhagic Shock - resolved - Continuous telemetry monitoring  - Maintain map >65  Decompensated Liver  Cirrhosis Esophageal Varices Unable to place NG or OG tube given supraglottic tumor, will have to consider PEG if in line with goals of care - GI consulted appreciate input  - Continue protonix q12hrs   AKI-resolved Hypernatremia  Hypophosphatemia  - Trend BMP  - Replace electrolytes as indicated  - Monitor UOP - Continue D5WLR   Thrombocytopenia, Coagulopathy, and 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  - Trend CBC, coags, and fibrinogen levels  - Monitor for s/sx of bleeding  - Maintain fibrinogen level above 150, hgb >7, and platelet count 20,000 - Hematology consulted appreciate input  Type 2 Diabetes - Continue SSI  - CBG's q4hrs   Best Practice (right click and "Reselect all SmartList Selections" daily)   Diet/type: NPO DVT prophylaxis: SCD's  GI prophylaxis: PPI Lines: N/A Foley:  Yes, and it is still needed and removal ordered  Code Status:  full code Last date of multidisciplinary goals of care discussion [12/02/2021]  Labs   CBC: Recent Labs  Lab 11/28/21 2208 11/29/21 0552 11/30/21 0421 12/01/21 0314 12/02/21 0755  WBC 4.7 4.7 5.2 5.4 4.4  NEUTROABS 3.5 3.6 3.7 3.7 2.9  HGB 8.3* 8.5* 8.7* 9.0* 9.9*  HCT 25.5* 25.9* 27.1* 27.6* 30.8*  MCV 104.5* 104.0* 104.6* 104.9* 105.5*  PLT 43* 38* 34* 31* 24*    Basic Metabolic Panel: Recent Labs  Lab 11/28/21 0314 11/29/21 0552 11/29/21 2203 11/30/21 0421 11/30/21 1901 12/01/21 0314 12/02/21 0755  NA 151* 144  --  146* 148* 148* 146*  K 3.2* 3.4*  --  3.8 4.7 4.0 4.2  CL 114* 111  --  112* 113* 113* 112*  CO2 31 30  --  '28 28 29 26  '$ GLUCOSE 136* 119*  --  92 81 92 83  BUN 30* 23*  --  '19 17 16 11  '$ CREATININE 0.73 0.58*  --  0.57* 0.32* 0.41* <0.30*  CALCIUM 10.5* 10.0  --  9.9 9.4 9.8 9.6  MG 2.7* 2.4  --  2.3  --  2.3 2.2  PHOS 1.7* 1.3* 2.3* 2.1*  --  1.5* 1.8*   GFR: CrCl cannot be calculated (This lab value cannot be used to calculate CrCl because it is not a number: <0.30). Recent Labs   Lab 11/29/21 0552 11/30/21 0421 12/01/21 0314 12/02/21 0755  WBC 4.7 5.2 5.4 4.4    Liver Function Tests: Recent Labs  Lab 12/01/21 0314  AST 58*  ALT 34  ALKPHOS 79  BILITOT 11.1*  PROT 5.9*  ALBUMIN 3.0*   No results for input(s): "LIPASE", "AMYLASE" in the last 168 hours. No results for input(s): "AMMONIA" in the last 168 hours.  ABG    Component Value Date/Time   PHART 7.45 11/13/2021 1549   PCO2ART 43 11/20/2021 1549   PO2ART 101 12/06/2021 1549   HCO3 29.9 (H) 11/26/2021 1549   O2SAT 99.2 11/21/2021 1549     Coagulation Profile: Recent Labs  Lab 11/26/21 0417 11/27/21 0445 11/28/21 0314 11/29/21 0552 11/30/21 0421  INR 2.2* 1.9* 1.8* 1.8* 1.9*    Cardiac Enzymes: No results for input(s): "CKTOTAL", "CKMB", "CKMBINDEX", "TROPONINI" in the last 168 hours.  HbA1C: Hgb A1c MFr Bld  Date/Time Value Ref Range Status  12/07/2021 07:59 PM 4.4 (L) 4.8 - 5.6 % Final  Comment:    (NOTE)         Prediabetes: 5.7 - 6.4         Diabetes: >6.4         Glycemic control for adults with diabetes: <7.0     CBG: Recent Labs  Lab 12/01/21 1233 12/01/21 1715 12/01/21 1912 12/01/21 2322 12/02/21 0738  GLUCAP 92 91 77 77 82    Review of Systems:   Unable to obtain  Past Medical History:  He,  has a past medical history of Alcohol abuse, Alcoholic cirrhosis ([place]), Alcoholism ([place]), Arthritis, and Cirrhosis ([place]).   Surgical History:   Past Surgical History:  Procedure Laterality Date   DIRECT LARYNGOSCOPY  12/01/2021   Procedure: DIRECT LARYNGOSCOPY WITH BIOPSIES;  Surgeon: Carloyn Manner, MD;  Location: [facility] ORS;  Service: ENT;;   ESOPHAGOGASTRODUODENOSCOPY (EGD) WITH PROPOFOL N/A 07/01/2017   Procedure: ESOPHAGOGASTRODUODENOSCOPY (EGD) WITH PROPOFOL;  Surgeon: Lucilla Lame, MD;  [facility] END[facility]COPY;  Service: Endoscopy;  Laterality: N/A;   TRACHEOSTOMY TUBE PLACEMENT N/A 11/22/2021   Procedure: AWAKE TRACHEOSTOMY;  Surgeon: Carloyn Manner, MD;  Location: [facility] ORS;  Service: ENT;  Laterality: N/A;     Social History:   reports that he has never smoked. He has never used smokeless tobacco. He reports that he does not drink alcohol and does not use drugs.   Family History:  His family history includes Cervical cancer in his sister; Cirrhosis in his brother; Diabetes in his father; Hypertension in his mother.   Allergies Allergies  Allergen Reactions   Hydrocodone-Acetaminophen Swelling    Mouth Swelling    Penicillins Swelling     Home Medications  Prior to Admission medications   Medication Sig Start Date End Date Taking? Authorizing Provider  chlorhexidine (PERIDEX) 0.12 % solution Use as directed 5 mLs in the mouth or throat 2 (two) times daily. 11/16/21  Yes [provider]  albuterol (VENTOLIN HFA) 108 (90 Base) MCG/ACT inhaler Inhale into the lungs. Patient not taking: Reported on 11/10/2021 02/02/21 02/02/22  [provider]  aspirin EC 81 MG tablet Take 81 mg by mouth daily. Patient not taking: Reported on 07/25/2021 03/05/16   [provider]  doxycycline (VIBRAMYCIN) 100 MG capsule Take 100 mg by mouth 2 (two) times daily. Patient not taking: Reported on 11/28/2021 07/19/21   [provider]  ferrous sulfate 325 (65 FE) MG tablet Take 325 mg by mouth every morning. 06/05/20   [provider]  furosemide (LASIX) 20 MG tablet Take 1 tablet (20 mg total) by mouth daily. 07/02/17 08/31/20  Salary, Holly Bodily D, MD  hydrOXYzine (ATARAX/VISTARIL) 25 MG tablet Take 25 mg by mouth 3 (three) times daily as needed. 04/01/17   [provider]  lactulose (CHRONULAC) 10 GM/15ML solution Take 30 mLs (20 g total) by mouth 3 (three) times daily. 08/31/20   Ezekiel Slocumb, DO  meloxicam (MOBIC) 15 MG tablet Take 1 tablet (15 mg total) by mouth daily. 07/22/18   Cuthriell, Charline Bills, PA-C  pantoprazole (PROTONIX) 40 MG tablet Take 1 tablet (40 mg total) by mouth daily. 07/02/17  08/31/20  Salary, Avel Peace, MD  potassium chloride SA (K-DUR,KLOR-CON) 20 MEQ tablet Take 2 tablets (40 mEq total) by mouth 2 (two) times daily. 07/02/17   Salary, Avel Peace, MD  spironolactone (ALDACTONE) 25 MG tablet Take 0.5 tablets (12.5 mg total) by mouth 2 (two) times daily. 07/02/17   Salary, Avel Peace, MD  traMADol (ULTRAM) 50 MG tablet Take 50 mg  by mouth every 6 (six) hours as needed for moderate pain. Patient not taking: Reported on 11/11/2021 11/16/21   [provider]  XIFAXAN 550 MG TABS tablet Take 550 mg by mouth 2 (two) times daily. 08/08/20   [provider]     Critical care time: 42 minutes    Donell Beers, [place] Pager [phone] (please enter 7 digits) PCCM Consult Pager [phone] (please enter 7 digits)

## 2021-12-02 NOTE — Plan of Care (Signed)
Continuing with plan of care. 

## 2021-12-02 NOTE — Progress Notes (Signed)
Patient has had an eventful day. Received critical lab value of platelet level 24 and notified Dr. Genia Harold, no new orders at that time, will continue to monitor patient. Early in the shift patient was switched over from ventilator to trach collar, before this occurrence patient was exhibiting symptoms of pain and due prn pain medication. Concern brought to Dr. Genia Harold regarding need for prn pain medication and switching patient to trach color, advised to hold off on the prn pain medication as the goal was to remove patient off the vent despite signs of discomfort and pain; reinforced concern for patients discomfort and pain, then advised if patient was in severe pain then could administer prn pain medication. After transition to trach collar patient continued to require prn pain medication. At approximately 1100 patient was noted to have small amount of fresh blood coming out of inner trach, NP notified and instructed to monitor at this time.  At [place] patient's niece reached out to this nurse stating the patient was bleeding from his trach.  This RN immediately went to bedside and small to moderate amount of bright red blood was coming out of the inner and outer trach.  Dr. Genia Harold and NP notified and came to bedside.  Dr. Genia Harold ordered to have patient placed back on ventilator and further orders included to administer platelets, cryo, and FFP.  RT at bedside and switched patient back to vent. IV team called as patient had no visible viable veins for second PIV placement.  IV team was able to place a mid-line to the left upper arm and patient tolerated well.  Patient also received all platelets, cryo, and FFP all of which he tolerated well. At 1800, this nurse offered to give patient CHG bath and change his gown, at this time family at bedside decline and appreciated for patient to be able to rest and not be disturbed.  Families request respected and endorsed this to oncoming nurse.  Lab also called as stat lab orders  had not been collected, lab came to bedside and collected.  Family is at bedside, updates given to oncoming nurse.

## 2021-12-02 NOTE — Progress Notes (Signed)
Per NP-Ouma, patient to be transitioned to comfort care. Suctioned patient's airway and changed inner cannula. Placed patient on 30% ATC. Patient tolerated RT interventions well. Will continue to monitor

## 2021-12-02 NOTE — Progress Notes (Signed)
OT Cancellation Note  Patient Details Name: Jordan Hayden MRN: [id number] DOB: [birth year]   Cancelled Treatment:    Reason Eval/Treat Not Completed: Medical issues which prohibited therapy. Patient is somnolent, eyes rolled backwards in head, unable to communicate or to respond to any directions or stimuli, unable to  acknowledge therapist's presence. Per RN, this has been pt's condition all day. She requests attempting OT evaluation tomorrow.   Josiah Lobo 12/02/2021, 3:14 PM

## 2021-12-02 NOTE — Progress Notes (Signed)
PHARMACY CONSULT NOTE  Pharmacy Consult for Electrolyte Monitoring and Replacement   Recent Labs: Potassium (mmol/L)  Date Value  12/01/2021 4.0   Magnesium (mg/dL)  Date Value  12/01/2021 2.3   Calcium (mg/dL)  Date Value  12/01/2021 9.8   Albumin (g/dL)  Date Value  12/01/2021 3.0 (L)   Phosphorus (mg/dL)  Date Value  12/01/2021 1.5 (L)   Sodium (mmol/L)  Date Value  12/01/2021 148 (H)   Assessment: 56 year old male with history of advanced alcohol associated liver cirrhosis, alcohol abuse currently in remission, thrombocytopenia, iron deficiency anemia, morbid obesity, who presents to the ED for chief concern of nausea and vomiting blood. Pharmacy is asked to follow and replace electrolytes while in CCU  Patient is currently intubated, sedated, and on mechanical ventilation in the ICU. Patient is NPO.  MIVF: D5LR '@50'$ ,   Goal of Therapy:  Electrolytes within normal limits  Plan:  NPO, w/o ETT. Repletion still IV only. Stable Scr 0.41 to <0.3; Net I/O +9.5L; UOP 0.3-0.4 ml/k/h. K 4>4.2: After 34mq supplied from [facility] 11/24 Will receive same today.   Phos 1.5>1.8: after Kphos 340ml x1 on 11/24 Will repeat Kphos 3054m IV x1  CTM and replace PRN, next with AM labs 11/25 0500 BraLorna Dibble/25/2023 7:48 AM

## 2021-12-02 NOTE — Progress Notes (Signed)
PT Cancellation Note  Patient Details Name: Jordan Hayden MRN: [id number] DOB: July 23, 1965   Cancelled Treatment:    Reason Eval/Treat Not Completed: Patient not medically ready. Pt had trach placement on 11/17, slowly weaned off of mechanical ventilator to tolerating periods on trach collar 11/25, but per Critical Care note from today pt with active bleeding around the trach site. Will hold for today. PT will continue to follow-up with pt acutely as available and appropriate.    [place] 12/02/2021, 2:24 PM

## 2021-12-03 DIAGNOSIS — J9601 Acute respiratory failure with hypoxia: Secondary | ICD-10-CM | POA: Diagnosis not present

## 2021-12-03 DIAGNOSIS — R221 Localized swelling, mass and lump, neck: Secondary | ICD-10-CM | POA: Diagnosis not present

## 2021-12-03 DIAGNOSIS — Z7189 Other specified counseling: Secondary | ICD-10-CM | POA: Diagnosis not present

## 2021-12-03 LAB — PREPARE FRESH FROZEN PLASMA: Unit division: 0

## 2021-12-03 LAB — PREPARE PLATELET PHERESIS: Unit division: 0

## 2021-12-03 LAB — BPAM FFP
Blood Product Expiration Date: 202311302359
ISSUE DATE / TIME: 202311251629
Unit Type and Rh: 7300

## 2021-12-03 LAB — BPAM PLATELET PHERESIS
Blood Product Expiration Date: 202311252359
ISSUE DATE / TIME: 202311251554
Unit Type and Rh: 6200

## 2021-12-03 LAB — BPAM CRYOPRECIPITATE
Blood Product Expiration Date: 202311252157
ISSUE DATE / TIME: 202311251629
Unit Type and Rh: 5100

## 2021-12-03 LAB — PREPARE CRYOPRECIPITATE: Unit division: 0

## 2021-12-03 MED ORDER — HYDROMORPHONE HCL 1 MG/ML IJ SOLN
1.0000 mg | INTRAMUSCULAR | Status: DC | PRN
  Administered 2021-12-03: 1 mg via INTRAVENOUS
  Filled 2021-12-03: qty 1

## 2021-12-03 NOTE — Progress Notes (Signed)
NAME:  Jordan Hayden, MRN:  [id number], DOB:  Sep 04, 1965, LOS: 37 ADMISSION DATE:  12/01/2021, CHIEF COMPLAINT:  Respiratory failure, neck mass   History of Present Illness:  56 year old male non-smoker with history of morbid obesity, and decompensated alcoholic cirrhosis(esophageal varices and hepatic encephalopathy) who presented to the hospital after an episode of hematemesis and worsening dysphagia.   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 There was also concern for malignant adenopathy in the neck with compression of the right internal jugular vein. He was evaluated by ENT and underwent urgent tracheostomy on 11/18/2021 for impending airway closure. GI was also consulted for concern for hematemesis in the setting of advanced cirrhosis. He was initially on PPI & Octreotide gtt, transitioned to PPI only.   Following tracheostomy, he's continued to have bleeding around the tracheostomy site and has received platelets, Vitamin K, FFP's and cryoprecipitate. ENT continues to follow his tracheostomy site for bleeding (which is currently packed). He continues to be coagulopathic secondary to his liver disease.    Significant Hospital Events: Including procedures, antibiotic start and stop dates in addition to other pertinent events   11/08/2021: Admitted 12/07/2021: Tracheostomy placed by ENT. Aline and CVC placed 11/27/2021: Pt received 3 pools of cryoprecipitate due to fibrinogen level of 135  11/28/2021: Pts mentation slowly improving able to open eyes to voice; attempted SBT however pt failed placed back on full support FiO2 28%/PEEP 5  11/25: Pt tolerating periods of trach collar, small amount of bleeding in the evening, back on vent. Son at the bedside at night, transitioned to comfort measures,  pending hospice referrals  Interim History / Subjective:  Patient unresponsive.  Objective   Blood pressure (!) 110/59, pulse 87, temperature 97.7 F (36.5 C), temperature source Axillary, resp. rate (!) 4, height '5\' 8"'$  (1.727 m), weight 135.2 kg, SpO2 95 %.    Vent Mode: PSV FiO2 (%):  [20 %-50 %] 30 % PEEP:  [5 cmH20] 5 cmH20 Pressure Support:  [5 cmH20] 5 cmH20   Intake/Output Summary (Last 24 hours) at 12/05/2021 0709 Last data filed at 12/02/2021 1922 Gross per 24 hour  Intake 2758.48 ml  Output 575 ml  Net 2183.48 ml   Filed Weights   11/30/21 0500 12/01/21 0500 12/02/21 0500  Weight: 135.6 kg 135.6 kg 135.2 kg    Examination: Physical Exam Constitutional:      General: He is not in acute distress.    Appearance: He is obese. He is ill-appearing.  Neck:     Comments: Tracheostomy tube in place Cardiovascular:     Rate and Rhythm: Normal rate and regular rhythm.     Heart sounds: Normal heart sounds.  Pulmonary:     Breath sounds: Rales present.  Abdominal:     General: There is distension.  Neurological:     Mental Status: He is disoriented.     Comments: awake and moving all extremities without purpose.      Assessment & Plan:   56 year old male with history of decompensated liver cirrhosis presents with hemoptysis/hematemesis and dysphagia, found to have a large supraglotic neck mass compromising the airway and requiring emergent tracheostomy tube placement.   Neurology #Toxic Metabolic Encephalopathy #Sedation   Encephalopathy in the setting of sedation and hepatic encephalopathy. Rectal lactulose initiated for management  of hepatic encephalopathy and uptitrated to bid. Treating pain with PRN fentanyl. Goals of care conversations with son and the patient is DNR with comfort measures.  -comfort measures   Respiratory #Acute Hypoxic Respiratory Failure #Supraglottic tumor   S/p tracheostomy in the setting of a supraglottic mass with impending  respiratory failure. Weaned off the vent and is on trach collar  -frequent suctioning for comfort measures   Cardiovascular #Hemorrhagic Shock - resolved   Hemodynamically stable with MAP's in the 70's and not requiring any vasopressor support.   -comfort measures   GI #Decompensated Liver Cirrhosis #Esophageal Varices   On rectal lactulose for the management of hepatic encephalopathy. Does have a liver lesion that is concerning for metastatic malignancy vs HCC, albeit high risk for biopsy given his coagulopathy. Not a liver transplant candidate given malignancy. Unable to place NG or OG tube given supraglottic tumor. Goals of care conversation with transition to [place]. No further procedural interventions.  -d/c lactulose   Renal #AKI - resolving #Hypernatremia   Comfort measures only.   Endo #Type 2 Diabetes   Comfort measures only.   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 Has received blood products and cryoprecipitate with improvement in fibrinogen. Neck mass biopsy shows advanced poorly differentiated squamous cell carcinoma. Unfortunately, given functional status and cirrhosis, patient is a poor candidate for therapy. Goals of care conversations initiated with family and they are aware of the gravity of his situation. He is transitioning to comfort measures only and will initiate referral to hospice.   Infectious Diseases   Received Ceftriaxone for SBP prophylaxis. discontinued.  Best Practice (right click and "Reselect all SmartList Selections" daily)   Diet/type: NPO DVT prophylaxis: not indicated (bleeding, comfort measures) GI prophylaxis: PPI Lines: N/A Foley:  N/A Code Status:  DNR, Comfort measures Last date of multidisciplinary goals of care discussion [11/10/2021]  Labs   CBC: Recent Labs  Lab  11/29/21 0552 11/30/21 0421 12/01/21 0314 12/02/21 0755 12/02/21 1919  WBC 4.7 5.2 5.4 4.4 4.5  NEUTROABS 3.6 3.7 3.7 2.9 3.2  HGB 8.5* 8.7* 9.0* 9.9* 8.1*  HCT 25.9* 27.1* 27.6* 30.8* 24.7*  MCV 104.0* 104.6* 104.9* 105.5* 105.1*  PLT 38* 34* 31* 24* 26*    Basic Metabolic Panel: Recent Labs  Lab 11/28/21 0314 11/29/21 0552 11/29/21 2203 11/30/21 0421 11/30/21 1901 12/01/21 0314 12/02/21 0755  NA 151* 144  --  146* 148* 148* 146*  K 3.2* 3.4*  --  3.8 4.7 4.0 4.2  CL 114* 111  --  112* 113* 113* 112*  CO2 31 30  --  '28 28 29 26  '$ GLUCOSE 136* 119*  --  92 81 92 83  BUN 30* 23*  --  '19 17 16 11  '$ CREATININE 0.73 0.58*  --  0.57* 0.32* 0.41* <0.30*  CALCIUM 10.5* 10.0  --  9.9 9.4 9.8 9.6  MG 2.7* 2.4  --  2.3  --  2.3 2.2  PHOS 1.7* 1.3* 2.3* 2.1*  --  1.5* 1.8*   GFR: CrCl cannot be calculated (This lab value cannot be used to calculate CrCl because it is not a number: <0.30). Recent Labs  Lab 11/30/21 0421 12/01/21 0314 12/02/21 0755 12/02/21 1919  WBC 5.2 5.4 4.4 4.5    Liver Function Tests: Recent Labs  Lab 12/01/21 0314  AST 58*  ALT 34  ALKPHOS 79  BILITOT 11.1*  PROT 5.9*  ALBUMIN 3.0*   No results for input(s): "LIPASE", "AMYLASE" in the last 168 hours. No results for input(s): "AMMONIA" in the last 168 hours.  ABG    Component Value Date/Time   PHART 7.45 11/09/2021 1549   PCO2ART 43 12/02/2021 1549   PO2ART 101 12/06/2021 1549   HCO3 29.9 (H) 11/29/2021 1549   O2SAT 99.2 12/02/2021 1549     Coagulation Profile: Recent Labs  Lab 11/27/21 0445 11/28/21 0314 11/29/21 0552 11/30/21 0421 12/02/21 1919  INR 1.9* 1.8* 1.8* 1.9* 1.7*    Cardiac Enzymes: No results for input(s): "CKTOTAL", "CKMB", "CKMBINDEX", "TROPONINI" in the last 168 hours.  HbA1C: Hgb A1c MFr Bld  Date/Time Value Ref Range Status  11/20/2021 07:59 PM 4.4 (L) 4.8 - 5.6 % Final    Comment:    (NOTE)         Prediabetes: 5.7 - 6.4         Diabetes: >6.4          Glycemic control for adults with diabetes: <7.0     CBG: Recent Labs  Lab 12/02/21 0738 12/02/21 1313 12/02/21 1600 12/02/21 1923 12/02/21 2346  GLUCAP 82 84 92 83 83   Past Medical History:  He,  has a past medical history of Alcohol abuse, Alcoholic cirrhosis ([place]), Alcoholism ([place]), Arthritis, and Cirrhosis ([place]).   Surgical History:   Past Surgical History:  Procedure Laterality Date   DIRECT LARYNGOSCOPY  12/02/2021   Procedure: DIRECT LARYNGOSCOPY WITH BIOPSIES;  Surgeon: Carloyn Manner, MD;  Location: [facility] ORS;  Service: ENT;;   ESOPHAGOGASTRODUODENOSCOPY (EGD) WITH PROPOFOL N/A 07/01/2017   Procedure: ESOPHAGOGASTRODUODENOSCOPY (EGD) WITH PROPOFOL;  Surgeon: Lucilla Lame, MD;  Location: [facility] ENDOSCOPY;  Service: Endoscopy;  Laterality: N/A;   TRACHEOSTOMY TUBE PLACEMENT N/A 11/08/2021   Procedure: AWAKE TRACHEOSTOMY;  Surgeon: Carloyn Manner, MD;  Location: [facility] ORS;  Service: ENT;  Laterality: N/A;     Social History:   reports that he has never smoked. He has never used smokeless tobacco. He reports that he does not drink alcohol and does not use drugs.   Family History:  His family history includes Cervical cancer in his sister; Cirrhosis in his brother; Diabetes in his father; Hypertension in his mother.   Allergies Allergies  Allergen Reactions   Hydrocodone-Acetaminophen Swelling    Mouth Swelling    Penicillins Swelling     Home Medications  Prior to Admission medications   Medication Sig Start Date End Date Taking? Authorizing Provider  chlorhexidine (PERIDEX) 0.12 % solution Use as directed 5 mLs in the mouth or throat 2 (two) times daily. 11/16/21  Yes [provider]  albuterol (VENTOLIN HFA) 108 (90 Base) MCG/ACT inhaler Inhale into the lungs. Patient not taking: Reported on 11/19/2021 02/02/21 02/02/22  [provider]  aspirin EC 81 MG tablet Take 81 mg by mouth daily. Patient not taking: Reported on 07/25/2021 03/05/16    [provider]  doxycycline (VIBRAMYCIN) 100 MG capsule Take 100 mg by mouth 2 (two) times daily. Patient not taking: Reported on 12/02/2021 07/19/21   [provider]  ferrous sulfate 325 (65 FE) MG tablet Take 325 mg by mouth every morning. 06/05/20   [provider]  furosemide (LASIX) 20 MG tablet Take 1 tablet (20 mg total) by mouth daily. 07/02/17 08/31/20  Salary, Holly Bodily D, MD  hydrOXYzine (ATARAX/VISTARIL) 25 MG tablet Take 25 mg by mouth 3 (three) times daily as needed. 04/01/17   [provider]  lactulose (  CHRONULAC) 10 GM/15ML solution Take 30 mLs (20 g total) by mouth 3 (three) times daily. 08/31/20   Ezekiel Slocumb, DO  meloxicam (MOBIC) 15 MG tablet Take 1 tablet (15 mg total) by mouth daily. 07/22/18   Cuthriell, Charline Bills, PA-C  pantoprazole (PROTONIX) 40 MG tablet Take 1 tablet (40 mg total) by mouth daily. 07/02/17 08/31/20  Salary, Avel Peace, MD  potassium chloride SA (K-DUR,KLOR-CON) 20 MEQ tablet Take 2 tablets (40 mEq total) by mouth 2 (two) times daily. 07/02/17   Salary, Avel Peace, MD  spironolactone (ALDACTONE) 25 MG tablet Take 0.5 tablets (12.5 mg total) by mouth 2 (two) times daily. 07/02/17   Salary, Avel Peace, MD  traMADol (ULTRAM) 50 MG tablet Take 50 mg by mouth every 6 (six) hours as needed for moderate pain. Patient not taking: Reported on 11/10/2021 11/16/21   [provider]  XIFAXAN 550 MG TABS tablet Take 550 mg by mouth 2 (two) times daily. 08/08/20   [provider]     Total care time: 62 minutes

## 2021-12-03 NOTE — Progress Notes (Signed)
Per Dr. Genia Harold discontinue normal saline continuous fluid.

## 2021-12-03 NOTE — Progress Notes (Addendum)
Son provided funeral service he would like to utilize-[employer].

## 2021-12-03 NOTE — Progress Notes (Signed)
Continuing to implement comfort measures for patient.

## 2021-12-03 NOTE — Progress Notes (Signed)
OT Cancellation Note  Patient Details Name: Jordan Hayden MRN: [id number] DOB: 1965/09/25   Cancelled Treatment:    Reason Eval/Treat Not Completed: OT screened, no needs identified, will sign off;Other (comment) (Patient transitioned to comfort care). OT will d/c orders at this time.  Waymon Amato, MS, OTR/L   Vania Rea 11/21/2021, 7:58 AM

## 2021-12-03 NOTE — Progress Notes (Signed)
Patient continues on comfort care and comfort care cart requested from dining services, at approximately 1800 patient noted to have increased air hunger after prns given.  Dr. Genia Harold was notified and placed Dialudid prn order.  Family at bedside, report report given to oncoming shift.

## 2021-12-03 NOTE — Progress Notes (Signed)
Patient found to have no respirations, pulse or neurological response. RN pronounced at 2105-04-16. Son, Gabriel Rung, notified and will be coming up to the unit to see his dad.

## 2021-12-03 NOTE — Plan of Care (Signed)
Continuing with comfort measures at this time.

## 2021-12-03 NOTE — IPAL (Signed)
  Interdisciplinary Goals of Care Family Meeting  Interdisciplinary Goals of Care Family Meeting     Date carried out: 12/02/21 Location of the meeting: Bedside   Member's involved: NP and Family Member or next of kin   Durable Power of Attorney or acting medical decision maker: Sons Devariae Johnson   Discussion:  Advance Care Planning/Goals of Care discussion was performed during the course of treatment to decide on type of care right for this patient following change in patient's condition and worsening prognosis.   I met with patient's two sons to discuss goals of care in details following  change in patient's current status. Reviewed patient's worsening lab, ABGs, vital signs including inability to completely liberate from the ventilator  and overall poor prognosis with the family at the bedside and answered all their question.   Discussed prognosis, expected outcome with or without ongoing aggressive treatments and the options for de-escalation of care.   Diagnosis(es): Acute hypoxic hypercapnic respiratory failure secondary to supraglottic mass, decompensated liver cirrhosis w. 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 They have consented and agreed to DNR/DNI and would not wish to pursue any aggressive treatment.  Patient's family would like to proceed with full comfort care including terminal extubation.    Family are satisfied with Plan of action and management. All questions answered     Total Time Spent Face to Face addressing advance care planning in the presence of the Patient: 35 minutes       Rufina Falco, DNP, FNP-C, AGACNP-BC Acute Care Nurse Practitioner [facility] Pager: [phone] Agency at [facility]

## 2021-12-03 NOTE — Plan of Care (Addendum)
Patient is currently laying quietly in bed. His respirations are irregular. Will continue to monitor.  Problem: Education: Goal: Knowledge of General Education information will improve Description: Including pain rating scale, medication(s)/side effects and non-pharmacologic comfort measures Outcome: Not Progressing   Problem: Health Behavior/Discharge Planning: Goal: Ability to manage health-related needs will improve Outcome: Not Progressing

## 2021-12-03 NOTE — Progress Notes (Signed)
Son at bedside. He denies needing anything at the moment. He does not know which funeral service he will be using so was given the AC's number and advised to call them as soon as he knew what services he was going to use.

## 2021-12-03 NOTE — Progress Notes (Signed)
PT Cancellation Note  Patient Details Name: Jordan Hayden MRN: [id number] DOB: 10-07-1965   Cancelled Treatment:    Reason Eval/Treat Not Completed: Medical issues which prohibited therapy (Patient is on comfort care. PT will complete orders at this time.)  Minna Merritts, PT, MPT   Percell Locus 11/25/2021, 9:40 AM

## 2021-12-05 LAB — CULTURE, RESPIRATORY W GRAM STAIN

## 2021-12-07 ENCOUNTER — Encounter: Payer: Self-pay | Admitting: Oncology

## 2021-12-07 LAB — SURGICAL PATHOLOGY

## 2021-12-08 NOTE — Death Summary Note (Signed)
DEATH SUMMARY   Patient Details  Name: Jordan Hayden MRN: [id number] DOB: 1965/08/30  Admission/Discharge Information   Admit Date:  12-14-21  Date of Death: Date of Death: 24-Dec-2021  Time of Death: Time of Death: April 10, 2105  Length of Stay: 2022/04/09  Referring Physician: Langdon Body, MD   Reason(s) for Hospitalization  #Acute Hypoxic Respiratory Failure #Supraglottic tumor S/p tracheostomy in the setting of a supraglottic mass with impending respiratory failure  Diagnoses  Preliminary cause of death: #Acute Hypoxic Respiratory Failure #Supraglottic tumor S/p tracheostomy in the setting of a supraglottic mass with impending respiratory failure Secondary Diagnoses (including complications and co-morbidities):  Principal Problem:   Neck mass Active Problems:   Iron deficiency anemia   Cirrhosis of liver without ascites ([place])   Hyperbilirubinemia   Morbid obesity with BMI of 40.0-44.9, adult (HCC)   Thrombocytopenia (HCC)   Hemoptysis   AKI (acute kidney injury) ([place])   Prolonged INR   UGIB (upper gastrointestinal bleed)   Hypercalcemia   Acute respiratory failure with hypoxia (HCC)   Coagulopathy (HCC)   Goals of care, counseling/discussion   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   The patient presented to the ER on Dec 15, 2022 with vomiting and dysphagia and was found to have a large neck mass on CT in the ED (large mucosal mass in the right supraglottic airway extending and causing narrowing of the airway). There was also concern for malignant adenopathy in the neck  with compression of the right internal jugular vein. He was evaluated by ENT and underwent urgent tracheostomy on 11/28/2021 for impending airway closure. GI was also consulted for concern for hematemesis in the setting of advanced cirrhosis. He was initially on PPI & Octreotide gtt, transitioned to PPI only.   Following tracheostomy, he's continued to have bleeding around the tracheostomy site and has received platelets, Vitamin K, FFP's and cryoprecipitate. ENT continues to follow his tracheostomy site for bleeding (which is currently packed). He continues to be coagulopathic secondary to his liver disease.    Significant Hospital Events: Including procedures, antibiotic start and stop dates in addition to other pertinent events   Dec 14, 2021: Admitted 11/16/2021: Tracheostomy placed by ENT. Aline and CVC placed 11/27/2021: Pt received 3 pools of cryoprecipitate due to fibrinogen level of 135  11/28/2021: Pts mentation slowly improving able to open eyes to voice; attempted SBT however pt failed placed back on full support FiO2 28%/PEEP 5  11/25: Pt tolerating periods of trach collar, small amount of bleeding in the evening, back on vent. Son at the bedside at night, transitioned to comfort measures, pending hospice referrals. See separate note IPAL 11/26:Patient passed away peacefully family at bedside  Pertinent Labs and Studies  Significant Diagnostic Studies US Abdomen Limited RUQ (LIVER/GB)  Result Date: 11/25/2021 CLINICAL DATA:  Cirrhosis. EXAM: ULTRASOUND ABDOMEN LIMITED RIGHT UPPER QUADRANT COMPARISON:  Abdominal ultrasound March 01, 2021 FINDINGS: Gallbladder: Mild sludge in the gallbladder without wall thickening, pericholecystic fluid, or discrete shadowing stone. A Murphy's sign cannot be assessed due to patient sedation. Common bile duct: Diameter: 3.9 mm Liver: Nodular contour. Apparent mass measuring 2.5 x 2.3 x 1.9 cm in the left hepatic lobe not  seen on previous imaging. Portal  vein is patent on color Doppler imaging with normal direction of blood flow towards the liver. Other: None. IMPRESSION: 1. Suspected left hepatic lobe mass measuring up to 2.5 cm. Recommend MRI of the abdomen with and without contrast for better evaluation of this region. 2. Cirrhotic liver. 3. Mild sludge in the gallbladder. Electronically Signed   By: Dorise Bullion III M.D.   On: 11/25/2021 11:32   CT Soft Tissue Neck W Contrast  Result Date: 11/19/2021 CLINICAL DATA:  Neck mass EXAM: CT NECK WITH CONTRAST TECHNIQUE: Multidetector CT imaging of the neck was performed using the standard protocol following the bolus administration of intravenous contrast. RADIATION DOSE REDUCTION: This exam was performed according to the departmental dose-optimization program which includes automated exposure control, adjustment of the mA and/or kV according to patient size and/or use of iterative reconstruction technique. CONTRAST:  64m OMNIPAQUE IOHEXOL 300 MG/ML  SOLN COMPARISON:  None Available. FINDINGS: Pharynx and larynx: Mucosal mass involving the right supraglottic airway extending into the airway and causing significant airway narrowing. Mass is irregular and extends down to the vocal cords and could involve the right vocal cord. Epicenter is above the vocal cord. Mass extends up to the right lateral oropharynx. Salivary glands: No inflammation, mass, or stone. Right submandibular gland surrounded by malignant lymph nodes. Thyroid: Negative Lymph nodes: Malignant adenopathy in neck bilaterally right greater than left. Large conglomeration of lymph nodes throughout the right neck, many of which have necrosis. Right submandibular necrotic nodes measuring 15 mm and 14 mm. Right level 2 lymph nodes 29 mm and 20 mm. Right level 3 lymph nodes 15 mm and 21 mm and 17 mm. Right level 4 lymph nodes 17 mm and 28 mm. Submental lymph node to the right of midline 17 mm. Small necrotic left level 2 lymph node 9 mm. Posterior  lymph nodes on the left measuring 14 mm and 13 mm. Additional smaller lymph nodes on the left. Vascular: Right jugular vein is compressed by the large lymph node mass on the right. No thrombus. Left jugular vein narrowed but patent. Limited intracranial: Negative Visualized orbits: Negative Mastoids and visualized paranasal sinuses: Mucosal edema paranasal sinuses. No air-fluid level. Mastoid clear. Skeleton: No acute abnormality. Upper chest: 5 mm nodule in the superior segment of the right lower lobe. 3 mm nodule left upper lobe. Other: None IMPRESSION: 1. 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 Mass extends down to the vocal cords and could involve the right vocal cord. Mass extends up to the right lateral oropharynx. 2. Malignant adenopathy in the neck bilaterally right greater than left. Largest lymph node mass is in the right neck measuring 29 mm and 20 mm. 3. 5 mm nodule superior segment right lower lobe and 3 mm nodule left upper lobe. Metastatic disease is possible. Chest CT recommended. 4. Right jugular vein is compressed by the large lymph node mass on the right. No thrombus. Left jugular vein narrowed but patent. 5. These results were called by telephone at the time of interpretation on 11/25/2021 at 3:49 pm [facility] who verbally acknowledged these results. Electronically Signed   By: CFranchot GalloM.D.   On: 11/29/2021 15:50    Microbiology Recent Results (from the past 240 hour(s))  Culture, Respiratory w Gram Stain     Status: None (Preliminary result)   Collection Time: 12/02/21  9:46 AM   Specimen: Tracheal Aspirate; Respiratory  Result  Value Ref Range Status   Specimen Description   Final    TRACHEAL ASPIRATE Performed at [facility], [address] [place], [place] [address]    Special Requests   Final    NONE Performed at [facility], [address] [place], [place]  [id number]    Gram Stain   Final    MODERATE WBC PRESENT, PREDOMINANTLY PMN NO ORGANISMS SEEN    Culture   Final    NO GROWTH < 24 HOURS Performed at [facility] [address], [place], [place] [address]    Report Status PENDING  Incomplete    Lab Basic Metabolic Panel: Recent Labs  Lab 11/28/21 0314 11/29/21 0552 11/29/21 2203 11/30/21 0421 11/30/21 1901 12/01/21 0314 12/02/21 0755  NA 151* 144  --  146* 148* 148* 146*  K 3.2* 3.4*  --  3.8 4.7 4.0 4.2  CL 114* 111  --  112* 113* 113* 112*  CO2 31 30  --  '28 28 29 26  '$ GLUCOSE 136* 119*  --  92 81 92 83  BUN 30* 23*  --  '19 17 16 11  '$ CREATININE 0.73 0.58*  --  0.57* 0.32* 0.41* <0.30*  CALCIUM 10.5* 10.0  --  9.9 9.4 9.8 9.6  MG 2.7* 2.4  --  2.3  --  2.3 2.2  PHOS 1.7* 1.3* 2.3* 2.1*  --  1.5* 1.8*   Liver Function Tests: Recent Labs  Lab 12/01/21 0314  AST 58*  ALT 34  ALKPHOS 79  BILITOT 11.1*  PROT 5.9*  ALBUMIN 3.0*   No results for input(s): "LIPASE", "AMYLASE" in the last 168 hours. No results for input(s): "AMMONIA" in the last 168 hours. CBC: Recent Labs  Lab 11/29/21 0552 11/30/21 0421 12/01/21 0314 12/02/21 0755 12/02/21 1919  WBC 4.7 5.2 5.4 4.4 4.5  NEUTROABS 3.6 3.7 3.7 2.9 3.2  HGB 8.5* 8.7* 9.0* 9.9* 8.1*  HCT 25.9* 27.1* 27.6* 30.8* 24.7*  MCV 104.0* 104.6* 104.9* 105.5* 105.1*  PLT 38* 34* 31* 24* 26*   Cardiac Enzymes: No results for input(s): "CKTOTAL", "CKMB", "CKMBINDEX", "TROPONINI" in the last 168 hours. Sepsis Labs: Recent Labs  Lab 11/30/21 0421 12/01/21 0314 12/02/21 0755 12/02/21 1919  WBC 5.2 5.4 4.4 4.5    Procedures/Operations   11/17: CVC 11/17: [place] 11/17: Cleopatra Cedar, DNP, CCRN, FNP-C, AGACNP-BC Acute Care & Family Nurse Practitioner  [facility]  See Amion for personal pager PCCM on call pager [phone] until 7 am

## 2021-12-08 NOTE — Anesthesia Postprocedure Evaluation (Signed)
Anesthesia Post Note  Patient: Jordan Hayden  Procedure(s) Performed: AWAKE TRACHEOSTOMY DIRECT LARYNGOSCOPY WITH BIOPSIES  Patient location during evaluation: ICU Anesthesia Type: General Level of consciousness: sedated Pain management: pain level controlled Vital Signs Assessment: post-procedure vital signs reviewed and stable Respiratory status: respiratory function stable and patient on ventilator - see flowsheet for VS Cardiovascular status: stable Anesthetic complications: no   No notable events documented.   Last Vitals:  Vitals:   11/30/2021 1900 12/02/2021 1959  BP:    Pulse: (!) 102 (!) 109  Resp: 18 10  Temp:    SpO2: (!) 66% (!) 74%    Last Pain:  Vitals:   12/02/21 1720  TempSrc: Axillary  PainSc:                  [place] Ouch

## 2021-12-08 NOTE — Final Progress Note (Signed)
Patient transported to the morgue. Patient is off unit.

## 2021-12-08 DEATH — deceased

## 2022-05-09 ENCOUNTER — Other Ambulatory Visit: Payer: Medicare Other

## 2022-05-09 ENCOUNTER — Ambulatory Visit: Payer: Medicare Other | Admitting: Oncology

## 2022-06-22 IMAGING — US US ABDOMEN COMPLETE
1 series · 13 of 25 positions shown · non-contrast
Comparison: Ultrasound dated 07/18/2020.

CLINICAL DATA: Alcoholic cirrhosis.  Thrombocytopenia.

EXAM:
ABDOMEN ULTRASOUND COMPLETE

[Series 1: us abdomen complete · 0.31mm/px · 13 of 109 slices shown]
[im 1/109]
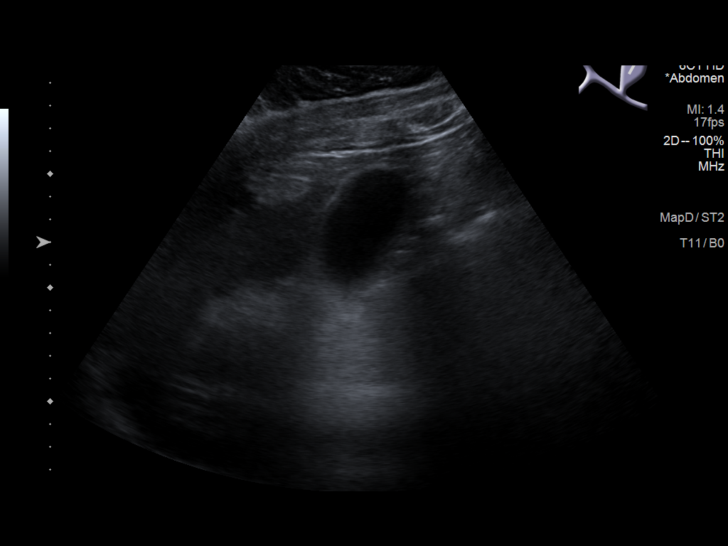
[im 10/109]
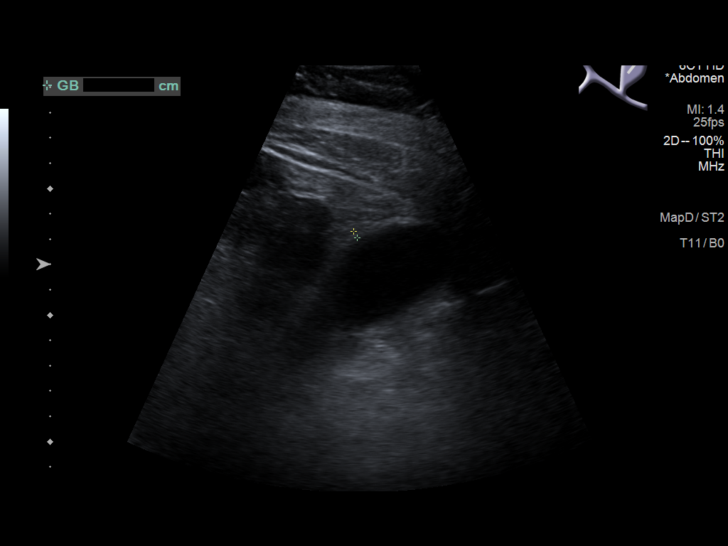
[im 19/109]
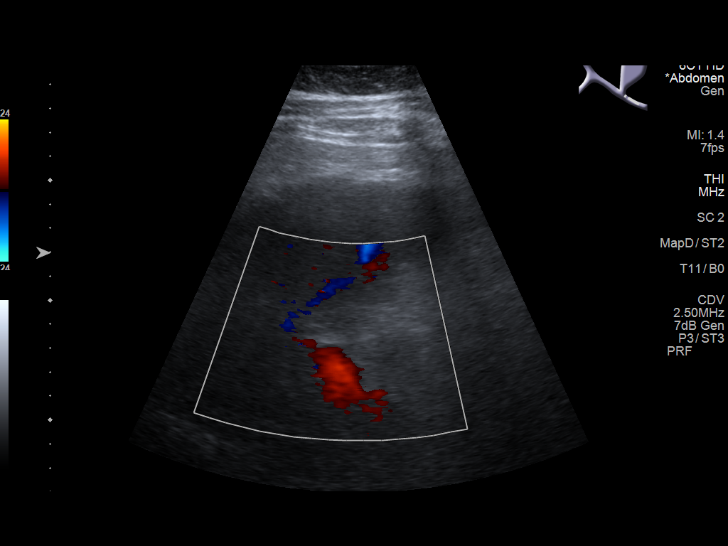
[im 28/109]
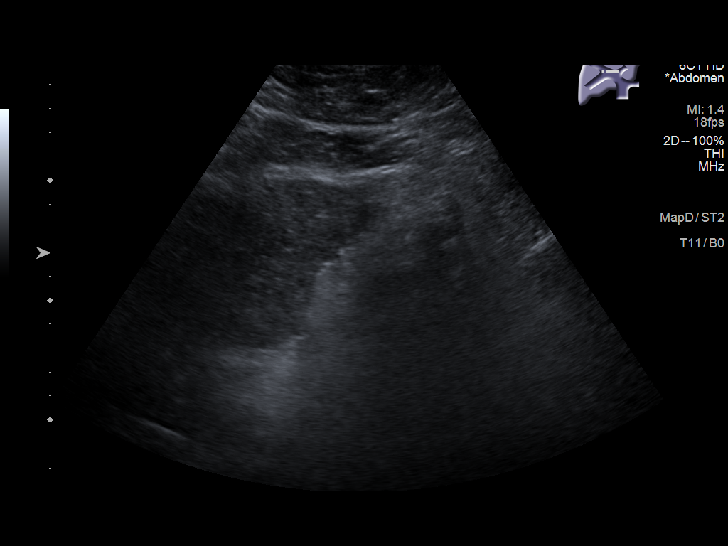
[im 37/109]
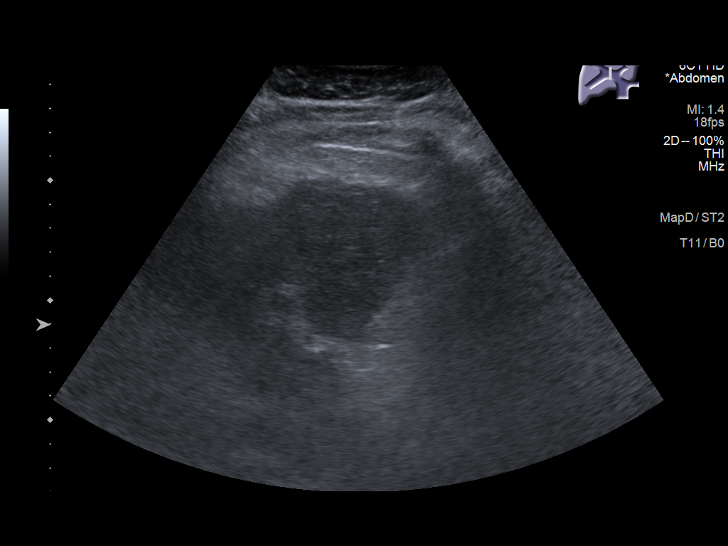
[im 46/109]
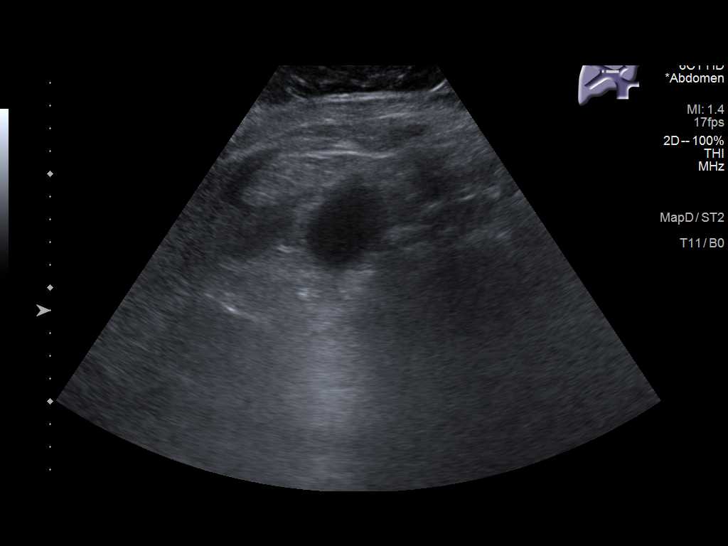
[im 55/109]
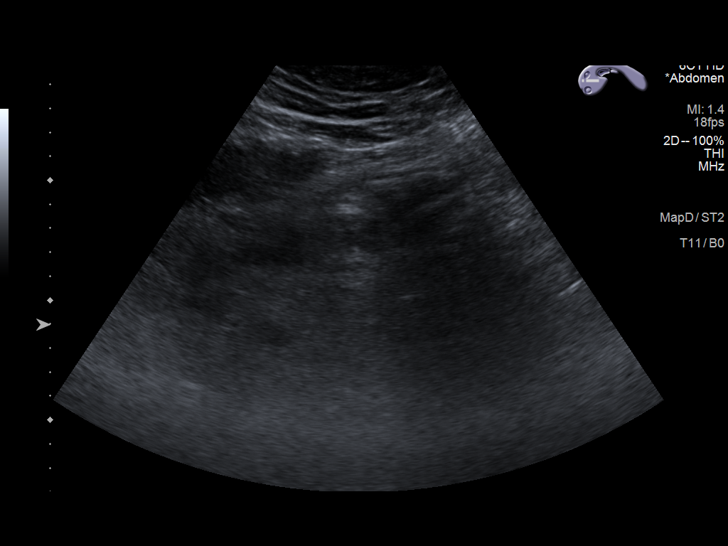
[im 64/109]
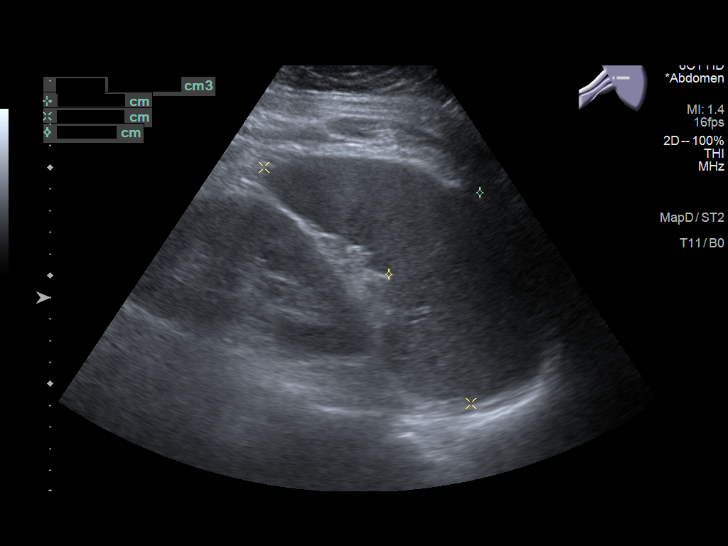
[im 73/109]
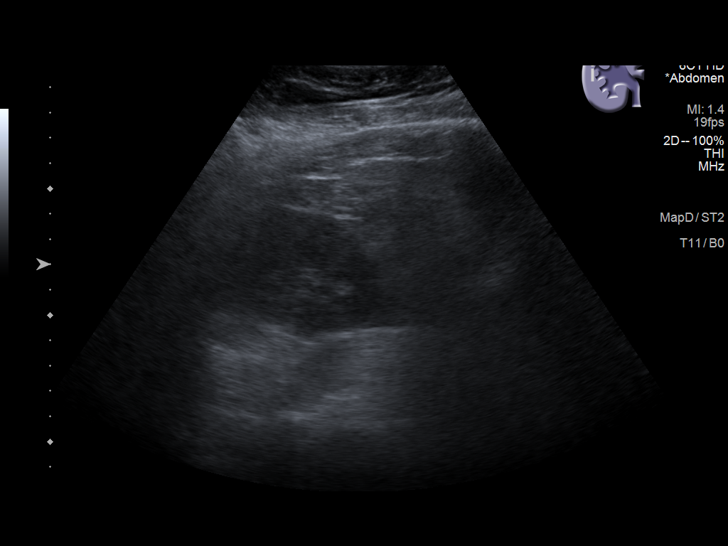
[im 82/109]
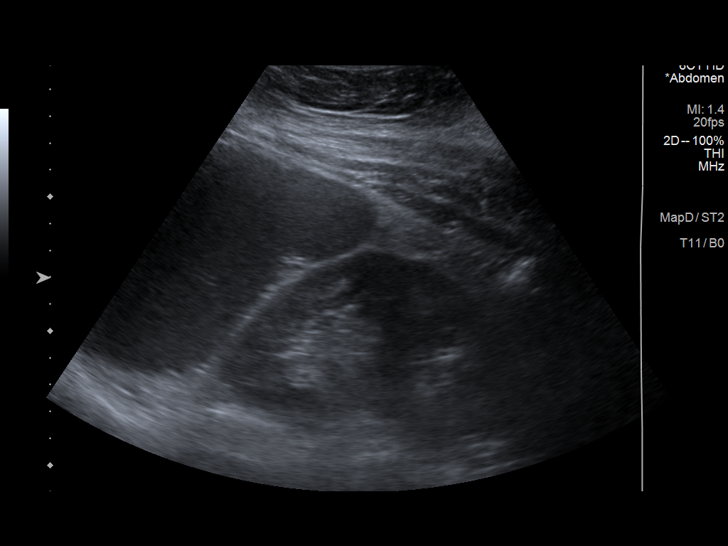
[im 91/109]
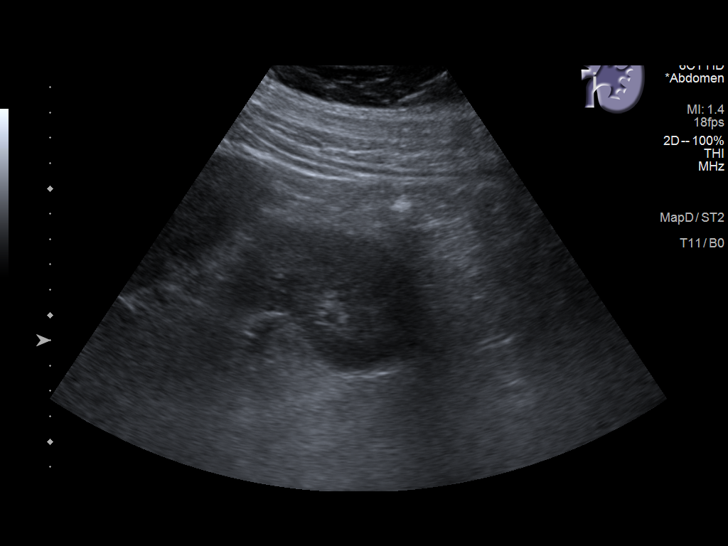
[im 100/109]
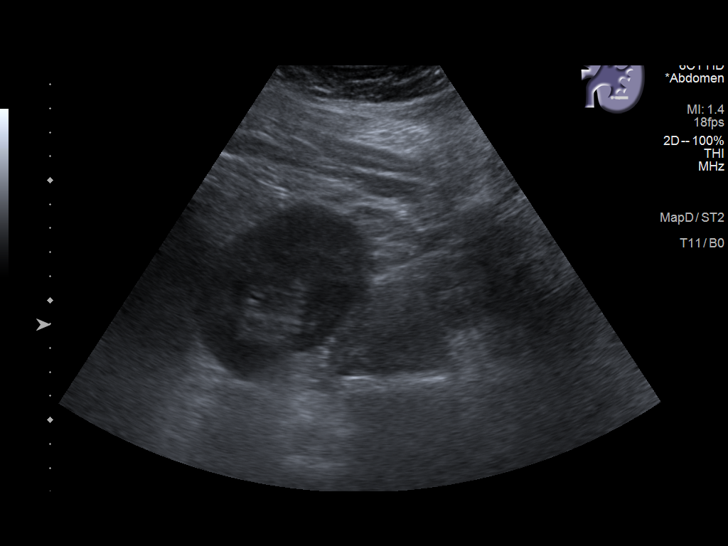
[im 109/109]
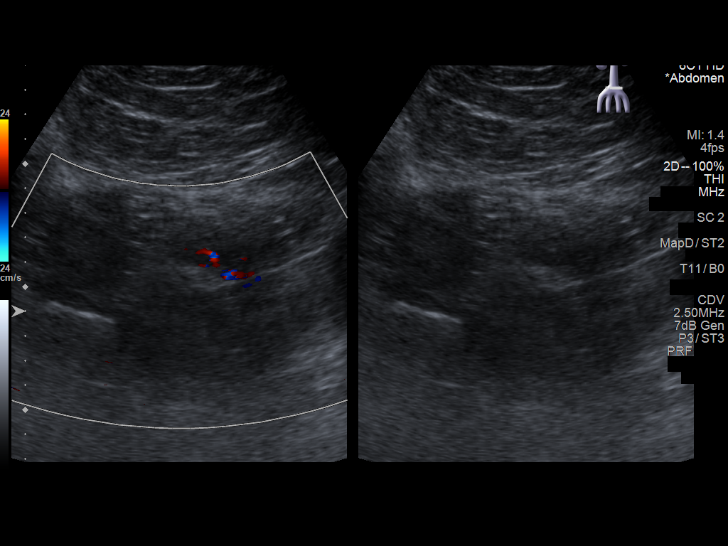

[13 of 25 positions shown; findings below may reference images not displayed]

FINDINGS: Gallbladder: No gallstones or wall thickening visualized. No
sonographic Murphy sign noted by sonographer.

Common bile duct: Diameter: 5 mm

Liver: Morphologic changes of cirrhosis. Portal vein is patent on
color Doppler imaging with normal direction of blood flow towards
the liver.

IVC: No abnormality visualized.

Pancreas: Not visualized and obscured by bowel gas.

Spleen: Top-normal spleen size measuring 13.8 cm in length. The
splenic volume is 590 cc.

Right Kidney: Length: 10 cm. Echogenicity within normal limits. No
mass or hydronephrosis visualized.

Left Kidney: Length: 11 cm. Echogenicity within normal limits. No
mass or hydronephrosis visualized.

Abdominal aorta: Very limited evaluation due to poor visualization.
The aorta appears aneurysmal measuring up to 3 cm.

Other findings: Small perihepatic ascites.
IMPRESSION: 1. Cirrhosis with small ascites.
2. Patent main portal vein with hepatopetal flow.
3. Borderline splenomegaly.
4. A 3 cm abdominal aortic aneurysm. Recommend follow-up ultrasound
every 3 years. This recommendation follows ACR consensus guidelines:
White Paper of the ACR Incidental Findings Committee II on Vascular
Findings. [HOSPITAL] 3277; [DATE].
